# Patient Record
Sex: Female | Born: 1955 | Race: White | Hispanic: No | Smoking: Never smoker
Health system: Southern US, Community
[De-identification: ages and names within clinical notes are randomized; demographics above are authoritative.]

## PROBLEM LIST (undated history)

## (undated) DIAGNOSIS — R011 Cardiac murmur, unspecified: Secondary | ICD-10-CM

## (undated) DIAGNOSIS — I499 Cardiac arrhythmia, unspecified: Secondary | ICD-10-CM

## (undated) DIAGNOSIS — E669 Obesity, unspecified: Secondary | ICD-10-CM

## (undated) DIAGNOSIS — IMO0001 Reserved for inherently not codable concepts without codable children: Secondary | ICD-10-CM

## (undated) DIAGNOSIS — I350 Nonrheumatic aortic (valve) stenosis: Secondary | ICD-10-CM

## (undated) DIAGNOSIS — I10 Essential (primary) hypertension: Secondary | ICD-10-CM

## (undated) DIAGNOSIS — E66811 Obesity, class 1: Secondary | ICD-10-CM

## (undated) DIAGNOSIS — I509 Heart failure, unspecified: Secondary | ICD-10-CM

## (undated) HISTORY — PX: TUBAL LIGATION: SHX77

## (undated) HISTORY — DX: Cardiac arrhythmia, unspecified: I49.9

## (undated) HISTORY — DX: Heart failure, unspecified: I50.9

## (undated) HISTORY — PX: HERNIA REPAIR: SHX51

## (undated) HISTORY — DX: Cardiac murmur, unspecified: R01.1

## (undated) HISTORY — DX: Essential (primary) hypertension: I10

## (undated) HISTORY — PX: CHOLECYSTECTOMY: SHX55

## (undated) HISTORY — DX: Nonrheumatic aortic (valve) stenosis: I35.0

---

## 2001-02-16 ENCOUNTER — Encounter: Payer: Self-pay | Admitting: Emergency Medicine

## 2001-02-16 ENCOUNTER — Inpatient Hospital Stay (HOSPITAL_COMMUNITY): Admission: EM | Admit: 2001-02-16 | Discharge: 2001-02-20 | Payer: Self-pay | Admitting: Emergency Medicine

## 2001-02-17 ENCOUNTER — Encounter: Payer: Self-pay | Admitting: General Surgery

## 2001-02-19 ENCOUNTER — Encounter: Payer: Self-pay | Admitting: General Surgery

## 2001-11-02 ENCOUNTER — Emergency Department (HOSPITAL_COMMUNITY): Admission: EM | Admit: 2001-11-02 | Discharge: 2001-11-02 | Payer: Self-pay | Admitting: Emergency Medicine

## 2001-11-02 ENCOUNTER — Encounter: Payer: Self-pay | Admitting: Emergency Medicine

## 2005-09-19 ENCOUNTER — Emergency Department (HOSPITAL_COMMUNITY): Admission: EM | Admit: 2005-09-19 | Discharge: 2005-09-19 | Payer: Self-pay | Admitting: Emergency Medicine

## 2012-03-20 DIAGNOSIS — IMO0001 Reserved for inherently not codable concepts without codable children: Secondary | ICD-10-CM

## 2012-03-20 HISTORY — DX: Reserved for inherently not codable concepts without codable children: IMO0001

## 2012-03-24 ENCOUNTER — Encounter (HOSPITAL_COMMUNITY): Payer: Self-pay | Admitting: Emergency Medicine

## 2012-03-24 ENCOUNTER — Inpatient Hospital Stay (HOSPITAL_COMMUNITY)
Admission: EM | Admit: 2012-03-24 | Discharge: 2012-04-14 | DRG: 870 | Payer: MEDICAID | Attending: Pulmonary Disease | Admitting: Pulmonary Disease

## 2012-03-24 ENCOUNTER — Emergency Department (HOSPITAL_COMMUNITY): Payer: Self-pay

## 2012-03-24 ENCOUNTER — Emergency Department (INDEPENDENT_AMBULATORY_CARE_PROVIDER_SITE_OTHER)
Admission: EM | Admit: 2012-03-24 | Discharge: 2012-03-24 | Disposition: A | Discharge status: Nursing Home (Includes ICF) | Payer: Self-pay | Source: Home / Self Care | Attending: Emergency Medicine | Admitting: Emergency Medicine

## 2012-03-24 ENCOUNTER — Emergency Department (INDEPENDENT_AMBULATORY_CARE_PROVIDER_SITE_OTHER): Payer: Self-pay

## 2012-03-24 DIAGNOSIS — D649 Anemia, unspecified: Secondary | ICD-10-CM | POA: Diagnosis present

## 2012-03-24 DIAGNOSIS — R0902 Hypoxemia: Secondary | ICD-10-CM

## 2012-03-24 DIAGNOSIS — R5383 Other fatigue: Secondary | ICD-10-CM

## 2012-03-24 DIAGNOSIS — R197 Diarrhea, unspecified: Secondary | ICD-10-CM | POA: Diagnosis present

## 2012-03-24 DIAGNOSIS — I10 Essential (primary) hypertension: Secondary | ICD-10-CM | POA: Diagnosis present

## 2012-03-24 DIAGNOSIS — E669 Obesity, unspecified: Secondary | ICD-10-CM | POA: Diagnosis present

## 2012-03-24 DIAGNOSIS — R5381 Other malaise: Secondary | ICD-10-CM | POA: Diagnosis not present

## 2012-03-24 DIAGNOSIS — E139 Other specified diabetes mellitus without complications: Secondary | ICD-10-CM | POA: Diagnosis present

## 2012-03-24 DIAGNOSIS — J101 Influenza due to other identified influenza virus with other respiratory manifestations: Secondary | ICD-10-CM | POA: Diagnosis present

## 2012-03-24 DIAGNOSIS — E8779 Other fluid overload: Secondary | ICD-10-CM | POA: Diagnosis not present

## 2012-03-24 DIAGNOSIS — D72819 Decreased white blood cell count, unspecified: Secondary | ICD-10-CM | POA: Insufficient documentation

## 2012-03-24 DIAGNOSIS — J9600 Acute respiratory failure, unspecified whether with hypoxia or hypercapnia: Secondary | ICD-10-CM | POA: Diagnosis not present

## 2012-03-24 DIAGNOSIS — N179 Acute kidney failure, unspecified: Secondary | ICD-10-CM | POA: Diagnosis not present

## 2012-03-24 DIAGNOSIS — R1310 Dysphagia, unspecified: Secondary | ICD-10-CM | POA: Diagnosis not present

## 2012-03-24 DIAGNOSIS — J09X1 Influenza due to identified novel influenza A virus with pneumonia: Secondary | ICD-10-CM | POA: Diagnosis present

## 2012-03-24 DIAGNOSIS — F411 Generalized anxiety disorder: Secondary | ICD-10-CM | POA: Diagnosis present

## 2012-03-24 DIAGNOSIS — J80 Acute respiratory distress syndrome: Secondary | ICD-10-CM | POA: Diagnosis present

## 2012-03-24 DIAGNOSIS — R05 Cough: Secondary | ICD-10-CM

## 2012-03-24 DIAGNOSIS — E870 Hyperosmolality and hypernatremia: Secondary | ICD-10-CM | POA: Diagnosis present

## 2012-03-24 DIAGNOSIS — D509 Iron deficiency anemia, unspecified: Secondary | ICD-10-CM | POA: Diagnosis present

## 2012-03-24 DIAGNOSIS — R6520 Severe sepsis without septic shock: Secondary | ICD-10-CM | POA: Diagnosis present

## 2012-03-24 DIAGNOSIS — N289 Disorder of kidney and ureter, unspecified: Secondary | ICD-10-CM | POA: Diagnosis not present

## 2012-03-24 DIAGNOSIS — R112 Nausea with vomiting, unspecified: Secondary | ICD-10-CM

## 2012-03-24 DIAGNOSIS — J189 Pneumonia, unspecified organism: Secondary | ICD-10-CM | POA: Insufficient documentation

## 2012-03-24 DIAGNOSIS — R531 Weakness: Secondary | ICD-10-CM

## 2012-03-24 DIAGNOSIS — Z6841 Body Mass Index (BMI) 40.0 and over, adult: Secondary | ICD-10-CM

## 2012-03-24 DIAGNOSIS — E876 Hypokalemia: Secondary | ICD-10-CM | POA: Diagnosis not present

## 2012-03-24 DIAGNOSIS — IMO0002 Reserved for concepts with insufficient information to code with codable children: Secondary | ICD-10-CM | POA: Diagnosis not present

## 2012-03-24 DIAGNOSIS — R6521 Severe sepsis with septic shock: Secondary | ICD-10-CM | POA: Diagnosis present

## 2012-03-24 DIAGNOSIS — R739 Hyperglycemia, unspecified: Secondary | ICD-10-CM | POA: Diagnosis not present

## 2012-03-24 DIAGNOSIS — J9601 Acute respiratory failure with hypoxia: Secondary | ICD-10-CM

## 2012-03-24 DIAGNOSIS — G9349 Other encephalopathy: Secondary | ICD-10-CM | POA: Diagnosis not present

## 2012-03-24 DIAGNOSIS — J069 Acute upper respiratory infection, unspecified: Secondary | ICD-10-CM

## 2012-03-24 DIAGNOSIS — R061 Stridor: Secondary | ICD-10-CM | POA: Diagnosis not present

## 2012-03-24 DIAGNOSIS — A419 Sepsis, unspecified organism: Principal | ICD-10-CM | POA: Diagnosis present

## 2012-03-24 DIAGNOSIS — T380X5A Adverse effect of glucocorticoids and synthetic analogues, initial encounter: Secondary | ICD-10-CM | POA: Diagnosis present

## 2012-03-24 HISTORY — DX: Reserved for inherently not codable concepts without codable children: IMO0001

## 2012-03-24 HISTORY — DX: Obesity, unspecified: E66.9

## 2012-03-24 HISTORY — DX: Obesity, class 1: E66.811

## 2012-03-24 HISTORY — DX: Anemia, unspecified: D64.9

## 2012-03-24 LAB — CBC WITH DIFFERENTIAL/PLATELET
Basophils Absolute: 0 10*3/uL (ref 0.0–0.1)
Lymphs Abs: 0.5 10*3/uL — ABNORMAL LOW (ref 0.7–4.0)
MCV: 62.6 fL — ABNORMAL LOW (ref 78.0–100.0)
Monocytes Absolute: 0.2 10*3/uL (ref 0.1–1.0)
Monocytes Relative: 7 % (ref 3–12)
Neutrophils Relative %: 75 % (ref 43–77)
Platelets: 220 10*3/uL (ref 150–400)
RDW: 20.1 % — ABNORMAL HIGH (ref 11.5–15.5)
WBC: 2.8 10*3/uL — ABNORMAL LOW (ref 4.0–10.5)

## 2012-03-24 LAB — URINALYSIS, ROUTINE W REFLEX MICROSCOPIC
Hgb urine dipstick: NEGATIVE
Nitrite: NEGATIVE
Protein, ur: 100 mg/dL — AB
Urobilinogen, UA: 2 mg/dL — ABNORMAL HIGH (ref 0.0–1.0)

## 2012-03-24 LAB — BASIC METABOLIC PANEL
Calcium: 8.3 mg/dL — ABNORMAL LOW (ref 8.4–10.5)
Chloride: 101 mEq/L (ref 96–112)
Creatinine, Ser: 1.06 mg/dL (ref 0.50–1.10)
GFR calc Af Amer: 67 mL/min — ABNORMAL LOW (ref >90)

## 2012-03-24 LAB — OCCULT BLOOD, POC DEVICE: Fecal Occult Bld: NEGATIVE

## 2012-03-24 LAB — LIPASE, BLOOD: Lipase: 48 U/L (ref 11–59)

## 2012-03-24 LAB — TROPONIN I: Troponin I: <0.3 ng/mL (ref <0.30)

## 2012-03-24 MED ORDER — ACETAMINOPHEN 325 MG PO TABS
ORAL_TABLET | ORAL | Status: AC
  Filled 2012-03-24: qty 1

## 2012-03-24 MED ORDER — POTASSIUM CHLORIDE CRYS ER 20 MEQ PO TBCR
40.0000 meq | EXTENDED_RELEASE_TABLET | Freq: Once | ORAL | Status: AC
  Administered 2012-03-24: 40 meq via ORAL
  Filled 2012-03-24: qty 2

## 2012-03-24 MED ORDER — ALBUTEROL SULFATE (5 MG/ML) 0.5% IN NEBU
2.5000 mg | INHALATION_SOLUTION | Freq: Once | RESPIRATORY_TRACT | Status: AC
  Administered 2012-03-24: 2.5 mg via RESPIRATORY_TRACT

## 2012-03-24 MED ORDER — ACETAMINOPHEN 325 MG PO TABS
650.0000 mg | ORAL_TABLET | Freq: Once | ORAL | Status: AC
  Administered 2012-03-24: 650 mg via ORAL

## 2012-03-24 MED ORDER — NON FORMULARY: 400.0000 mg | Freq: Every day | Status: DC

## 2012-03-24 MED ORDER — LEVOFLOXACIN IN D5W 750 MG/150ML IV SOLN
750.0000 mg | Freq: Once | INTRAVENOUS | Status: AC
  Administered 2012-03-24: 750 mg via INTRAVENOUS
  Filled 2012-03-24: qty 150

## 2012-03-24 MED ORDER — IPRATROPIUM BROMIDE 0.02 % IN SOLN
0.5000 mg | Freq: Once | RESPIRATORY_TRACT | Status: AC
  Administered 2012-03-24: 0.5 mg via RESPIRATORY_TRACT

## 2012-03-24 MED ORDER — ALBUTEROL SULFATE (5 MG/ML) 0.5% IN NEBU
INHALATION_SOLUTION | RESPIRATORY_TRACT | Status: AC
  Filled 2012-03-24: qty 0.5

## 2012-03-24 MED ORDER — SODIUM CHLORIDE 0.9 % IV BOLUS (SEPSIS)
1000.0000 mL | Freq: Once | INTRAVENOUS | Status: AC
  Administered 2012-03-24: 1000 mL via INTRAVENOUS

## 2012-03-24 NOTE — ED Notes (Signed)
WENT TO GET PATIENT FOR CXR, PATIENT JUST STARTED BREATHING TREATMENT

## 2012-03-24 NOTE — ED Notes (Signed)
O2 4 L /Williams, monitor shows sinus tachycardia.

## 2012-03-24 NOTE — ED Notes (Signed)
carelink not available. [facility] notified. Mw,cma

## 2012-03-24 NOTE — ED Provider Notes (Signed)
History     CSN: [id number]  Arrival date & time 03/24/12  1552   None     Chief Complaint  Patient presents with  . URI    dry non productive cough, body aches, n/v/d    (Consider location/radiation/quality/duration/timing/severity/associated sxs/prior treatment) Patient is a 56 y.o. female presenting with cough. The history is provided by the patient.  Cough This is a new problem. The current episode started more than 2 days ago (4 days ago). The problem occurs hourly. The problem has not changed since onset.The cough is non-productive. The maximum temperature recorded prior to her arrival was 103 to 104 F. Associated symptoms include chest pain, chills, sweats, headaches, myalgias, shortness of breath and wheezing. She has tried decongestants and cough syrup (otc robitussin, alka seltzer plus) for the symptoms. The treatment provided mild relief. Risk factors: no recent travel, dogs at home, no chemical exposure. She is not a smoker. Her past medical history does not include bronchitis, pneumonia, bronchiectasis, COPD, emphysema or asthma.  Pt reports she has been weak and resting in bed since Saturday, otc medication do not improve symptoms.   History reviewed. No pertinent past medical history.  Past Surgical History  Procedure Date  . Cholecystectomy   . Hernia repair     History reviewed. No pertinent family history.  History  Substance Use Topics  . Smoking status: Never Smoker   . Smokeless tobacco: Not on file  . Alcohol Use: Yes     Comment: occasional    OB History    Grav Para Term Preterm Abortions TAB SAB Ect Mult Living                  Review of Systems  Constitutional: Positive for chills.  Respiratory: Positive for cough, shortness of breath and wheezing.   Cardiovascular: Positive for chest pain.  Gastrointestinal: Positive for nausea.  Musculoskeletal: Positive for myalgias.  Neurological: Positive for headaches.  All other systems reviewed and  are negative.    Allergies  Review of patient's allergies indicates no known allergies.  Home Medications   Current Outpatient Rx  Name  Route  Sig  Dispense  Refill  . PRILOSEC PO   Oral   Take by mouth.           BP 135/79  Pulse 117  Temp 101.4 F (38.6 C) (Oral)  Resp 30  SpO2 95%  Physical Exam  Nursing note and vitals reviewed. Constitutional: She appears well-developed and well-nourished. No distress.  HENT:  Head: Normocephalic.  Right Ear: External ear normal.  Left Ear: External ear normal.  Nose: Nose normal.  Mouth/Throat: Oropharynx is clear and moist. No oropharyngeal exudate.  Eyes: Conjunctivae normal are normal. Pupils are equal, round, and reactive to light.  Neck: Normal range of motion. Neck supple.  Cardiovascular: Normal heart sounds, intact distal pulses and normal pulses.  Tachycardia present.   Pulmonary/Chest: No accessory muscle usage. Tachypnea noted. No respiratory distress. She has decreased breath sounds.  Abdominal: Soft. Bowel sounds are normal. There is no tenderness.  Lymphadenopathy:       Head (right side): No submental, no submandibular, no tonsillar, no preauricular, no posterior auricular and no occipital adenopathy present.       Head (left side): No submental, no submandibular, no tonsillar, no preauricular, no posterior auricular and no occipital adenopathy present.    She has no cervical adenopathy.  Neurological: She is alert. She has normal strength. No cranial nerve deficit or sensory  deficit. Coordination and gait normal. GCS eye subscore is 4. GCS verbal subscore is 5. GCS motor subscore is 6.  Skin: She is not diaphoretic.    ED Course  Procedures (including critical care time)  Labs Reviewed - No data to display Dg Chest 1 View  03/24/2012  *RADIOLOGY REPORT*  Clinical Data: Shortness of breath.  Dizziness.  CHEST - 1 VIEW  Comparison: None.  Findings: Bilateral heterogeneous air space disease is seen in the mid  and lower lung zones, left side greater than right.  The differential diagnosis includes infection, edema, and hemorrhage.  No evidence of pleural effusion.  Heart size is normal.  Low lung volumes are noted.  IMPRESSION: Bilateral heterogeneous air space disease, left side greater than right.  Differential diagnosis includes pneumonia, edema, and hemorrhage.   Original Report Authenticated By: Myles Rosenthal, M.D.     EKG-tachycardia, ST and T wave abnormality  1. Hypoxia   2. Cough   3. Weakness       MDM  Tylenol and Albuterol/atrovent neb treatment administered in office.  1838 re-eval, rhonchi in right lower field, 02 sats 93% on room air while sitting. 1846 attempt to obtain xray by xray tech, pt unable to tolerate related to near syncope episode x 2, one view obtained only. Will transfer via carelink to [facility] for further evaluation and treatment.         Johnsie Kindred, NP 03/24/12 1925

## 2012-03-24 NOTE — H&P (Signed)
PCP:   none   Chief Complaint:  cough  HPI: This is a 56 year old female who states she's been feeling ill since Saturday. She states she's had a nonproductive cough. No hemoptysis. Her cough has been severe enough there is no causing her chest and back to her hurt.she denies any shortness of breath or wheezing. She states she's had a temperature of 102 Max, she's had mild chills but no nausea or vomiting. She denies feeling weak, she denies any myalgia. She denies any blood in stool or urine. She denies any lower extremity edema or any weight gain or loss. She denies any chest pain. She states she does have history of anemia but that was on during pregnancy, she's never had a blood transfusion. Patient is postmenopausal for 6-7 years, she's never had a colonoscopy. No history of blood in her stool, she states her stool is brown. Her over-the-counter medications are Robitussin, Alka-Seltzer and ibuprofen. She does have a son with Ewing sarcoma who was treated at [facility]. She's been stressed going back and forth with him. There is no history of any weight loss. History provided by the patient is alert and oriented.patient initially to urgent care Center, however she had 2 presyncopal episodes while thereas he took her to get a chest x-ray done. She was sent to [facility]. History obtained from patient.  Review of Systems:  The patient denies anorexia, weight loss,, vision loss, decreased hearing, hoarseness, chest pain, syncope, dyspnea on exertion, peripheral edema, balance deficits, hemoptysis, abdominal pain, melena, hematochezia, severe indigestion/heartburn, hematuria, incontinence, genital sores, muscle weakness, suspicious skin lesions, transient blindness, difficulty walking, depression, unusual weight change, abnormal bleeding, enlarged lymph nodes, angioedema, and breast masses.  Past Medical History: Past Medical History  Diagnosis Date  . Obesity (BMI 30.0-34.9)    Past Surgical History   Procedure Date  . Cholecystectomy   . Hernia repair     Medications: Prior to Admission medications   Medication Sig Start Date End Date Taking? Authorizing Provider  Omeprazole (PRILOSEC PO) Take 1 tablet by mouth daily.    Yes Historical Provider, MD    Allergies:   Allergies  Allergen Reactions  . Penicillins Swelling  . Codeine Nausea And Vomiting    Social History:  reports that she has never smoked. She does not have any smokeless tobacco history on file. She reports that she drinks alcohol. She reports that she does not use illicit drugs.  Family History: Family History  Problem Relation Age of Onset  . Hypertension      Physical Exam: Filed Vitals:   03/24/12 1943 03/24/12 2256  BP:  126/76  Pulse: 97 91  Resp: 32 30  SpO2: 95% 93%    General:  Alert and oriented times three, morbidly obese no acute distress Eyes: PERRLA, pale conjunctiva, no scleral icterus ENT: Moist oral mucosa, neck supple, no thyromegaly Lungs: clear to ascultation, no wheeze, no crackles, no use of accessory muscles Cardiovascular: regular rate and rhythm, no regurgitation, no gallops, no murmurs. No carotid bruits, no JVD Abdomen: soft, positive BS, non-tender, non-distended, no organomegaly, not an acute abdomen GU: not examined Neuro: CN II - XII grossly intact, sensation intact Musculoskeletal: strength 5/5 all extremities, no clubbing, cyanosis or edema Skin: no rash, no subcutaneous crepitation, no decubitus Psych: appropriate patient   Labs on Admission:   [facility] [place] 03/24/12 2021  NA 139  K 3.1*  CL 101  CO2 27  GLUCOSE 115*  BUN 14  CREATININE 1.06  CALCIUM  8.3*  MG --  PHOS --   No results found for this basename: AST:2,ALT:2,ALKPHOS:2,BILITOT:2,PROT:2,ALBUMIN:2 in the last 72 hours  Basename 03/24/12 2021  LIPASE 48  AMYLASE --    Basename 03/24/12 2021  WBC 2.8*  NEUTROABS 2.1  HGB 7.7*  HCT 25.3*  MCV 62.6*  PLT 220    Basename 03/24/12 2021   CKTOTAL --  CKMB --  CKMBINDEX --  TROPONINI <0.30     Basename 03/24/12 2323  VITAMINB12 --  FOLATE --  FERRITIN --  TIBC --  IRON --  RETICCTPCT 0.3*    Micro Results: No results found for this or any previous visit (from the past 240 hour(s)).   Radiological Exams on Admission: Dg Chest 1 View  03/24/2012  *RADIOLOGY REPORT*  Clinical Data: Chest pain and shortness of breath.  CHEST - 1 VIEW  Comparison: Chest radiograph performed earlier today at 06:41 p.m.  Findings: Basilar opacity and additional midlung opacity is seen on lateral chest radiograph, corresponding to the bilateral airspace opacities noted on the PA radiograph.  Findings are more suspicious for pneumonia, though edema could conceivably have a similar appearance.  Hemorrhage is considered less likely.  The cardiomediastinal silhouette is borderline normal in size.  No acute osseous abnormalities are seen.  IMPRESSION: Bilateral airspace opacities, suspicious for pneumonia.  Edema could conceivably have a similar appearance.   Original Report Authenticated By: Tonia Ghent, M.D.    Dg Chest 1 View  03/24/2012  *RADIOLOGY REPORT*  Clinical Data: Shortness of breath.  Dizziness.  CHEST - 1 VIEW  Comparison: None.  Findings: Bilateral heterogeneous air space disease is seen in the mid and lower lung zones, left side greater than right.  The differential diagnosis includes infection, edema, and hemorrhage.  No evidence of pleural effusion.  Heart size is normal.  Low lung volumes are noted.  IMPRESSION: Bilateral heterogeneous air space disease, left side greater than right.  Differential diagnosis includes pneumonia, edema, and hemorrhage.   Original Report Authenticated By: Myles Rosenthal, M.D.    EKG: Normal sinus rhythm  Assessment/Plan Present on Admission:  . Community acquired pneumonia/leukocytosis Admit to telemetry Will start patient on antibiotics Levaquin. Tessalon Perles, Nebulizers as needed and oxygen as  needed No evidence of hemoptysis. Will obtain a CT chest with contrast  Blood cultures and sputum cultures ordered Anemia Unclear etiology. Patient is postmenopausal, gives no history of blood in the stool, not coughing up blood no blood in the urine. Anemia panel ordered and will guaiac stool. Will order CEA, patient encouraged to get a colonoscopy done Will transfuse for symptomatic anemia as patient is presyncopal. Obesity  Full code SCDs for DVT prophylaxis  Maniyah Moller 03/24/2012, 11:47 PM

## 2012-03-24 NOTE — ED Provider Notes (Signed)
History     CSN: [id number]  Arrival date & time 03/24/12  8295   First MD Initiated Contact with Patient 03/24/12 2018      Chief Complaint  Patient presents with  . Near Syncope    (Consider location/radiation/quality/duration/timing/severity/associated sxs/prior treatment) The history is provided by the patient, the spouse and medical records.   Megan Espinoza is a 55 y.o. female  with no past medical history presents to the Emergency Department from the urgent care complaining of sudden onset, resolved nursing about this and onset 30 minutes ago.  Patient states she presented to the urgent care because she thought she might have pneumonia as she's had upper respiratory symptoms for almost one week.  Associated symptoms include fever, chills, fatigue, appetite loss, headache, chest tightness, cough, congestion, nausea, vomiting, diarrhea.  At the urgent care patient stood up her chest x-ray taken and became sweaty, pale and very lightheaded.  Patient states she thought she might pass out at that time.  Nothing makes it better and nothing makes it worse.  Pt denies neck pain, abdominal pain, dysuria, hematuria, urgency, frequency, bloody stools, hemoptysis.     Past Medical History  Diagnosis Date  . Obesity (BMI 30.0-34.9)     Past Surgical History  Procedure Date  . Cholecystectomy   . Hernia repair     Family History  Problem Relation Age of Onset  . Hypertension      History  Substance Use Topics  . Smoking status: Never Smoker   . Smokeless tobacco: Not on file  . Alcohol Use: Yes     Comment: occasional    OB History    Grav Para Term Preterm Abortions TAB SAB Ect Mult Living                  Review of Systems  Constitutional: Positive for fever, chills, appetite change and fatigue. Negative for diaphoresis and unexpected weight change.  HENT: Negative for mouth sores, neck pain and neck stiffness.   Eyes: Negative for visual disturbance.    Respiratory: Positive for cough, chest tightness and shortness of breath. Negative for wheezing.   Cardiovascular: Negative for chest pain.  Gastrointestinal: Positive for nausea, vomiting and diarrhea. Negative for abdominal pain and constipation.  Genitourinary: Negative for dysuria, urgency, frequency and hematuria.  Musculoskeletal: Positive for back pain.  Skin: Positive for pallor. Negative for rash and wound.  Neurological: Negative for syncope, light-headedness and headaches.  Hematological: Does not bruise/bleed easily.  Psychiatric/Behavioral: Negative for sleep disturbance. The patient is not nervous/anxious.     Allergies  Penicillins and Codeine  Home Medications   Current Outpatient Rx  Name  Route  Sig  Dispense  Refill  . PRILOSEC PO   Oral   Take 1 tablet by mouth daily.            BP 119/64  Pulse 92  Temp 98.9 F (37.2 C) (Oral)  Resp 18  Ht 5\' 5"  (1.651 m)  Wt 270 lb (122.471 kg)  BMI 44.93 kg/m2  SpO2 96%  Physical Exam  Nursing note and vitals reviewed. Constitutional: She is oriented to person, place, and time. She appears well-developed and well-nourished. No distress.  HENT:  Head: Normocephalic and atraumatic.  Right Ear: Tympanic membrane, external ear and ear canal normal.  Left Ear: Tympanic membrane, external ear and ear canal normal.  Nose: Nose normal.  Mouth/Throat: Uvula is midline and oropharynx is clear and moist. Mucous membranes are pale. No oropharyngeal  exudate.  Eyes: Conjunctivae normal and EOM are normal. Pupils are equal, round, and reactive to light. No scleral icterus.  Neck: Normal range of motion. Neck supple.  Cardiovascular: Normal rate, regular rhythm, normal heart sounds and intact distal pulses.  Exam reveals no gallop and no friction rub.   No murmur heard. Pulmonary/Chest: Accessory muscle usage present. Tachypnea noted. No respiratory distress. She has decreased breath sounds (throughout). She has wheezes  (scattered throughout).  Abdominal: Soft. Bowel sounds are normal. She exhibits no distension and no mass. There is no tenderness. There is no rebound and no guarding.       Obese  Genitourinary: Rectal exam shows no external hemorrhoid, no internal hemorrhoid, no fissure, no mass, no tenderness and anal tone normal. Guaiac negative stool.     Musculoskeletal: Normal range of motion. She exhibits no edema and no tenderness.  Lymphadenopathy:    She has no cervical adenopathy.  Neurological: She is alert and oriented to person, place, and time. She exhibits normal muscle tone. Coordination normal.       Speech is clear and goal oriented, follows commands Major Cranial nerves without deficit, no facial droop Normal strength in upper and lower extremities bilaterally including dorsiflexion and plantar flexion, strong and equal grip strength Sensation normal to light and sharp touch Moves extremities without ataxia, coordination intact Normal finger to nose and rapid alternating movements Neg modified romberg, no pronator drift   Skin: Skin is warm and dry. No rash noted. She is not diaphoretic. No erythema. There is pallor.  Psychiatric: She has a normal mood and affect.    ED Course  Procedures (including critical care time)  Labs Reviewed  URINALYSIS, ROUTINE W REFLEX MICROSCOPIC - Abnormal; Notable for the following:    Color, Urine AMBER (*)  BIOCHEMICALS MAY BE AFFECTED BY COLOR   APPearance CLOUDY (*)     Specific Gravity, Urine 1.034 (*)     Bilirubin Urine SMALL (*)     Ketones, ur 15 (*)     Protein, ur 100 (*)     Urobilinogen, UA 2.0 (*)     Leukocytes, UA SMALL (*)     All other components within normal limits  CBC WITH DIFFERENTIAL - Abnormal; Notable for the following:    WBC 2.8 (*)     Hemoglobin 7.7 (*)     HCT 25.3 (*)     MCV 62.6 (*)     MCH 19.1 (*)     RDW 20.1 (*)     Lymphs Abs 0.5 (*)     All other components within normal limits  BASIC METABOLIC  PANEL - Abnormal; Notable for the following:    Potassium 3.1 (*)     Glucose, Bld 115 (*)     Calcium 8.3 (*)     GFR calc non Af Amer 58 (*)     GFR calc Af Amer 67 (*)     All other components within normal limits  URINE MICROSCOPIC-ADD ON - Abnormal; Notable for the following:    Squamous Epithelial / LPF FEW (*)     Bacteria, UA FEW (*)     Casts HYALINE CASTS (*)     All other components within normal limits  PRO B NATRIURETIC PEPTIDE - Abnormal; Notable for the following:    Pro B Natriuretic peptide (BNP) 155.5 (*)     All other components within normal limits  RETICULOCYTES - Abnormal; Notable for the following:    Retic Ct Pct  0.3 (*)     RBC. 3.85 (*)     Retic Count, Manual 11.6 (*)     All other components within normal limits  TROPONIN I  LIPASE, BLOOD  TYPE AND SCREEN  OCCULT BLOOD, POC DEVICE  URINE CULTURE  CULTURE, BLOOD (ROUTINE X 2)  CULTURE, BLOOD (ROUTINE X 2)  OCCULT BLOOD X 1 CARD TO LAB, STOOL  VITAMIN B12  FOLATE  IRON AND TIBC  FERRITIN  ABO/RH   Dg Chest 1 View  03/24/2012  *RADIOLOGY REPORT*  Clinical Data: Chest pain and shortness of breath.  CHEST - 1 VIEW  Comparison: Chest radiograph performed earlier today at 06:41 p.m.  Findings: Basilar opacity and additional midlung opacity is seen on lateral chest radiograph, corresponding to the bilateral airspace opacities noted on the PA radiograph.  Findings are more suspicious for pneumonia, though edema could conceivably have a similar appearance.  Hemorrhage is considered less likely.  The cardiomediastinal silhouette is borderline normal in size.  No acute osseous abnormalities are seen.  IMPRESSION: Bilateral airspace opacities, suspicious for pneumonia.  Edema could conceivably have a similar appearance.   Original Report Authenticated By: Tonia Ghent, M.D.    Dg Chest 1 View  03/24/2012  *RADIOLOGY REPORT*  Clinical Data: Shortness of breath.  Dizziness.  CHEST - 1 VIEW  Comparison: None.   Findings: Bilateral heterogeneous air space disease is seen in the mid and lower lung zones, left side greater than right.  The differential diagnosis includes infection, edema, and hemorrhage.  No evidence of pleural effusion.  Heart size is normal.  Low lung volumes are noted.  IMPRESSION: Bilateral heterogeneous air space disease, left side greater than right.  Differential diagnosis includes pneumonia, edema, and hemorrhage.   Original Report Authenticated By: Myles Rosenthal, M.D.    Results for orders placed during the hospital encounter of 03/24/12  TROPONIN I      Component Value Range   Troponin I <0.30  <0.30 ng/mL  URINALYSIS, ROUTINE W REFLEX MICROSCOPIC      Component Value Range   Color, Urine AMBER (*) YELLOW   APPearance CLOUDY (*) CLEAR   Specific Gravity, Urine 1.034 (*) 1.005 - 1.030   pH 5.5  5.0 - 8.0   Glucose, UA NEGATIVE  NEGATIVE mg/dL   Hgb urine dipstick NEGATIVE  NEGATIVE   Bilirubin Urine SMALL (*) NEGATIVE   Ketones, ur 15 (*) NEGATIVE mg/dL   Protein, ur 045 (*) NEGATIVE mg/dL   Urobilinogen, UA 2.0 (*) 0.0 - 1.0 mg/dL   Nitrite NEGATIVE  NEGATIVE   Leukocytes, UA SMALL (*) NEGATIVE  CBC WITH DIFFERENTIAL      Component Value Range   WBC 2.8 (*) 4.0 - 10.5 K/uL   RBC 4.04  3.87 - 5.11 MIL/uL   Hemoglobin 7.7 (*) 12.0 - 15.0 g/dL   HCT 40.9 (*) 81.1 - 91.4 %   MCV 62.6 (*) 78.0 - 100.0 fL   MCH 19.1 (*) 26.0 - 34.0 pg   MCHC 30.4  30.0 - 36.0 g/dL   RDW 78.2 (*) 95.6 - 21.3 %   Platelets 220  150 - 400 K/uL   Neutrophils Relative 75  43 - 77 %   Lymphocytes Relative 18  12 - 46 %   Monocytes Relative 7  3 - 12 %   Eosinophils Relative 0  0 - 5 %   Basophils Relative 0  0 - 1 %   Neutro Abs 2.1  1.7 - 7.7 K/uL  Lymphs Abs 0.5 (*) 0.7 - 4.0 K/uL   Monocytes Absolute 0.2  0.1 - 1.0 K/uL   Eosinophils Absolute 0.0  0.0 - 0.7 K/uL   Basophils Absolute 0.0  0.0 - 0.1 K/uL   RBC Morphology ELLIPTOCYTES    BASIC METABOLIC PANEL      Component Value Range     Sodium 139  135 - 145 mEq/L   Potassium 3.1 (*) 3.5 - 5.1 mEq/L   Chloride 101  96 - 112 mEq/L   CO2 27  19 - 32 mEq/L   Glucose, Bld 115 (*) 70 - 99 mg/dL   BUN 14  6 - 23 mg/dL   Creatinine, Ser 1.61  0.50 - 1.10 mg/dL   Calcium 8.3 (*) 8.4 - 10.5 mg/dL   GFR calc non Af Amer 58 (*) >90 mL/min   GFR calc Af Amer 67 (*) >90 mL/min  LIPASE, BLOOD      Component Value Range   Lipase 48  11 - 59 U/L  URINE MICROSCOPIC-ADD ON      Component Value Range   Squamous Epithelial / LPF FEW (*) RARE   WBC, UA 7-10  <3 WBC/hpf   RBC / HPF 3-6  <3 RBC/hpf   Bacteria, UA FEW (*) RARE   Casts HYALINE CASTS (*) NEGATIVE   Urine-Other MUCOUS PRESENT    PRO B NATRIURETIC PEPTIDE      Component Value Range   Pro B Natriuretic peptide (BNP) 155.5 (*) 0 - 125 pg/mL  TYPE AND SCREEN      Component Value Range   ABO/RH(D) O POS     Antibody Screen NEG     Sample Expiration 03/27/2012    RETICULOCYTES      Component Value Range   Retic Ct Pct 0.3 (*) 0.4 - 3.1 %   RBC. 3.85 (*) 3.87 - 5.11 MIL/uL   Retic Count, Manual 11.6 (*) 19.0 - 186.0 K/uL  OCCULT BLOOD, POC DEVICE      Component Value Range   Fecal Occult Bld NEGATIVE     Dg Chest 1 View  03/24/2012  *RADIOLOGY REPORT*  Clinical Data: Chest pain and shortness of breath.  CHEST - 1 VIEW  Comparison: Chest radiograph performed earlier today at 06:41 p.m.  Findings: Basilar opacity and additional midlung opacity is seen on lateral chest radiograph, corresponding to the bilateral airspace opacities noted on the PA radiograph.  Findings are more suspicious for pneumonia, though edema could conceivably have a similar appearance.  Hemorrhage is considered less likely.  The cardiomediastinal silhouette is borderline normal in size.  No acute osseous abnormalities are seen.  IMPRESSION: Bilateral airspace opacities, suspicious for pneumonia.  Edema could conceivably have a similar appearance.   Original Report Authenticated By: Tonia Ghent, M.D.     Dg Chest 1 View  03/24/2012  *RADIOLOGY REPORT*  Clinical Data: Shortness of breath.  Dizziness.  CHEST - 1 VIEW  Comparison: None.  Findings: Bilateral heterogeneous air space disease is seen in the mid and lower lung zones, left side greater than right.  The differential diagnosis includes infection, edema, and hemorrhage.  No evidence of pleural effusion.  Heart size is normal.  Low lung volumes are noted.  IMPRESSION: Bilateral heterogeneous air space disease, left side greater than right.  Differential diagnosis includes pneumonia, edema, and hemorrhage.   Original Report Authenticated By: Myles Rosenthal, M.D.     ECG:  Date: 03/24/2012  Rate: 92  Rhythm: normal sinus rhythm and premature atrial  contractions (PAC)  QRS Axis: normal  Intervals: normal  ST/T Wave abnormalities: nonspecific ST/T changes  Conduction Disutrbances:none  Narrative Interpretation: borderline ST depression in V5-V6  Old EKG Reviewed: none available   CRITICAL CARE Performed by: Dierdre Forth   Total critical care time:  Critical care time was exclusive of separately billable procedures and treating other patients.  Critical care was necessary to treat or prevent imminent or life-threatening deterioration.  Critical care was time spent personally by me on the following activities: development of treatment plan with patient and/or surrogate as well as nursing, discussions with consultants, evaluation of patient's response to treatment, examination of patient, obtaining history from patient or surrogate, ordering and performing treatments and interventions, ordering and review of laboratory studies, ordering and review of radiographic studies, pulse oximetry and re-evaluation of patient's condition.    1. Anemia   2. Hypokalemia   3. URI (upper respiratory infection)   4. Nausea vomiting and diarrhea       MDM  Megan Espinoza presents from urgent care after near syncopal. On  evaluation patient tachypneic, requiring oxygen to maintain oxygen saturation greater than 94%, vital signs stable, she is hypokalemic, anemic, with leukopenia. Chest x-ray concerning for pneumonia versus edema versus blood.  Troponin negative, ECG borderline ST depression in V5 and V6 without old to compare. Based on patient's status I believe she warrants inpatient admission. I have called the hospitalist and they will consult for admission.  BNP 155, Fecal Occult hemoccult negative.     Dr. Bethann Berkshire was consulted and agrees with the plan.         Dahlia Client Datha Kissinger, PA-C 03/25/12 [phone]

## 2012-03-24 NOTE — ED Notes (Signed)
Pt c/o non productive dry cough, body aches, n/v/d since 11/30. Pt denies fever. Pt has tried robitussin and alka seltzer plus and ibuprofen with no relief of symptoms.

## 2012-03-24 NOTE — ED Provider Notes (Signed)
Medical screening examination/treatment/procedure(s) were performed by non-physician practitioner and as supervising physician I was immediately available for consultation/collaboration.  Rhen Kawecki, M.D.   Nadiyah Zeis C Valentina Alcoser, MD 03/24/12 2233 

## 2012-03-24 NOTE — ED Notes (Addendum)
Pt came to ER from Urgent care b/c staff thought she may have pneumonia and may need antibiotics, staff also felt she had near syncopal events x2 while in their care with sx of diaphoresis, pale, sob, pt denies near LOC. Pt's respirations shallow and fast 28-32bpm. A&Ox4, ambulatory, nad. Pt c/o pain to chest and back d/t coughing since Saturday. Pt states N/V/D with little to eat since Saturday.

## 2012-03-24 NOTE — ED Notes (Signed)
CALLED NO ANSWER

## 2012-03-24 NOTE — ED Notes (Signed)
Per EMS: Pt was in urgent care, staff felt she was near syncope x2 during urgent care visit, sent to ER, pt denies almost LOC. EKG from urgent care NSR. CBG: 111, BP 116/54, O2 dropped to 91, placed on 4L increased to 95%, RR 28-30 shallow, pt denies sob. 20G Right A/C. Pt A&Ox4, ambulatory, nad.

## 2012-03-25 ENCOUNTER — Encounter (HOSPITAL_COMMUNITY): Payer: Self-pay

## 2012-03-25 ENCOUNTER — Inpatient Hospital Stay (HOSPITAL_COMMUNITY): Payer: Self-pay

## 2012-03-25 DIAGNOSIS — D72819 Decreased white blood cell count, unspecified: Secondary | ICD-10-CM

## 2012-03-25 DIAGNOSIS — D509 Iron deficiency anemia, unspecified: Secondary | ICD-10-CM

## 2012-03-25 DIAGNOSIS — J189 Pneumonia, unspecified organism: Secondary | ICD-10-CM

## 2012-03-25 LAB — COMPREHENSIVE METABOLIC PANEL
Albumin: 2.6 g/dL — ABNORMAL LOW (ref 3.5–5.2)
BUN: 15 mg/dL (ref 6–23)
Calcium: 8.4 mg/dL (ref 8.4–10.5)
Creatinine, Ser: 0.76 mg/dL (ref 0.50–1.10)
Total Protein: 6.9 g/dL (ref 6.0–8.3)

## 2012-03-25 LAB — PRO B NATRIURETIC PEPTIDE: Pro B Natriuretic peptide (BNP): 155.5 pg/mL — ABNORMAL HIGH (ref 0–125)

## 2012-03-25 LAB — IRON AND TIBC: Iron: <10 ug/dL — ABNORMAL LOW (ref 42–135)

## 2012-03-25 LAB — CBC
HCT: 27.6 % — ABNORMAL LOW (ref 36.0–46.0)
MCV: 64 fL — ABNORMAL LOW (ref 78.0–100.0)
Platelets: 216 10*3/uL (ref 150–400)
RBC: 4.31 MIL/uL (ref 3.87–5.11)
WBC: 2.7 10*3/uL — ABNORMAL LOW (ref 4.0–10.5)

## 2012-03-25 LAB — FOLATE: Folate: 13.4 ng/mL

## 2012-03-25 LAB — FERRITIN: Ferritin: 94 ng/mL (ref 10–291)

## 2012-03-25 MED ORDER — ONDANSETRON HCL 4 MG PO TABS
4.0000 mg | ORAL_TABLET | Freq: Four times a day (QID) | ORAL | Status: DC | PRN
  Administered 2012-03-25: 4 mg via ORAL
  Filled 2012-03-25: qty 1

## 2012-03-25 MED ORDER — HYDROCODONE-ACETAMINOPHEN 5-325 MG PO TABS
1.0000 | ORAL_TABLET | ORAL | Status: DC | PRN
  Administered 2012-03-25 – 2012-03-27 (×7): 2 via ORAL
  Filled 2012-03-25: qty 1
  Filled 2012-03-25 (×3): qty 2
  Filled 2012-03-25: qty 1
  Filled 2012-03-25 (×3): qty 2

## 2012-03-25 MED ORDER — BENZONATATE 100 MG PO CAPS
100.0000 mg | ORAL_CAPSULE | Freq: Three times a day (TID) | ORAL | Status: DC | PRN
  Administered 2012-03-25 – 2012-03-26 (×4): 100 mg via ORAL
  Filled 2012-03-25 (×6): qty 1

## 2012-03-25 MED ORDER — IOHEXOL 300 MG/ML  SOLN
80.0000 mL | Freq: Once | INTRAMUSCULAR | Status: AC | PRN
  Administered 2012-03-25: 80 mL via INTRAVENOUS

## 2012-03-25 MED ORDER — SODIUM CHLORIDE 0.9 % IJ SOLN: 3.0000 mL | INTRAMUSCULAR | Status: DC | PRN

## 2012-03-25 MED ORDER — BENZONATATE 100 MG PO CAPS: 100.0000 mg | ORAL_CAPSULE | Freq: Three times a day (TID) | ORAL | Status: DC | PRN

## 2012-03-25 MED ORDER — INFLUENZA VIRUS VACC SPLIT PF IM SUSP
0.5000 mL | INTRAMUSCULAR | Status: AC
  Filled 2012-03-25: qty 0.5

## 2012-03-25 MED ORDER — LEVOFLOXACIN 750 MG PO TABS: 750.0000 mg | ORAL_TABLET | Freq: Every day | ORAL | Status: DC

## 2012-03-25 MED ORDER — ACETAMINOPHEN 325 MG PO TABS: 650.0000 mg | ORAL_TABLET | Freq: Once | ORAL | Status: DC

## 2012-03-25 MED ORDER — IPRATROPIUM BROMIDE 0.02 % IN SOLN
0.5000 mg | RESPIRATORY_TRACT | Status: DC | PRN
  Administered 2012-03-26 – 2012-03-27 (×3): 0.5 mg via RESPIRATORY_TRACT
  Filled 2012-03-25 (×4): qty 2.5

## 2012-03-25 MED ORDER — PANTOPRAZOLE SODIUM 40 MG PO TBEC
40.0000 mg | DELAYED_RELEASE_TABLET | Freq: Every day | ORAL | Status: DC
  Administered 2012-03-26 – 2012-03-27 (×2): 40 mg via ORAL
  Filled 2012-03-25 (×2): qty 1

## 2012-03-25 MED ORDER — ACETAMINOPHEN 650 MG RE SUPP: 650.0000 mg | Freq: Four times a day (QID) | RECTAL | Status: DC | PRN

## 2012-03-25 MED ORDER — FERROUS SULFATE 325 (65 FE) MG PO TABS: 325.0000 mg | ORAL_TABLET | Freq: Two times a day (BID) | ORAL | Status: DC

## 2012-03-25 MED ORDER — ACETAMINOPHEN 325 MG PO TABS
650.0000 mg | ORAL_TABLET | Freq: Four times a day (QID) | ORAL | Status: DC | PRN
  Administered 2012-03-25 – 2012-03-27 (×5): 650 mg via ORAL
  Filled 2012-03-25 (×5): qty 2

## 2012-03-25 MED ORDER — ALBUTEROL SULFATE (5 MG/ML) 0.5% IN NEBU
2.5000 mg | INHALATION_SOLUTION | RESPIRATORY_TRACT | Status: DC | PRN
  Administered 2012-03-26 – 2012-03-27 (×3): 2.5 mg via RESPIRATORY_TRACT
  Filled 2012-03-25 (×4): qty 0.5

## 2012-03-25 MED ORDER — ONDANSETRON HCL 4 MG/2ML IJ SOLN: 4.0000 mg | Freq: Four times a day (QID) | INTRAMUSCULAR | Status: DC | PRN

## 2012-03-25 MED ORDER — SENNOSIDES-DOCUSATE SODIUM 8.6-50 MG PO TABS: 1.0000 | ORAL_TABLET | Freq: Every evening | ORAL | Status: DC | PRN

## 2012-03-25 MED ORDER — ALUM & MAG HYDROXIDE-SIMETH 200-200-20 MG/5ML PO SUSP
30.0000 mL | Freq: Four times a day (QID) | ORAL | Status: DC | PRN
  Administered 2012-03-25: 30 mL via ORAL
  Filled 2012-03-25: qty 30

## 2012-03-25 MED ORDER — LEVOFLOXACIN IN D5W 750 MG/150ML IV SOLN
750.0000 mg | INTRAVENOUS | Status: DC
  Administered 2012-03-25 – 2012-03-28 (×4): 750 mg via INTRAVENOUS
  Filled 2012-03-25 (×7): qty 150

## 2012-03-25 MED ORDER — SODIUM CHLORIDE 0.9 % IJ SOLN
3.0000 mL | Freq: Two times a day (BID) | INTRAMUSCULAR | Status: DC
  Administered 2012-03-25 – 2012-03-31 (×12): 3 mL via INTRAVENOUS
  Administered 2012-04-01: 30 mL via INTRAVENOUS
  Administered 2012-04-02 – 2012-04-03 (×2): 3 mL via INTRAVENOUS

## 2012-03-25 NOTE — Progress Notes (Signed)
Patient has agreed to stay tonight to continue inpatient treatment for her PNA. DC order has been cancelled.  Peggye Pitt, MD [facility] Pager: [phone]

## 2012-03-25 NOTE — Discharge Summary (Signed)
Physician Discharge Summary  Patient ID: Megan Espinoza MRN: [id number] DOB/AGE: [birth year] 56 y.o.  Admit date: 03/24/2012 Discharge date: 03/25/2012  Primary Care Physician:  [name], MD   Discharge Diagnoses:    Active Problems:  Community acquired pneumonia  Microcytic anemia  Obesity (BMI 30.0-34.9)  Leukopenia      Medication List     As of 03/25/2012  9:16 AM    TAKE these medications         benzonatate 100 MG capsule   Commonly known as: TESSALON   Take 1 capsule (100 mg total) by mouth 3 (three) times daily as needed for cough.      ferrous sulfate 325 (65 FE) MG tablet   Take 1 tablet (325 mg total) by mouth 2 (two) times daily.      levofloxacin 750 MG tablet   Commonly known as: LEVAQUIN   Take 1 tablet (750 mg total) by mouth daily.      PRILOSEC PO   Take 1 tablet by mouth daily.           Disposition and Follow-up:  Patient is adamant about leaving today or she will sign out AMA. Will DC her on levaquin for 10 days. Will ask CM to see if she can be set up with an OP provider.  Consults:  None.   Significant Diagnostic Studies:  Dg Chest 1 View  03/24/2012  *RADIOLOGY REPORT*  Clinical Data: Chest pain and shortness of breath.  CHEST - 1 VIEW  Comparison: Chest radiograph performed earlier today at 06:41 p.m.  Findings: Basilar opacity and additional midlung opacity is seen on lateral chest radiograph, corresponding to the bilateral airspace opacities noted on the PA radiograph.  Findings are more suspicious for pneumonia, though edema could conceivably have a similar appearance.  Hemorrhage is considered less likely.  The cardiomediastinal silhouette is borderline normal in size.  No acute osseous abnormalities are seen.  IMPRESSION: Bilateral airspace opacities, suspicious for pneumonia.  Edema could conceivably have a similar appearance.   Original Report Authenticated By: Tonia Ghent, M.D.    Dg Chest 1 View  03/24/2012   *RADIOLOGY REPORT*  Clinical Data: Shortness of breath.  Dizziness.  CHEST - 1 VIEW  Comparison: None.  Findings: Bilateral heterogeneous air space disease is seen in the mid and lower lung zones, left side greater than right.  The differential diagnosis includes infection, edema, and hemorrhage.  No evidence of pleural effusion.  Heart size is normal.  Low lung volumes are noted.  IMPRESSION: Bilateral heterogeneous air space disease, left side greater than right.  Differential diagnosis includes pneumonia, edema, and hemorrhage.   Original Report Authenticated By: Myles Rosenthal, M.D.    Ct Chest W Contrast  03/25/2012  *RADIOLOGY REPORT*  Clinical Data: Cough.  Bilateral airspace disease.  CT CHEST WITH CONTRAST  Technique:  Multidetector CT imaging of the chest was performed following the standard protocol during bolus administration of intravenous contrast.  Contrast: 80mL OMNIPAQUE IOHEXOL 300 MG/ML  SOLN  Comparison: None.  Findings: Bilateral patchy airspace disease with air bronchograms is seen involving all lobes of both lungs.  This may be due to infectious or inflammatory process, hemorrhage, or less likely pulmonary edema.  No evidence of pleural or pericardial effusion.  A small to moderate hiatal hernia is noted.  No evidence of central endobronchial obstruction.  Mild mediastinal and bilateral hilar lymphadenopathy is seen.  No evidence of axillary lymphadenopathy or chest wall mass.  IMPRESSION:  1.  Patchy diffuse bilateral pulmonary air space disease with air bronchograms.  This is suspicious for an infectious or inflammatory process, hemorrhage, or less likely edema. Consider bronchoscopy for further evaluation. 2.  Mild mediastinal and bilateral hilar lymphadenopathy. 3.  No evidence of pleural effusion. 4.  Small to moderate hiatal hernia.   Original Report Authenticated By: Myles Rosenthal, M.D.     Brief H and P: For complete details please refer to admission H and P, but in brief patient is a  56 year old female who states she's been feeling ill since Saturday. She states she's had a nonproductive cough. No hemoptysis. Her cough has been severe enough there is no causing her chest and back to her hurt.she denies any shortness of breath or wheezing. She states she's had a temperature of 102 Max, she's had mild chills but no nausea or vomiting. She denies feeling weak, she denies any myalgia. She denies any blood in stool or urine. She denies any lower extremity edema or any weight gain or loss. She denies any chest pain. She states she does have history of anemia but that was on during pregnancy, she's never had a blood transfusion. Patient is postmenopausal for 6-7 years, she's never had a colonoscopy. No history of blood in her stool, she states her stool is brown. We were asked to admit her for further evaluation and management.     Hospital Course:  Active Problems:  Community acquired pneumonia  Microcytic anemia  Obesity (BMI 30.0-34.9)  Leukopenia   CAP -She has been afebrile since admission. -Significant bilateral airspace disease on both CXR and CT. -She is ADAMANT that we discharge her or she will leave AMA. -Given her significant disease I have elected to DC her so at least she can receive an antibiotic prescription. -Will check her oxygen requirements on RA as she is only 92% on 2 L. She is agreeable to short-term home oxygen if we think it is required. -Will ask CM to set her up, if possible, with an OP provider that she should see in about 2 weeks.  Microcytic Anemia -I suspect she has chronic Fe deficiency anemia. -Do not know if she has chronic blood loss. -Will be discharged on iron supplementation. -Have advised her that she should have a colonoscopy in the near future.  Leukopenia -Etiology unclear. -Could be related to her acute infectious process (poor prognostic factor).  I have a suspicion that she will do poorly at home and will likely require re  hospitalization for her PNA.  Time spent on Discharge: Greater than 30 minutes.  SignedChaya Jan [facility] Pager: [phone] 03/25/2012, 9:16 AM

## 2012-03-25 NOTE — Progress Notes (Signed)
Recheck of resting room air SPO2 ranged 86-91%.  Will inform MD and CCM.

## 2012-03-25 NOTE — Progress Notes (Signed)
Pt with temp of 100.9.  MD permission given to admin 650mg  of tylenol at this time although only 3 hours since last dose.  Discussed return of fever and continued need for oxygen supplementation and Pt agrees to stay another night.  MD informed of decision and DC home order discontinued.  Will con't plan of care.

## 2012-03-25 NOTE — Progress Notes (Signed)
RA sat 87% initially but came up to 94% after some coaching to breath deeper.  WIll con't plan of care

## 2012-03-25 NOTE — Progress Notes (Signed)
Pt temperature 100.3 prior to blood transfusion, MD on call notified, gave order to give tylenol 650 mg and to monitor temp, and to go ahead with blood transfusion. Will continue to monitor.

## 2012-03-26 LAB — BLOOD GAS, ARTERIAL
Bicarbonate: 28 mEq/L — ABNORMAL HIGH (ref 20.0–24.0)
O2 Saturation: 94.3 %
pO2, Arterial: 67.5 mmHg — ABNORMAL LOW (ref 80.0–100.0)

## 2012-03-26 LAB — TYPE AND SCREEN
ABO/RH(D): O POS
Antibody Screen: NEGATIVE

## 2012-03-26 LAB — URINE CULTURE: Colony Count: >=100000

## 2012-03-26 MED ORDER — DIPHENHYDRAMINE HCL 25 MG PO CAPS
25.0000 mg | ORAL_CAPSULE | Freq: Three times a day (TID) | ORAL | Status: DC | PRN
  Administered 2012-03-26 – 2012-03-27 (×3): 25 mg via ORAL
  Filled 2012-03-26 (×4): qty 1

## 2012-03-26 MED ORDER — FERROUS SULFATE 325 (65 FE) MG PO TABS
325.0000 mg | ORAL_TABLET | Freq: Two times a day (BID) | ORAL | Status: DC
  Administered 2012-03-26 – 2012-03-28 (×3): 325 mg via ORAL
  Filled 2012-03-26 (×6): qty 1

## 2012-03-26 NOTE — Progress Notes (Signed)
Nurse reassess patient's temp after tylenol administration and observed a temp of 99.1. Patient articulated that she could tell a difference. Harmon Pier

## 2012-03-26 NOTE — Progress Notes (Signed)
Nurse upon checking patient's vitals @ 0400 observed a temp of 102.3 and O2 sats of 77% on 4L. Nurse gave 2 tylenol to control temp. Patient also asked for pain meds. Will give 2 vicodin in 30 - 40 minutes. Harmon Pier

## 2012-03-26 NOTE — Progress Notes (Signed)
[facility]             Progress Note   Subjective: Febrile. Non-productive cough. Decided to stay in the hospital yesterday.  Objective: Vital signs in last 24 hours: Temp:  [99.1 F (37.3 C)-102.3 F (39.1 C)] 99.1 F (37.3 C) (12/07 0545) Pulse Rate:  [106-111] 111  (12/07 0356) Resp:  [20] 20  (12/07 0356) BP: (133-149)/(63-73) 149/73 mmHg (12/07 0356) SpO2:  [88 %-94 %] 91 % (12/07 0502) Weight change:  Last BM Date: 03/25/12  Intake/Output from previous day: 12/06 0701 - 12/07 0700 In: 783 [P.O.:780; I.V.:3] Out: 254 [Urine:254]     Physical Exam: General: Alert, awake, oriented x3. HEENT: No bruits, no goiter. Heart: Regular rate and rhythm, without murmurs, rubs, gallops. Lungs: Bilateral ronchi. Abdomen: Soft, nontender, nondistended, positive bowel sounds. Extremities: No clubbing cyanosis or edema with positive pedal pulses. Neuro: Grossly intact, nonfocal.    Lab Results: Basic Metabolic Panel:  Basename 03/25/12 0615 03/24/12 2021  NA 142 139  K 3.6 3.1*  CL 104 101  CO2 26 27  GLUCOSE 109* 115*  BUN 15 14  CREATININE 0.76 1.06  CALCIUM 8.4 8.3*  MG -- --  PHOS -- --   Liver Function Tests:  [facility] 03/25/12 0615  AST 63*  ALT 25  ALKPHOS 103  BILITOT 0.3  PROT 6.9  ALBUMIN 2.6*    Basename 03/24/12 2021  LIPASE 48  AMYLASE --   CBC:  Basename 03/25/12 0615 03/24/12 2021  WBC 2.7* 2.8*  NEUTROABS -- 2.1  HGB 8.2* 7.7*  HCT 27.6* 25.3*  MCV 64.0* 62.6*  PLT 216 220   Cardiac Enzymes:  Basename 03/24/12 2021  CKTOTAL --  CKMB --  CKMBINDEX --  TROPONINI <0.30   BNP:  Basename 03/24/12 2322  PROBNP 155.5*   Anemia Panel:  Basename 03/24/12 2323  VITAMINB12 1880*  FOLATE 13.4  FERRITIN 94  TIBC Not calculated due to Iron <10.  IRON <10*  RETICCTPCT 0.3*    Basename 03/24/12 2049  COLORURINE AMBER*  LABSPEC 1.034*  PHURINE 5.5  GLUCOSEU NEGATIVE  HGBUR NEGATIVE  BILIRUBINUR SMALL*   KETONESUR 15*  PROTEINUR 100*  UROBILINOGEN 2.0*  NITRITE NEGATIVE  LEUKOCYTESUR SMALL*    Recent Results (from the past 240 hour(s))  URINE CULTURE     Status: Normal (Preliminary result)   Collection Time   03/24/12  8:49 PM      Component Value Range Status Comment   Specimen Description URINE, CLEAN CATCH   Final    Special Requests NONE CX ADDED AT 2124 ON 161096   Final    Culture  Setup Time 03/24/2012 22:54   Final    Colony Count PENDING   Incomplete    Culture Culture reincubated for better growth   Final    Report Status PENDING   Incomplete     Studies/Results: Dg Chest 1 View  03/24/2012  *RADIOLOGY REPORT*  Clinical Data: Chest pain and shortness of breath.  CHEST - 1 VIEW  Comparison: Chest radiograph performed earlier today at 06:41 p.m.  Findings: Basilar opacity and additional midlung opacity is seen on lateral chest radiograph, corresponding to the bilateral airspace opacities noted on the PA radiograph.  Findings are more suspicious for pneumonia, though edema could conceivably have a similar appearance.  Hemorrhage is considered less likely.  The cardiomediastinal silhouette is borderline normal in size.  No acute osseous abnormalities are seen.  IMPRESSION: Bilateral airspace opacities, suspicious for pneumonia.  Edema  could conceivably have a similar appearance.   Original Report Authenticated By: Tonia Ghent, M.D.    Dg Chest 1 View  03/24/2012  *RADIOLOGY REPORT*  Clinical Data: Shortness of breath.  Dizziness.  CHEST - 1 VIEW  Comparison: None.  Findings: Bilateral heterogeneous air space disease is seen in the mid and lower lung zones, left side greater than right.  The differential diagnosis includes infection, edema, and hemorrhage.  No evidence of pleural effusion.  Heart size is normal.  Low lung volumes are noted.  IMPRESSION: Bilateral heterogeneous air space disease, left side greater than right.  Differential diagnosis includes pneumonia, edema, and  hemorrhage.   Original Report Authenticated By: Myles Rosenthal, M.D.    Ct Chest W Contrast  03/25/2012  *RADIOLOGY REPORT*  Clinical Data: Cough.  Bilateral airspace disease.  CT CHEST WITH CONTRAST  Technique:  Multidetector CT imaging of the chest was performed following the standard protocol during bolus administration of intravenous contrast.  Contrast: 80mL OMNIPAQUE IOHEXOL 300 MG/ML  SOLN  Comparison: None.  Findings: Bilateral patchy airspace disease with air bronchograms is seen involving all lobes of both lungs.  This may be due to infectious or inflammatory process, hemorrhage, or less likely pulmonary edema.  No evidence of pleural or pericardial effusion.  A small to moderate hiatal hernia is noted.  No evidence of central endobronchial obstruction.  Mild mediastinal and bilateral hilar lymphadenopathy is seen.  No evidence of axillary lymphadenopathy or chest wall mass.  IMPRESSION:  1.  Patchy diffuse bilateral pulmonary air space disease with air bronchograms.  This is suspicious for an infectious or inflammatory process, hemorrhage, or less likely edema. Consider bronchoscopy for further evaluation. 2.  Mild mediastinal and bilateral hilar lymphadenopathy. 3.  No evidence of pleural effusion. 4.  Small to moderate hiatal hernia.   Original Report Authenticated By: Myles Rosenthal, M.D.     Medications: Scheduled Meds:   . acetaminophen  650 mg Oral Once  . influenza  inactive virus vaccine  0.5 mL Intramuscular Tomorrow-1000  . levofloxacin (LEVAQUIN) IV  750 mg Intravenous Q24H  . pantoprazole  40 mg Oral Daily  . sodium chloride  3 mL Intravenous Q12H   Continuous Infusions:  PRN Meds:.acetaminophen, acetaminophen, albuterol, alum & mag hydroxide-simeth, benzonatate, HYDROcodone-acetaminophen, ipratropium, ondansetron (ZOFRAN) IV, ondansetron, senna-docusate, sodium chloride  Assessment/Plan:  Active Problems:  Community acquired pneumonia  Microcytic anemia  Obesity (BMI  30.0-34.9)  Leukopenia   CAP -Continue Levaquin. -She is still febrile with a T max of 102.3 overnight. -Still has significant oxygen requirements. -Titrate down oxygen as tolerated.  Microcytic Anemia -Start ferrous sulfate. -Will need OP colonoscopy at some point in the near future.  Leukopenia -Probably related to her PNA and is a poor prognostic factor. -Will continue to monitor.   Time spent coordinating care: 35 minutes   LOS: 2 days   [facility] Pager: [phone] 03/26/2012, 11:14 AM

## 2012-03-26 NOTE — Progress Notes (Signed)
Pt sats decreased in the 60's, increased o2 to 5lnc, sats increased to 78%. Pt denies sob and pain. RT to give pt breathing treatment. Rapid Response called, pt placed on 50% nonrebreather mask. o2 sats increase to 92% after breathing treatment and nonrebreather applied. Pt to be transferred to step down unit.

## 2012-03-26 NOTE — Progress Notes (Signed)
Subjective: Interval History: oxygen saturation in 60's, oxygen increased to [facility] with improvement to 78%, placed on NRB with gradual improvement 92-96%. Patient denies distress, pain, dyspnea.  Objective: Vital signs in last 24 hours: Temp:  [97.9 F (36.6 C)-102.3 F (39.1 C)] 98.6 F (37 C) (12/07 2013) Pulse Rate:  [80-111] 93  (12/07 2013) Resp:  [16-20] 18  (12/07 2013) BP: (140-150)/(73-81) 140/80 mmHg (12/07 2013) SpO2:  [63 %-98 %] 95 % (12/07 2101) FiO2 (%):  [50 %-100 %] 100 % (12/07 2101)  Intake/Output from previous day: 12/06 0701 - 12/07 0700 In: 1023 [P.O.:1020; I.V.:3] Out: 254 [Urine:254] Intake/Output this shift:    BP 140/80  Pulse 93  Temp 98.6 F (37 C) (Oral)  Resp 18  Ht 5\' 5"  (1.651 m)  Wt 125.5 kg (276 lb 10.8 oz)  BMI 46.04 kg/m2  SpO2 95% General appearance: alert, cooperative and no distress Lungs: scattered rales bilaterally Heart: regular rate and rhythm  Results for orders placed during the hospital encounter of 03/24/12 (from the past 24 hour(s))  BLOOD GAS, ARTERIAL     Status: Abnormal   Collection Time   03/26/12  8:46 PM      Component Value Range   FIO2 100.00     Delivery systems NON-REBREATHER OXYGEN MASK     pH, Arterial 7.380  7.350 - 7.450   pCO2 arterial 48.5 (*) 35.0 - 45.0 mmHg   pO2, Arterial 67.5 (*) 80.0 - 100.0 mmHg   Bicarbonate 28.0 (*) 20.0 - 24.0 mEq/L   TCO2 29.5  0 - 100 mmol/L   Acid-Base Excess 3.3 (*) 0.0 - 2.0 mmol/L   O2 Saturation 94.3     Patient temperature 98.6     Collection site LEFT RADIAL     Drawn by [id number]     Sample type ARTERIAL DRAW     Allens test (pass/fail) PASS  PASS    Studies/Results: Dg Chest 1 View  03/24/2012  *RADIOLOGY REPORT*  Clinical Data: Chest pain and shortness of breath.  CHEST - 1 VIEW  Comparison: Chest radiograph performed earlier today at 06:41 p.m.  Findings: Basilar opacity and additional midlung opacity is seen on lateral chest radiograph, corresponding to the  bilateral airspace opacities noted on the PA radiograph.  Findings are more suspicious for pneumonia, though edema could conceivably have a similar appearance.  Hemorrhage is considered less likely.  The cardiomediastinal silhouette is borderline normal in size.  No acute osseous abnormalities are seen.  IMPRESSION: Bilateral airspace opacities, suspicious for pneumonia.  Edema could conceivably have a similar appearance.   Original Report Authenticated By: Tonia Ghent, M.D.    Dg Chest 1 View  03/24/2012  *RADIOLOGY REPORT*  Clinical Data: Shortness of breath.  Dizziness.  CHEST - 1 VIEW  Comparison: None.  Findings: Bilateral heterogeneous air space disease is seen in the mid and lower lung zones, left side greater than right.  The differential diagnosis includes infection, edema, and hemorrhage.  No evidence of pleural effusion.  Heart size is normal.  Low lung volumes are noted.  IMPRESSION: Bilateral heterogeneous air space disease, left side greater than right.  Differential diagnosis includes pneumonia, edema, and hemorrhage.   Original Report Authenticated By: Myles Rosenthal, M.D.    Ct Chest W Contrast  03/25/2012  *RADIOLOGY REPORT*  Clinical Data: Cough.  Bilateral airspace disease.  CT CHEST WITH CONTRAST  Technique:  Multidetector CT imaging of the chest was performed following the standard protocol during bolus administration of intravenous  contrast.  Contrast: 80mL OMNIPAQUE IOHEXOL 300 MG/ML  SOLN  Comparison: None.  Findings: Bilateral patchy airspace disease with air bronchograms is seen involving all lobes of both lungs.  This may be due to infectious or inflammatory process, hemorrhage, or less likely pulmonary edema.  No evidence of pleural or pericardial effusion.  A small to moderate hiatal hernia is noted.  No evidence of central endobronchial obstruction.  Mild mediastinal and bilateral hilar lymphadenopathy is seen.  No evidence of axillary lymphadenopathy or chest wall mass.   IMPRESSION:  1.  Patchy diffuse bilateral pulmonary air space disease with air bronchograms.  This is suspicious for an infectious or inflammatory process, hemorrhage, or less likely edema. Consider bronchoscopy for further evaluation. 2.  Mild mediastinal and bilateral hilar lymphadenopathy. 3.  No evidence of pleural effusion. 4.  Small to moderate hiatal hernia.   Original Report Authenticated By: Myles Rosenthal, M.D.     Scheduled Meds:   . acetaminophen  650 mg Oral Once  . ferrous sulfate  325 mg Oral BID WC  . influenza  inactive virus vaccine  0.5 mL Intramuscular Tomorrow-1000  . levofloxacin (LEVAQUIN) IV  750 mg Intravenous Q24H  . pantoprazole  40 mg Oral Daily  . sodium chloride  3 mL Intravenous Q12H   Continuous Infusions:  PRN Meds:acetaminophen, acetaminophen, albuterol, alum & mag hydroxide-simeth, benzonatate, diphenhydrAMINE, HYDROcodone-acetaminophen, ipratropium, ondansetron (ZOFRAN) IV, ondansetron, senna-docusate, sodium chloride  Assessment/Plan: Hypoxia: ABG with P02 67.5, will transfer to stepdown, consider Bipap. Continue current antibiotic for pneumonia.   Psychosocial: patient and husband anxious about lack of improvement. Tried to re-assure that it can take time for pneumonia to improve once treatment is started.    LOS: 2 days   Philippe Gang A.

## 2012-03-26 NOTE — Progress Notes (Signed)
Nurse reassess patient. Patient was sitting on the side of the bed when nurse entered room. Patient's O2 Sats got to 90%. Megan Espinoza

## 2012-03-27 ENCOUNTER — Inpatient Hospital Stay (HOSPITAL_COMMUNITY): Payer: MEDICAID

## 2012-03-27 DIAGNOSIS — J9601 Acute respiratory failure with hypoxia: Secondary | ICD-10-CM | POA: Diagnosis present

## 2012-03-27 DIAGNOSIS — J9600 Acute respiratory failure, unspecified whether with hypoxia or hypercapnia: Secondary | ICD-10-CM

## 2012-03-27 LAB — BASIC METABOLIC PANEL
BUN: 15 mg/dL (ref 6–23)
Chloride: 104 mEq/L (ref 96–112)
GFR calc Af Amer: >90 mL/min (ref >90)
Glucose, Bld: 88 mg/dL (ref 70–99)
Potassium: 3.9 mEq/L (ref 3.5–5.1)

## 2012-03-27 LAB — CBC
HCT: 27.1 % — ABNORMAL LOW (ref 36.0–46.0)
Hemoglobin: 8.1 g/dL — ABNORMAL LOW (ref 12.0–15.0)
MCHC: 29.9 g/dL — ABNORMAL LOW (ref 30.0–36.0)

## 2012-03-27 LAB — MRSA PCR SCREENING: MRSA by PCR: NEGATIVE

## 2012-03-27 MED ORDER — VANCOMYCIN HCL 10 G IV SOLR
1250.0000 mg | Freq: Two times a day (BID) | INTRAVENOUS | Status: DC
  Administered 2012-03-27 – 2012-03-29 (×4): 1250 mg via INTRAVENOUS
  Filled 2012-03-27 (×5): qty 1250

## 2012-03-27 MED ORDER — IPRATROPIUM BROMIDE 0.02 % IN SOLN
0.5000 mg | RESPIRATORY_TRACT | Status: DC
  Administered 2012-03-28 (×3): 0.5 mg via RESPIRATORY_TRACT
  Filled 2012-03-27 (×3): qty 2.5

## 2012-03-27 MED ORDER — VANCOMYCIN HCL 10 G IV SOLR
2000.0000 mg | Freq: Once | INTRAVENOUS | Status: AC
  Administered 2012-03-27: 2000 mg via INTRAVENOUS
  Filled 2012-03-27: qty 2000

## 2012-03-27 MED ORDER — OSELTAMIVIR PHOSPHATE 75 MG PO CAPS
75.0000 mg | ORAL_CAPSULE | Freq: Two times a day (BID) | ORAL | Status: DC
  Administered 2012-03-27 – 2012-03-28 (×3): 75 mg via ORAL
  Filled 2012-03-27 (×6): qty 1

## 2012-03-27 MED ORDER — FUROSEMIDE 10 MG/ML IJ SOLN
80.0000 mg | Freq: Once | INTRAMUSCULAR | Status: AC
  Administered 2012-03-27: 80 mg via INTRAVENOUS
  Filled 2012-03-27: qty 8

## 2012-03-27 MED ORDER — DIPHENHYDRAMINE HCL 50 MG/ML IJ SOLN
INTRAMUSCULAR | Status: AC
  Administered 2012-03-27: 25 mg via INTRAVENOUS
  Filled 2012-03-27: qty 1

## 2012-03-27 MED ORDER — ALBUTEROL SULFATE (5 MG/ML) 0.5% IN NEBU
2.5000 mg | INHALATION_SOLUTION | RESPIRATORY_TRACT | Status: DC
  Administered 2012-03-28 (×3): 2.5 mg via RESPIRATORY_TRACT
  Filled 2012-03-27 (×3): qty 0.5

## 2012-03-27 MED ORDER — WHITE PETROLATUM GEL
Status: AC
  Administered 2012-03-27: 0.2
  Filled 2012-03-27: qty 5

## 2012-03-27 MED ORDER — BIOTENE DRY MOUTH MT LIQD
15.0000 mL | Freq: Two times a day (BID) | OROMUCOSAL | Status: DC
  Administered 2012-03-27: 15 mL via OROMUCOSAL

## 2012-03-27 MED ORDER — DEXTROSE 5 % IV SOLN
1.0000 g | Freq: Three times a day (TID) | INTRAVENOUS | Status: DC
  Administered 2012-03-27 – 2012-03-30 (×10): 1 g via INTRAVENOUS
  Filled 2012-03-27 (×12): qty 1

## 2012-03-27 MED ORDER — DIPHENHYDRAMINE HCL 50 MG/ML IJ SOLN
25.0000 mg | Freq: Once | INTRAMUSCULAR | Status: AC
  Administered 2012-03-27: 25 mg via INTRAVENOUS

## 2012-03-27 MED ORDER — ALPRAZOLAM 0.5 MG PO TABS
0.5000 mg | ORAL_TABLET | Freq: Once | ORAL | Status: AC
  Administered 2012-03-27: 0.5 mg via ORAL
  Filled 2012-03-27: qty 1

## 2012-03-27 NOTE — Progress Notes (Signed)
ANTIBIOTIC CONSULT NOTE - INITIAL  Pharmacy Consult:  Vancomycin / Azactam Indication:  PNA  Allergies  Allergen Reactions  . Penicillins Swelling  . Codeine Nausea And Vomiting    Patient Measurements: Height: 5\' 5"  (165.1 cm) Weight: 276 lb 10.8 oz (125.5 kg) IBW/kg (Calculated) : 57   Vital Signs: Temp: 98.9 F (37.2 C) (12/08 0400) Temp src: Oral (12/08 0400) BP: 136/55 mmHg (12/08 0600) Pulse Rate: 88  (12/08 0600) Intake/Output from previous day: 12/07 0701 - 12/08 0700 In: 780 [P.O.:480; IV Piggyback:300] Out: 203 [Urine:203]  Labs:  [facility] 03/27/12 0505 03/25/12 0615 03/24/12 2021  WBC 2.9* 2.7* 2.8*  HGB 8.1* 8.2* 7.7*  PLT 204 216 220  LABCREA -- -- --  CREATININE 0.74 0.76 1.06   Estimated Creatinine Clearance: 104.6 ml/min (by C-G formula based on Cr of 0.74). No results found for this basename: VANCOTROUGH:2,VANCOPEAK:2,VANCORANDOM:2,GENTTROUGH:2,GENTPEAK:2,GENTRANDOM:2,TOBRATROUGH:2,TOBRAPEAK:2,TOBRARND:2,AMIKACINPEAK:2,AMIKACINTROU:2,AMIKACIN:2, in the last 72 hours   Microbiology: Recent Results (from the past 720 hour(s))  URINE CULTURE     Status: Normal   Collection Time   03/24/12  8:49 PM      Component Value Range Status Comment   Specimen Description URINE, CLEAN CATCH   Final    Special Requests NONE CX ADDED AT 2124 ON 161096   Final    Culture  Setup Time 03/24/2012 22:54   Final    Colony Count >=100,000 COLONIES/ML   Final    Culture     Final    Value: DIPHTHEROIDS(CORYNEBACTERIUM SPECIES)     Note: Standardized susceptibility testing for this organism is not available.   Report Status 03/26/2012 FINAL   Final   CULTURE, BLOOD (ROUTINE X 2)     Status: Normal (Preliminary result)   Collection Time   03/24/12 10:45 PM      Component Value Range Status Comment   Specimen Description BLOOD RIGHT HAND   Final    Special Requests BOTTLES DRAWN AEROBIC AND ANAEROBIC 10CC   Final    Culture  Setup Time 03/25/2012 03:42   Final    Culture     Final    Value:        BLOOD CULTURE RECEIVED NO GROWTH TO DATE CULTURE WILL BE HELD FOR 5 DAYS BEFORE ISSUING A FINAL NEGATIVE REPORT   Report Status PENDING   Incomplete   CULTURE, BLOOD (ROUTINE X 2)     Status: Normal (Preliminary result)   Collection Time   03/24/12 10:50 PM      Component Value Range Status Comment   Specimen Description BLOOD LEFT HAND   Final    Special Requests BOTTLES DRAWN AEROBIC AND ANAEROBIC   Final    Culture  Setup Time 03/25/2012 03:42   Final    Culture     Final    Value:        BLOOD CULTURE RECEIVED NO GROWTH TO DATE CULTURE WILL BE HELD FOR 5 DAYS BEFORE ISSUING A FINAL NEGATIVE REPORT   Report Status PENDING   Incomplete   MRSA PCR SCREENING     Status: Normal   Collection Time   03/26/12 10:00 PM      Component Value Range Status Comment   MRSA by PCR NEGATIVE  NEGATIVE Final     Medical History: Past Medical History  Diagnosis Date  . Obesity (BMI 30.0-34.9)       Assessment: 29 YOF admitted with chief complain of non-productive cough and was treated with Levaquin for CAP.  Discharge canceled yesterday  because she developed a fever and was hypoxic.  Pharmacy consulted to broaden pneumonia coverage to vancomycin and aztreonam (PCN allergy).  Patient's renal function has been stable.  LVQ 12/6 >> Vanc 12/8 >> Azactam 12/8 >>   Goal of Therapy:  Vancomycin trough level 15-20 mcg/ml   Plan:  - Vanc 2000gm IV x 1, then 1250mg  IV Q12H - Azactam 1gm IV Q8H - Continue Levaquin IV Q24H - Monitor renal fxn, clinical course, vanc trough if indicated     Batsheva Stevick D. Laney Potash, PharmD, BCPS Pager:  [phone] 03/27/2012, 7:58 AM

## 2012-03-27 NOTE — Progress Notes (Signed)
Physician at patient's bedside and aware of oxygen saturation dropping into the seventies.  No new orders at this time.  Will continue to monitor and update as needed.  Val Eagle [facility] 03/27/2012

## 2012-03-27 NOTE — Progress Notes (Signed)
[facility]             Progress Note   Subjective: Had to be transferred to the unit overnight because of hypoxia. Tearful. She wants to go home to be with her son.  Objective: Vital signs in last 24 hours: Temp:  [97.9 F (36.6 C)-99 F (37.2 C)] 99 F (37.2 C) (12/08 0800) Pulse Rate:  [80-93] 87  (12/08 0900) Resp:  [15-27] 27  (12/08 0900) BP: (114-150)/(50-81) 131/73 mmHg (12/08 0800) SpO2:  [63 %-100 %] 91 % (12/08 0900) FiO2 (%):  [50 %-100 %] 100 % (12/07 2101) Weight change:  Last BM Date: 03/25/12  Intake/Output from previous day: 12/07 0701 - 12/08 0700 In: 780 [P.O.:480; IV Piggyback:300] Out: 203 [Urine:203] Total I/O In: 3 [I.V.:3] Out: -    Physical Exam: General: Alert, awake, oriented x3. HEENT: No bruits, no goiter. Heart: Regular rate and rhythm, without murmurs, rubs, gallops. Lungs: Bilateral ronchi. Abdomen: Soft, nontender, nondistended, positive bowel sounds. Extremities: No clubbing cyanosis or edema with positive pedal pulses. Neuro: Grossly intact, nonfocal.    Lab Results: Basic Metabolic Panel:  Basename 03/27/12 0505 03/25/12 0615  NA 140 142  K 3.9 3.6  CL 104 104  CO2 30 26  GLUCOSE 88 109*  BUN 15 15  CREATININE 0.74 0.76  CALCIUM 8.7 8.4  MG -- --  PHOS -- --   Liver Function Tests:  [facility] 03/25/12 0615  AST 63*  ALT 25  ALKPHOS 103  BILITOT 0.3  PROT 6.9  ALBUMIN 2.6*    Basename 03/24/12 2021  LIPASE 48  AMYLASE --   CBC:  Basename 03/27/12 0505 03/25/12 0615 03/24/12 2021  WBC 2.9* 2.7* --  NEUTROABS -- -- 2.1  HGB 8.1* 8.2* --  HCT 27.1* 27.6* --  MCV 65.5* 64.0* --  PLT 204 216 --   Cardiac Enzymes:  Basename 03/24/12 2021  CKTOTAL --  CKMB --  CKMBINDEX --  TROPONINI <0.30   BNP:  Basename 03/24/12 2322  PROBNP 155.5*   Anemia Panel:  Basename 03/24/12 2323  VITAMINB12 1880*  FOLATE 13.4  FERRITIN 94  TIBC Not calculated due to Iron <10.  IRON <10*   RETICCTPCT 0.3*    Basename 03/24/12 2049  COLORURINE AMBER*  LABSPEC 1.034*  PHURINE 5.5  GLUCOSEU NEGATIVE  HGBUR NEGATIVE  BILIRUBINUR SMALL*  KETONESUR 15*  PROTEINUR 100*  UROBILINOGEN 2.0*  NITRITE NEGATIVE  LEUKOCYTESUR SMALL*    Recent Results (from the past 240 hour(s))  URINE CULTURE     Status: Normal   Collection Time   03/24/12  8:49 PM      Component Value Range Status Comment   Specimen Description URINE, CLEAN CATCH   Final    Special Requests NONE CX ADDED AT 2124 ON [id number]   Final    Culture  Setup Time 03/24/2012 22:54   Final    Colony Count >=100,000 COLONIES/ML   Final    Culture     Final    Value: DIPHTHEROIDS(CORYNEBACTERIUM SPECIES)     Note: Standardized susceptibility testing for this organism is not available.   Report Status 03/26/2012 FINAL   Final   CULTURE, BLOOD (ROUTINE X 2)     Status: Normal (Preliminary result)   Collection Time   03/24/12 10:45 PM      Component Value Range Status Comment   Specimen Description BLOOD RIGHT HAND   Final    Special Requests BOTTLES DRAWN AEROBIC AND ANAEROBIC 10CC  Final    Culture  Setup Time 03/25/2012 03:42   Final    Culture     Final    Value:        BLOOD CULTURE RECEIVED NO GROWTH TO DATE CULTURE WILL BE HELD FOR 5 DAYS BEFORE ISSUING A FINAL NEGATIVE REPORT   Report Status PENDING   Incomplete   CULTURE, BLOOD (ROUTINE X 2)     Status: Normal (Preliminary result)   Collection Time   03/24/12 10:50 PM      Component Value Range Status Comment   Specimen Description BLOOD LEFT HAND   Final    Special Requests BOTTLES DRAWN AEROBIC AND ANAEROBIC   Final    Culture  Setup Time 03/25/2012 03:42   Final    Culture     Final    Value:        BLOOD CULTURE RECEIVED NO GROWTH TO DATE CULTURE WILL BE HELD FOR 5 DAYS BEFORE ISSUING A FINAL NEGATIVE REPORT   Report Status PENDING   Incomplete   MRSA PCR SCREENING     Status: Normal   Collection Time   03/26/12 10:00 PM      Component Value  Range Status Comment   MRSA by PCR NEGATIVE  NEGATIVE Final     Studies/Results: No results found.  Medications: Scheduled Meds:    . acetaminophen  650 mg Oral Once  . aztreonam  1 g Intravenous Q8H  . [COMPLETED] diphenhydrAMINE  25 mg Intravenous Once  . ferrous sulfate  325 mg Oral BID WC  . influenza  inactive virus vaccine  0.5 mL Intramuscular Tomorrow-1000  . levofloxacin (LEVAQUIN) IV  750 mg Intravenous Q24H  . pantoprazole  40 mg Oral Daily  . sodium chloride  3 mL Intravenous Q12H  . vancomycin  1,250 mg Intravenous Q12H  . vancomycin  2,000 mg Intravenous Once  . [COMPLETED] white petrolatum       Continuous Infusions:  PRN Meds:.acetaminophen, acetaminophen, albuterol, alum & mag hydroxide-simeth, benzonatate, diphenhydrAMINE, HYDROcodone-acetaminophen, ipratropium, ondansetron (ZOFRAN) IV, ondansetron, senna-docusate, sodium chloride  Assessment/Plan:  Principal Problem:  *Acute respiratory failure with hypoxia Active Problems:  Community acquired pneumonia  Microcytic anemia  Obesity (BMI 30.0-34.9)  Leukopenia   Acute Hypoxemic Respiratory Failure -2/2 CAP. -Continue Levaquin for atypical coverage. -Have added vanc/aztreonam. -Will recheck CXR. -Make sure sputum cx done. -She seems to be defervesing. -Still has significant oxygen requirements. -Titrate down oxygen as tolerated. -Have asked CCM to see.  Microcytic Anemia -Start ferrous sulfate. -Will need OP colonoscopy at some point in the near future.  Leukopenia -Probably related to her PNA and is a poor prognostic factor. -Will continue to monitor.   Time spent coordinating care: 35 minutes   LOS: 3 days   HERNANDEZ ACOSTA,ESTELA [facility] Pager: [phone] 03/27/2012, 9:18 AM

## 2012-03-27 NOTE — Progress Notes (Signed)
Patient ID: Megan Espinoza, female   DOB: 1955-06-11, 56 y.o.   MRN: [id number]  Scheduled nebs ordered.

## 2012-03-27 NOTE — ED Provider Notes (Signed)
Medical screening examination/treatment/procedure(s) were performed by non-physician practitioner and as supervising physician I was immediately available for consultation/collaboration.   Benny Lennert, MD 03/27/12 [phone]

## 2012-03-27 NOTE — Consult Note (Signed)
PULMONARY  / CRITICAL CARE MEDICINE  Name: Megan Espinoza MRN: [id number] DOB: 11/23/1955    LOS: 3  REFERRING PROVIDER:  Dr Ardyth Harps  CHIEF COMPLAINT:  Dyspnea, cough  BRIEF PATIENT DESCRIPTION:   LINES / TUBES:   CULTURES: Blood 12/5 >> Urine 12/5 >> diptheroids MRSA screen 12/5 >> negative  ANTIBIOTICS: Levaquin 12/5 >>  vanco 12/5 >> Aztreonam 12/5 >>   SIGNIFICANT EVENTS:  Moved to SDU/ICU for hypoxemia  LEVEL OF CARE:  Stepdown PRIMARY SERVICE:  [facility] CONSULTANTS:  PCCM CODE STATUS:  Full DIET:  Regular DVT Px:   GI Px:  Po diet  HISTORY OF PRESENT ILLNESS:  56 yo woman, never smoker, hx of obesity. Was well until 11/30 when she began to have dry cough, fever and chills. Developed some weakness. No myalgias, GI sx. No CP or hemoptysis. She was evaluated 12/5, labs revealed an anemia and leukopenia (normal plt). CXR showed bilateral scattered alveolar infiltrates. CT chest on 12/6 confirmed patchy infiltrates with associated air bronchograms. Started on levaquin 12/5. Anemia panel consistent w Fe deficiency, low retics. Sputum cx has not been collected (hasn't produced), Flu A&B were not checked. Remains febrile 12/7. Overnight was moved to ICU status due to progressive hypoxemia. Currently on 1.00 mask with desat with speaking, any activity. She is 2.7 L positive since 12/5. No hx auto-immune dz.   PAST MEDICAL HISTORY :  Past Medical History  Diagnosis Date  . Obesity (BMI 30.0-34.9)    Past Surgical History  Procedure Date  . Cholecystectomy   . Hernia repair    Prior to Admission medications   Medication Sig Start Date End Date Taking? Authorizing Provider  Omeprazole (PRILOSEC PO) Take 1 tablet by mouth daily.    Yes Historical Provider, MD  benzonatate (TESSALON) 100 MG capsule Take 1 capsule (100 mg total) by mouth 3 (three) times daily as needed for cough. 03/25/12   Henderson Cloud, MD  ferrous sulfate 325 (65 FE) MG tablet Take 1  tablet (325 mg total) by mouth 2 (two) times daily. 03/25/12   Henderson Cloud, MD  levofloxacin (LEVAQUIN) 750 MG tablet Take 1 tablet (750 mg total) by mouth daily. 03/25/12   Henderson Cloud, MD   Allergies  Allergen Reactions  . Penicillins Swelling  . Codeine Nausea And Vomiting    FAMILY HISTORY:  Family History  Problem Relation Age of Onset  . Hypertension     SOCIAL HISTORY:  reports that she has never smoked. She does not have any smokeless tobacco history on file. She reports that she drinks alcohol. She reports that she does not use illicit drugs. No exposure to mold, birds  REVIEW OF SYSTEMS:  As per HPI  INTERVAL HISTORY:   VITAL SIGNS: Temp:  [97.9 F (36.6 C)-99 F (37.2 C)] 99 F (37.2 C) (12/08 0800) Pulse Rate:  [80-93] 84  (12/08 1000) Resp:  [15-27] 22  (12/08 1000) BP: (111-150)/(50-81) 111/78 mmHg (12/08 1000) SpO2:  [63 %-100 %] 97 % (12/08 1000) FiO2 (%):  [50 %-100 %] 100 % (12/07 2101)  Intake/Output Summary (Last 24 hours) at 03/27/12 1139 Last data filed at 03/27/12 6578  Gross per 24 hour  Intake   1403 ml  Output    203 ml  Net   1200 ml    PHYSICAL EXAMINATION: General:  Obese woman, awake, slightly anxious Neuro:  Awake, alert, oriented, non-focal HEENT:  OP clear,  Neck:  No LAD, no stridor  Cardiovascular:  Regular, very distant,  Lungs:  Bilateral basilar insp crackles, no wheezes Abdomen:  Obese, non-tender Musculoskeletal:  No significant edema Skin:  No rashes   Lab 03/27/12 0505 03/25/12 0615 03/24/12 2021  NA 140 142 139  K 3.9 3.6 3.1*  CL 104 104 101  CO2 30 26 27   BUN 15 15 14   CREATININE 0.74 0.76 1.06  GLUCOSE 88 109* 115*    Lab 03/27/12 0505 03/25/12 0615 03/24/12 2021  HGB 8.1* 8.2* 7.7*  HCT 27.1* 27.6* 25.3*  WBC 2.9* 2.7* 2.8*  PLT 204 216 220   No results found.  ASSESSMENT / PLAN:  Hypoxemia respiratory failure due to acute febrile illness with scattered bilateral  infiltrates. Appearance on chest CT most consistent with a pneumonia or pneumonitis. Non-productive cough and leukopenia suggest a viral process possibly influenza (with continued fever). She has failed to respond to levofloxacin. Progressive hypoxemia may relate to positive fluid balance of 2.7 L since admission, obesity with hypoventilation due to bedrest, or evolution of infiltrates. Consider an acute pulmonary inflammatory process such as COP, hypersensitivity pneumonitis, although appearance on CT scan is atypical. Less likely but possible explanations include cardiogenic pulmonary edema or an evolving acute lung injury.   Recommendations:  - Agree with step down status. She may require noninvasive positive pressure ventilation depending on her degree of hypoxemia and respiratory fatigue - agree with expansion of antibiotics, although suspect that this is a viral process - probably outside the window for utility of empiric Tamiflu - Repeat chest x-ray now and consider repeat CT scan to assess change in her pulmonary infiltrates - Would favor empiric diuresis - BiPAP when necessary for increased work of breathing or hypoxemia - depending on her clinical course will consider bronchoscopy for cultures and possible transbronchial biopsy - will check auto-immune panel >> ANA, RF, a-scl70, a-DNA Ab - almost certainly will need polysomnogram as an outpatient  Levy Pupa, MD, PhD 03/27/2012, 12:41 PM [facility] [phone] or if no answer [phone]

## 2012-03-28 ENCOUNTER — Inpatient Hospital Stay (HOSPITAL_COMMUNITY): Payer: MEDICAID

## 2012-03-28 DIAGNOSIS — E669 Obesity, unspecified: Secondary | ICD-10-CM

## 2012-03-28 DIAGNOSIS — J80 Acute respiratory distress syndrome: Secondary | ICD-10-CM | POA: Diagnosis present

## 2012-03-28 DIAGNOSIS — J9589 Other postprocedural complications and disorders of respiratory system, not elsewhere classified: Secondary | ICD-10-CM

## 2012-03-28 DIAGNOSIS — T380X5A Adverse effect of glucocorticoids and synthetic analogues, initial encounter: Secondary | ICD-10-CM | POA: Diagnosis not present

## 2012-03-28 DIAGNOSIS — R7309 Other abnormal glucose: Secondary | ICD-10-CM

## 2012-03-28 DIAGNOSIS — R739 Hyperglycemia, unspecified: Secondary | ICD-10-CM | POA: Diagnosis not present

## 2012-03-28 HISTORY — DX: Acute respiratory distress syndrome: J80

## 2012-03-28 LAB — LEGIONELLA ANTIGEN, URINE: Legionella Antigen, Urine: NEGATIVE

## 2012-03-28 LAB — PROCALCITONIN: Procalcitonin: 0.17 ng/mL

## 2012-03-28 LAB — ANTI-SCLERODERMA ANTIBODY: Scleroderma (Scl-70) (ENA) Antibody, IgG: <1 AU/mL (ref <30)

## 2012-03-28 LAB — PNEUMOCYSTIS JIROVECI SMEAR BY DFA: Pneumocystis jiroveci Ag: NEGATIVE

## 2012-03-28 LAB — BLOOD GAS, ARTERIAL
Acid-Base Excess: 4.5 mmol/L — ABNORMAL HIGH (ref 0.0–2.0)
Acid-Base Excess: 5.3 mmol/L — ABNORMAL HIGH (ref 0.0–2.0)
Bicarbonate: 30.9 mEq/L — ABNORMAL HIGH (ref 20.0–24.0)
Drawn by: 31297
Drawn by: 31101
FIO2: 1 %
FIO2: 0.7 %
MECHVT: 340 mL
O2 Saturation: 97.5 %
PEEP: 15 cmH2O
RATE: 33 resp/min
RATE: 35 resp/min
pCO2 arterial: 78.6 mmHg (ref 35.0–45.0)
pCO2 arterial: 60.5 mmHg (ref 35.0–45.0)
pO2, Arterial: 126 mmHg — ABNORMAL HIGH (ref 80.0–100.0)
pO2, Arterial: 86 mmHg (ref 80.0–100.0)

## 2012-03-28 LAB — GLUCOSE, CAPILLARY
Glucose-Capillary: 162 mg/dL — ABNORMAL HIGH (ref 70–99)
Glucose-Capillary: 222 mg/dL — ABNORMAL HIGH (ref 70–99)

## 2012-03-28 LAB — CBC
MCV: 65 fL — ABNORMAL LOW (ref 78.0–100.0)
Platelets: 206 10*3/uL (ref 150–400)
RDW: 21.9 % — ABNORMAL HIGH (ref 11.5–15.5)
WBC: 4.7 10*3/uL (ref 4.0–10.5)

## 2012-03-28 LAB — STREP PNEUMONIAE URINARY ANTIGEN: Strep Pneumo Urinary Antigen: NEGATIVE

## 2012-03-28 LAB — INFLUENZA PANEL BY PCR (TYPE A & B): Influenza B By PCR: NEGATIVE

## 2012-03-28 LAB — RHEUMATOID FACTOR: Rhuematoid fact SerPl-aCnc: <10 IU/mL (ref <=14)

## 2012-03-28 LAB — ANTI-DNA ANTIBODY, DOUBLE-STRANDED: ds DNA Ab: 5 IU/mL (ref <30)

## 2012-03-28 MED ORDER — HYDRALAZINE HCL 20 MG/ML IJ SOLN
INTRAMUSCULAR | Status: AC
  Filled 2012-03-28: qty 1

## 2012-03-28 MED ORDER — HYDRALAZINE HCL 20 MG/ML IJ SOLN
10.0000 mg | INTRAMUSCULAR | Status: DC | PRN
  Administered 2012-03-28: 20 mg via INTRAVENOUS

## 2012-03-28 MED ORDER — CHLORHEXIDINE GLUCONATE 0.12 % MT SOLN: 15.0000 mL | Freq: Two times a day (BID) | OROMUCOSAL | Status: DC

## 2012-03-28 MED ORDER — METOPROLOL TARTRATE 1 MG/ML IV SOLN
INTRAVENOUS | Status: AC
  Administered 2012-03-28: 5 mg
  Filled 2012-03-28: qty 5

## 2012-03-28 MED ORDER — CISATRACURIUM BOLUS VIA INFUSION
5.0000 mg | Freq: Once | INTRAVENOUS | Status: AC
  Administered 2012-03-28: 5 mg via INTRAVENOUS
  Filled 2012-03-28: qty 5

## 2012-03-28 MED ORDER — ALPRAZOLAM 0.5 MG PO TABS
0.5000 mg | ORAL_TABLET | Freq: Once | ORAL | Status: DC
Start: 2012-03-28 — End: 2012-03-28

## 2012-03-28 MED ORDER — KCL IN DEXTROSE-NACL 20-5-0.45 MEQ/L-%-% IV SOLN
INTRAVENOUS | Status: DC
  Administered 2012-03-28: 50 mL/h via INTRAVENOUS
  Filled 2012-03-28: qty 1000

## 2012-03-28 MED ORDER — ETOMIDATE 2 MG/ML IV SOLN: 30.0000 mg | Freq: Once | INTRAVENOUS | Status: DC

## 2012-03-28 MED ORDER — SODIUM CHLORIDE 0.9 % IV BOLUS (SEPSIS)
1000.0000 mL | Freq: Once | INTRAVENOUS | Status: AC
  Administered 2012-03-29: 1000 mL via INTRAVENOUS

## 2012-03-28 MED ORDER — MIDAZOLAM BOLUS VIA INFUSION
1.0000 mg | INTRAVENOUS | Status: DC | PRN
  Administered 2012-04-01: 1 mg via INTRAVENOUS
  Administered 2012-04-01: 2 mg via INTRAVENOUS
  Administered 2012-04-01: 1 mg via INTRAVENOUS
  Filled 2012-03-28: qty 2

## 2012-03-28 MED ORDER — IPRATROPIUM BROMIDE 0.02 % IN SOLN
0.5000 mg | Freq: Four times a day (QID) | RESPIRATORY_TRACT | Status: DC
  Administered 2012-03-28 – 2012-04-02 (×20): 0.5 mg via RESPIRATORY_TRACT
  Filled 2012-03-28 (×20): qty 2.5

## 2012-03-28 MED ORDER — ACETAMINOPHEN 325 MG PO TABS
650.0000 mg | ORAL_TABLET | Freq: Four times a day (QID) | ORAL | Status: DC | PRN
  Administered 2012-04-07: 650 mg
  Filled 2012-03-28: qty 2

## 2012-03-28 MED ORDER — MIDAZOLAM HCL 10 MG/2ML IJ SOLN: 4.0000 mg | Freq: Once | INTRAMUSCULAR | Status: DC

## 2012-03-28 MED ORDER — SULFAMETHOXAZOLE-TRIMETHOPRIM 400-80 MG/5ML IV SOLN
470.0000 mg | Freq: Four times a day (QID) | INTRAVENOUS | Status: DC
  Administered 2012-03-28 – 2012-03-29 (×4): 470 mg via INTRAVENOUS
  Filled 2012-03-28 (×9): qty 29.4

## 2012-03-28 MED ORDER — BIOTENE DRY MOUTH MT LIQD: 15.0000 mL | Freq: Two times a day (BID) | OROMUCOSAL | Status: DC

## 2012-03-28 MED ORDER — SODIUM CHLORIDE 0.9 % IV SOLN
3.0000 ug/kg/min | INTRAVENOUS | Status: DC
  Administered 2012-03-28: 3 ug/kg/min via INTRAVENOUS
  Administered 2012-03-29: 0.5 ug/kg/min via INTRAVENOUS
  Administered 2012-03-29: 1 ug/kg/min via INTRAVENOUS
  Filled 2012-03-28 (×2): qty 20

## 2012-03-28 MED ORDER — BIOTENE DRY MOUTH MT LIQD
15.0000 mL | Freq: Four times a day (QID) | OROMUCOSAL | Status: DC
  Administered 2012-03-28 – 2012-03-30 (×9): 15 mL via OROMUCOSAL

## 2012-03-28 MED ORDER — SODIUM CHLORIDE 0.9 % IV SOLN
1.0000 mg/h | INTRAVENOUS | Status: DC
  Administered 2012-03-28: 4 mg/h via INTRAVENOUS
  Administered 2012-03-28: 3 mg/h via INTRAVENOUS
  Administered 2012-03-29 – 2012-03-30 (×4): 5 mg/h via INTRAVENOUS
  Administered 2012-03-31 – 2012-04-01 (×2): 2 mg/h via INTRAVENOUS
  Administered 2012-04-01: 4 mg/h via INTRAVENOUS
  Administered 2012-04-02: 2 mg/h via INTRAVENOUS
  Administered 2012-04-03: 4 mg/h via INTRAVENOUS
  Administered 2012-04-03: 3 mg/h via INTRAVENOUS
  Administered 2012-04-04: 2 mg/h via INTRAVENOUS
  Filled 2012-03-28 (×13): qty 10

## 2012-03-28 MED ORDER — CHLORHEXIDINE GLUCONATE 0.12 % MT SOLN
15.0000 mL | Freq: Two times a day (BID) | OROMUCOSAL | Status: DC
  Administered 2012-03-28 – 2012-04-02 (×10): 15 mL via OROMUCOSAL
  Filled 2012-03-28 (×10): qty 15

## 2012-03-28 MED ORDER — METOPROLOL TARTRATE 1 MG/ML IV SOLN
2.5000 mg | INTRAVENOUS | Status: DC | PRN
  Administered 2012-04-02 – 2012-04-04 (×2): 5 mg via INTRAVENOUS
  Administered 2012-04-09 – 2012-04-11 (×3): 2.5 mg via INTRAVENOUS
  Filled 2012-03-28 (×4): qty 5

## 2012-03-28 MED ORDER — ARTIFICIAL TEARS OP OINT
1.0000 "application " | TOPICAL_OINTMENT | Freq: Three times a day (TID) | OPHTHALMIC | Status: DC
  Administered 2012-03-28 – 2012-03-30 (×6): 1 via OPHTHALMIC
  Filled 2012-03-28: qty 3.5

## 2012-03-28 MED ORDER — FENTANYL CITRATE 0.05 MG/ML IJ SOLN
INTRAMUSCULAR | Status: AC
  Administered 2012-03-28: 100 ug
  Filled 2012-03-28: qty 2

## 2012-03-28 MED ORDER — FENTANYL CITRATE 0.05 MG/ML IJ SOLN
INTRAMUSCULAR | Status: AC
  Filled 2012-03-28: qty 4

## 2012-03-28 MED ORDER — ALBUTEROL SULFATE (5 MG/ML) 0.5% IN NEBU
2.5000 mg | INHALATION_SOLUTION | Freq: Four times a day (QID) | RESPIRATORY_TRACT | Status: DC
  Administered 2012-03-28 – 2012-04-02 (×20): 2.5 mg via RESPIRATORY_TRACT
  Filled 2012-03-28 (×20): qty 0.5

## 2012-03-28 MED ORDER — ROCURONIUM BROMIDE 50 MG/5ML IV SOLN: 60.0000 mg | Freq: Once | INTRAVENOUS | Status: DC

## 2012-03-28 MED ORDER — SODIUM CHLORIDE 0.9 % IV SOLN
25.0000 ug/h | INTRAVENOUS | Status: DC
  Administered 2012-03-28: 100 ug/h via INTRAVENOUS
  Administered 2012-03-29: 175 ug/h via INTRAVENOUS
  Administered 2012-03-30 (×2): 200 ug/h via INTRAVENOUS
  Administered 2012-03-31: 100 ug/h via INTRAVENOUS
  Administered 2012-04-01: 150 ug/h via INTRAVENOUS
  Administered 2012-04-02 (×2): 180 ug/h via INTRAVENOUS
  Administered 2012-04-03: 220 ug/h via INTRAVENOUS
  Administered 2012-04-03: 180 ug/h via INTRAVENOUS
  Administered 2012-04-04: 130 ug/h via INTRAVENOUS
  Administered 2012-04-05: 100 ug/h via INTRAVENOUS
  Filled 2012-03-28 (×15): qty 50

## 2012-03-28 MED ORDER — ALPRAZOLAM 0.5 MG PO TABS
0.5000 mg | ORAL_TABLET | Freq: Once | ORAL | Status: AC
  Administered 2012-03-28: 0.5 mg via ORAL
  Filled 2012-03-28: qty 1

## 2012-03-28 MED ORDER — PANTOPRAZOLE SODIUM 40 MG IV SOLR
40.0000 mg | Freq: Every day | INTRAVENOUS | Status: DC
  Administered 2012-03-28 – 2012-03-31 (×4): 40 mg via INTRAVENOUS
  Filled 2012-03-28 (×5): qty 40

## 2012-03-28 MED ORDER — METHYLPREDNISOLONE SODIUM SUCC 125 MG IJ SOLR
80.0000 mg | Freq: Three times a day (TID) | INTRAMUSCULAR | Status: DC
  Administered 2012-03-28 (×2): 80 mg via INTRAVENOUS
  Filled 2012-03-28 (×3): qty 1.28

## 2012-03-28 MED ORDER — FENTANYL BOLUS VIA INFUSION
25.0000 ug | Freq: Four times a day (QID) | INTRAVENOUS | Status: DC | PRN
  Administered 2012-04-01 (×2): 50 ug via INTRAVENOUS
  Filled 2012-03-28: qty 100

## 2012-03-28 MED ORDER — INSULIN ASPART 100 UNIT/ML ~~LOC~~ SOLN
0.0000 [IU] | SUBCUTANEOUS | Status: DC
  Administered 2012-03-28 (×2): 4 [IU] via SUBCUTANEOUS
  Administered 2012-03-28: 7 [IU] via SUBCUTANEOUS
  Administered 2012-03-29 (×2): 4 [IU] via SUBCUTANEOUS
  Administered 2012-03-29: 3 [IU] via SUBCUTANEOUS
  Administered 2012-03-29 (×2): 4 [IU] via SUBCUTANEOUS
  Administered 2012-03-30: 3 [IU] via SUBCUTANEOUS
  Administered 2012-03-30 (×2): 4 [IU] via SUBCUTANEOUS
  Administered 2012-03-30 (×2): 3 [IU] via SUBCUTANEOUS
  Administered 2012-03-30: 4 [IU] via SUBCUTANEOUS
  Administered 2012-03-30 – 2012-03-31 (×2): 3 [IU] via SUBCUTANEOUS
  Administered 2012-03-31 (×4): 4 [IU] via SUBCUTANEOUS
  Administered 2012-03-31 – 2012-04-01 (×4): 3 [IU] via SUBCUTANEOUS
  Administered 2012-04-01: 4 [IU] via SUBCUTANEOUS
  Administered 2012-04-01: 3 [IU] via SUBCUTANEOUS

## 2012-03-28 MED ORDER — SODIUM BICARBONATE 8.4 % IV SOLN
INTRAVENOUS | Status: DC
  Administered 2012-03-28 – 2012-03-29 (×2): via INTRAVENOUS
  Filled 2012-03-28 (×5): qty 150

## 2012-03-28 MED ORDER — MIDAZOLAM HCL 2 MG/2ML IJ SOLN
INTRAMUSCULAR | Status: AC
  Administered 2012-03-28: 4 mg
  Filled 2012-03-28: qty 4

## 2012-03-28 MED ORDER — FENTANYL CITRATE 0.05 MG/ML IJ SOLN
100.0000 ug | Freq: Once | INTRAMUSCULAR | Status: AC
  Administered 2012-03-28: 100 ug via INTRAVENOUS

## 2012-03-28 NOTE — Progress Notes (Signed)
Pt started on Fentanyl and versed gtts at 12:21. When pt adequately sedated (subjectively and on BIS), Nimbex added at 12:27 per order to achieve 2/4 twitches and goal 40-60 BIS.

## 2012-03-28 NOTE — Progress Notes (Signed)
eLink Physician-Brief Progress Note Patient Name: Megan Espinoza DOB: 1955-11-23 MRN: [id number]  Date of Service  03/28/2012   HPI/Events of Note  Patient did not receive the xanax earlier in the night shift.  Now anxious and requesting something for anxiety.   eICU Interventions  One time dose of xanax 0.5 mg po   Intervention Category Minor Interventions: Agitation / anxiety - evaluation and management  Chaka Jefferys 03/28/2012, 6:20 AM

## 2012-03-28 NOTE — Procedures (Signed)
PROCEDURE NOTE: [place] CVL PLACEMENT  INDICATION:    Monitoring of central venous pressures and/or administration of medications optimally administered in central vein  CONSENT:   Risks of procedure as well as the alternatives were explained to the patient. Consent for procedure obtained. A time out was performed to review patient identification, procedure to be performed, correct patient position, medications/allergies/relevent history, required imaging and test results.  PROCEDURE  Maximum sterile technique was used including antiseptics, cap, gloves, gown, hand hygiene, mask and sheet.  Skin prep: Chlorhexidine; local anesthetic administered  A antimicrobial bonded/coated triple lumen catheter was placed in the [place] vein using the Seldinger technique.    EVALUATION:  Blood flow good  Complications: No apparent complications  Patient tolerated the procedure well.  Chest X-ray ordered to verify placement. Tip in RA. Pulled back 4 cm. No ptx   [name], MD PCCM service Mobile [phone]

## 2012-03-28 NOTE — Progress Notes (Signed)
ETT pulled back 2 cm per Dr. Sung Amabile post CXR. ETT at 22cm at the lip now.

## 2012-03-28 NOTE — Progress Notes (Signed)
PULMONARY  / CRITICAL CARE MEDICINE  Name: Megan Espinoza MRN: [id number] DOB: May 18, 1955    LOS: 4  REFERRING PROVIDER:  Dr Ardyth Harps  CHIEF COMPLAINT:  Dyspnea, cough  BRIEF PATIENT DESCRIPTION:  Admitted 12/05 by [facility] with acute febrile illness with dyspnea, NP cough. Transferred to ICU 12/08 for worsening hypoxemia. Supported with BiPAP but became unable to tolerate. Intubated 12/09 for rapid desaturation on NRB mask  EVENTS/STUDIES: 12/06 CT chest: Patchy diffuse bilateral pulmonary air space disease with air bronchograms. This is suspicious for an infectious or inflammatory process 12/09:  Intubated, ARDS protocol, NMB protocol, steroids initiated 12/09 FOB: minimal secretions, normal airways, specimens sent  LINES / TUBES: ETT 12/09 >>  [place] CVL 12/09 >>   CULTURES: MRSA screen 12/5 >> NEG Urine (clean catch) 12/5 >> diphtheroids (deemed contaminant) Legionella Ag 12/09 >>  Strep Ag 12/09 >>  Resp 12/09 >>  AFB smear 12/09 >>  Pneumocystis DFA 12/09 >>  Legionella 12/09 >>  resp virus panel 12/09 >>  Blood 12/5 >>    ANTIBIOTICS: Levaquin 12/5 >>  vanco 12/5 >> Aztreonam 12/5 >>  oseltamivir 12/08 >>  TMP/SMX 12/09 >>     LEVEL OF CARE:  ICU PRIMARY SERVICE:  PCCM CONSULTANTS: none CODE STATUS:  Full DIET:  none DVT Px:  SCDs GI Px:  PPI   INTERVAL HISTORY:  Increased distress. Pleading to be intubated as she cannot tolerate BiPAP any longer. Desaturates immediately on NRB mask   VITAL SIGNS: Temp:  [99 F (37.2 C)-102.1 F (38.9 C)] 99.3 F (37.4 C) (12/09 0809) Pulse Rate:  [87-117] 109  (12/09 1415) Resp:  [0-35] 12  (12/09 1415) BP: (96-153)/(41-94) 98/44 mmHg (12/09 1415) SpO2:  [80 %-100 %] 95 % (12/09 1415) FiO2 (%):  [0.9 %-100 %] 80 % (12/09 1410)  Intake/Output Summary (Last 24 hours) at 03/28/12 1506 Last data filed at 03/28/12 1400  Gross per 24 hour  Intake    770 ml  Output    635 ml  Net    135 ml    PHYSICAL  EXAMINATION: General:  resp distress Neuro: no focal deficits HEENT:  WNL Neck:  No JVD Cardiovascular:  RRR s M Lungs:  B rales, no wheeze Abdomen:  Obese, non-tender, + BS Musculoskeletal:  No significant edema Skin:  No rashes   Lab 03/27/12 0505 03/25/12 0615 03/24/12 2021  NA 140 142 139  K 3.9 3.6 3.1*  CL 104 104 101  CO2 30 26 27   BUN 15 15 14   CREATININE 0.74 0.76 1.06  GLUCOSE 88 109* 115*    Lab 03/28/12 0450 03/27/12 0505 03/25/12 0615  HGB 8.0* 8.1* 8.2*  HCT 26.6* 27.1* 27.6*  WBC 4.7 2.9* 2.7*  PLT 206 204 216   CXR: increasing bilateral AS dz   Principal Problem:  *Acute respiratory failure with hypoxia Active Problems:  presumed acute resp infection, etiology unknown  ARDS (adult respiratory distress syndrome)  Microcytic anemia  Steroid-induced hyperglycemia  Obesity (BMI 30.0-34.9)   DISC: This is presumably an acute infectious process but not classic for CAP. Possibility of an immunocompromised state wit opportunistic infection should be entertained. She has failed NPPV and required intubation. Severe ARDS physiology  Plan: Intubate (done) ARDS protocol NMB protocol X 48 hrs Sedation protocol FOB for BAL to r/o PCP, resp viruses, other opportunistic Empiric steroids started 12/09 SSI while on steroids Add TMP/SMX empirically Cont broad spectrum abx pending cx results Monitor CBC, chemistries  Follow CXR  Daughter updated in detail @ bedside  45 mins CCM time   [name], MD ; [facility] [phone].  After 5:30 PM or weekends, call [phone]

## 2012-03-28 NOTE — Procedures (Signed)
INTUBATION  Indication: Acute respiratory failure  Premeds: fentanyl, versed  RSI: Etomidate, rocuronium  Procedure: #3 MAC blade 8.0 ETT Cords easily visualized Passed on first attempt Taped @ 23 cm Confirmed with auscultation and EZ CAP F/U CXR revealed ETT tip @ carina - retracted 2 cm   [name], MD ; [facility] service Mobile [phone].  After 5:30 PM or weekends, call [phone]

## 2012-03-28 NOTE — Progress Notes (Addendum)
ANTIBIOTIC CONSULT NOTE - INITIAL  Pharmacy Consult:  Septra Indication:  PNA  Allergies  Allergen Reactions  . Penicillins Swelling  . Codeine Nausea And Vomiting    Patient Measurements: Height: 5\' 5"  (165.1 cm) Weight: 276 lb 10.8 oz (125.5 kg) IBW/kg (Calculated) : 57   Vital Signs: Temp: 99.3 F (37.4 C) (12/09 0809) Temp src: Axillary (12/09 0809) BP: 129/63 mmHg (12/09 1157) Pulse Rate: 104  (12/09 1157) Intake/Output from previous day: 12/08 0701 - 12/09 0700 In: 1063 [P.O.:360; I.V.:3; IV Piggyback:700] Out: 850 [Urine:850]  Labs:  [facility] 03/28/12 0450 03/27/12 0505  WBC 4.7 2.9*  HGB 8.0* 8.1*  PLT 206 204  LABCREA -- --  CREATININE -- 0.74   Estimated Creatinine Clearance: 104.6 ml/min (by C-G formula based on Cr of 0.74). No results found for this basename: VANCOTROUGH:2,VANCOPEAK:2,VANCORANDOM:2,GENTTROUGH:2,GENTPEAK:2,GENTRANDOM:2,TOBRATROUGH:2,TOBRAPEAK:2,TOBRARND:2,AMIKACINPEAK:2,AMIKACINTROU:2,AMIKACIN:2, in the last 72 hours   Microbiology: Recent Results (from the past 720 hour(s))  URINE CULTURE     Status: Normal   Collection Time   03/24/12  8:49 PM      Component Value Range Status Comment   Specimen Description URINE, CLEAN CATCH   Final    Special Requests NONE CX ADDED AT 2124 ON [id number]   Final    Culture  Setup Time 03/24/2012 22:54   Final    Colony Count >=100,000 COLONIES/ML   Final    Culture     Final    Value: DIPHTHEROIDS(CORYNEBACTERIUM SPECIES)     Note: Standardized susceptibility testing for this organism is not available.   Report Status 03/26/2012 FINAL   Final   CULTURE, BLOOD (ROUTINE X 2)     Status: Normal (Preliminary result)   Collection Time   03/24/12 10:45 PM      Component Value Range Status Comment   Specimen Description BLOOD RIGHT HAND   Final    Special Requests BOTTLES DRAWN AEROBIC AND ANAEROBIC 10CC   Final    Culture  Setup Time 03/25/2012 03:42   Final    Culture     Final    Value:        BLOOD  CULTURE RECEIVED NO GROWTH TO DATE CULTURE WILL BE HELD FOR 5 DAYS BEFORE ISSUING A FINAL NEGATIVE REPORT   Report Status PENDING   Incomplete   CULTURE, BLOOD (ROUTINE X 2)     Status: Normal (Preliminary result)   Collection Time   03/24/12 10:50 PM      Component Value Range Status Comment   Specimen Description BLOOD LEFT HAND   Final    Special Requests BOTTLES DRAWN AEROBIC AND ANAEROBIC   Final    Culture  Setup Time 03/25/2012 03:42   Final    Culture     Final    Value:        BLOOD CULTURE RECEIVED NO GROWTH TO DATE CULTURE WILL BE HELD FOR 5 DAYS BEFORE ISSUING A FINAL NEGATIVE REPORT   Report Status PENDING   Incomplete   MRSA PCR SCREENING     Status: Normal   Collection Time   03/26/12 10:00 PM      Component Value Range Status Comment   MRSA by PCR NEGATIVE  NEGATIVE Final     Medical History: Past Medical History  Diagnosis Date  . Obesity (BMI 30.0-34.9)       Assessment: 86 YOF admitted with chief complain of non-productive cough and was treated with Levaquin for CAP. Then developed a fever and was hypoxic.  Pharmacy consulted to  broaden pneumonia coverage for PCP with trimethoprim/sulfamethoxazole.  Will use actual body weight for dosing (125.5 kg); PCP dose of 15 mg/kg/day.  LVQ 12/6 >> Vanc 12/8 >> Azactam 12/8 >> Septra 12/9 >>  Goal of Therapy:  Resolution of infection  Plan:  - Septra 470 mg IV q 6 hrs.   - F/U cultures/plans.  Reece Leader, Pharm D 03/28/2012 1:00 PM

## 2012-03-28 NOTE — Progress Notes (Signed)
CRITICAL VALUE ALERT  Critical value received:  ABG: pH 7.32, CO2 60.5, pO2 86, Bicarb 30.9  Date of notification:  03/28/12  Time of notification:  2017  Critical value read back:yes  Nurse who received alert:  M. Renae Gloss, RN  MD notified (1st page):  Vassie Loll  Time of first page:  2018  MD notified (2nd page):  Time of second page:  Responding MD:  Vassie Loll  Time MD responded:  2018

## 2012-03-28 NOTE — Progress Notes (Addendum)
eLink Physician-Brief Progress Note Patient Name: CICLEY GANESH DOB: Aug 31, 1955 MRN: [id number]  Date of Service  03/28/2012   HPI/Events of Note   Oliguria being reported by RN  eICU Interventions  1L fluid bolus Check bmet stat at 23:57  Addendum at 03/29/2012 , 1:17 AM  Lab 03/29/12 0030 03/27/12 0505 03/25/12 0615 03/24/12 2021  NA 135 140 142 139  K 3.3* 3.9 -- --  CL 96 104 104 101  CO2 31 30 26 27   GLUCOSE 171* 88 109* 115*  BUN 20 15 15 14   CREATININE 1.56* 0.74 0.76 1.06  CALCIUM 8.1* 8.7 8.4 8.3*  MG -- -- -- --  PHOS -- -- -- --   D/w RN - She is developing renal failure  MAP 58, sbp 106 (she says patient bp went up with fluid bolus) CVP - not known scvo2 - not known  PLAN  - goal MAP > 65 - check cVP q4h - goal > 12 - check scvo2 - goal > 70% - start EGDT essentially       Intervention Category Major Interventions: Other:  Luca Dyar 03/28/2012, 11:57 PM

## 2012-03-28 NOTE — Progress Notes (Signed)
eLink Physician-Brief Progress Note Patient Name: Megan Espinoza DOB: 06-Apr-1956 MRN: [id number]  Date of Service  03/28/2012   HPI/Events of Note   resp acidosis ARDS protocol, on nimbex, RR at 35, on 6 cc/kg  eICU Interventions  Increase Tv to 7cc /kg= 400 cc --> peak pr 32 Add bicarb drip Rpt ABg in 3h Once pH 7.25 range, drop Tv again to 6c/kg    Intervention Category Major Interventions: Acid-Base disturbance - evaluation and management  ALVA,RAKESH V. 03/28/2012, 6:13 PM

## 2012-03-28 NOTE — Progress Notes (Signed)
eLink Physician-Brief Progress Note Patient Name: Megan Espinoza DOB: 1956-02-06 MRN: [id number]  Date of Service  03/28/2012   HPI/Events of Note  Nurse called with patient c/o of anxiety/anxiousness stating "she cannot take this anymore".  Nurse requesting sometime to assist in calming the patient.  Patient has tenuous resp status with drop in sats to the 70s easily.  Had received 0.5 mg xanax earlier today with no issues and with some response in calming the patient.   eICU Interventions  Plan: One time dose of xanax 0.5 mg   Intervention Category Minor Interventions: Agitation / anxiety - evaluation and management  Sheriece Jefcoat 03/28/2012, 12:26 AM

## 2012-03-28 NOTE — Procedures (Signed)
Bronchoscopy Procedure Note Megan Espinoza [id number] February 10, 1956  Procedure: Bronchoscopy Indications: Obtain specimens for culture and/or other diagnostic studies  Procedure Details Consent: Risks of procedure as well as the alternatives and risks of each were explained to the (patient/caregiver).  Consent for procedure obtained. Time Out: Verified patient identification, verified procedure, site/side was marked, verified correct patient position, special equipment/implants available, medications/allergies/relevent history reviewed, required imaging and test results available.  Performed  In preparation for procedure, bronchoscope lubricated. Sedation: Benzodiazepines, Muscle relaxants and Etomidate  Airway entered and the following bronchi were examined: RUL, RML, RLL, LUL, LLL and Bronchi.   Procedures performed: Brushings performed Bronchoscope removed.    Evaluation Hemodynamic Status: BP stable throughout; O2 sats: stable throughout Patient's Current Condition: stable Specimens: BAL from RML Complications: No apparent complications Patient did tolerate procedure well.  Findings: Normal airway anatomy Minimal secretions Blood tinged effluent  [name] 03/28/2012

## 2012-03-28 NOTE — Progress Notes (Signed)
Radial Aline attempted by 2 RTs. Left radial Aline placed and Aline clotted immediately, pulled out with large clot. RN notified will notify MD.

## 2012-03-28 NOTE — Progress Notes (Signed)
UR completed 

## 2012-03-28 NOTE — Progress Notes (Signed)
Pt choose to be intubated, stating "I can't take this (Bipap) anymore". At 10:30 Dr Sung Amabile to bedside.  Pt was intubated.  Meds  Ordered verbally by Dr Sung Amabile were 4 mg Versed, 100mg  Fentanly, 30 mg Etomidate and 60 mg rocuronium.  All meds witnessed by Guinevere Scarlet RN.  Another 100mg  fentanly push given while awaiting fentanyl gtt from pharmacy.

## 2012-03-29 ENCOUNTER — Inpatient Hospital Stay (HOSPITAL_COMMUNITY): Payer: MEDICAID

## 2012-03-29 DIAGNOSIS — E876 Hypokalemia: Secondary | ICD-10-CM

## 2012-03-29 LAB — CBC
Hemoglobin: 6.6 g/dL — CL (ref 12.0–15.0)
Hemoglobin: 8.1 g/dL — ABNORMAL LOW (ref 12.0–15.0)
MCH: 19.5 pg — ABNORMAL LOW (ref 26.0–34.0)
MCH: 20.2 pg — ABNORMAL LOW (ref 26.0–34.0)
MCH: 20.4 pg — ABNORMAL LOW (ref 26.0–34.0)
MCV: 65.4 fL — ABNORMAL LOW (ref 78.0–100.0)
MCV: 65.2 fL — ABNORMAL LOW (ref 78.0–100.0)
Platelets: 145 10*3/uL — ABNORMAL LOW (ref 150–400)
Platelets: 140 10*3/uL — ABNORMAL LOW (ref 150–400)
Platelets: 174 10*3/uL (ref 150–400)
RBC: 3.39 MIL/uL — ABNORMAL LOW (ref 3.87–5.11)
RBC: 3.97 MIL/uL (ref 3.87–5.11)
RDW: 23.1 % — ABNORMAL HIGH (ref 11.5–15.5)
WBC: 5.4 10*3/uL (ref 4.0–10.5)

## 2012-03-29 LAB — COMPREHENSIVE METABOLIC PANEL
AST: 42 U/L — ABNORMAL HIGH (ref 0–37)
BUN: 24 mg/dL — ABNORMAL HIGH (ref 6–23)
CO2: 29 mEq/L (ref 19–32)
Calcium: 7.8 mg/dL — ABNORMAL LOW (ref 8.4–10.5)
Chloride: 93 mEq/L — ABNORMAL LOW (ref 96–112)
Creatinine, Ser: 1.74 mg/dL — ABNORMAL HIGH (ref 0.50–1.10)
GFR calc Af Amer: 37 mL/min — ABNORMAL LOW (ref >90)
GFR calc non Af Amer: 32 mL/min — ABNORMAL LOW (ref >90)
Glucose, Bld: 190 mg/dL — ABNORMAL HIGH (ref 70–99)
Total Bilirubin: 0.2 mg/dL — ABNORMAL LOW (ref 0.3–1.2)

## 2012-03-29 LAB — BASIC METABOLIC PANEL
BUN: 20 mg/dL (ref 6–23)
CO2: 28 mEq/L (ref 19–32)
Calcium: 8 mg/dL — ABNORMAL LOW (ref 8.4–10.5)
Chloride: 96 mEq/L (ref 96–112)
Creatinine, Ser: 1.56 mg/dL — ABNORMAL HIGH (ref 0.50–1.10)
Creatinine, Ser: 1.84 mg/dL — ABNORMAL HIGH (ref 0.50–1.10)
GFR calc Af Amer: 42 mL/min — ABNORMAL LOW (ref >90)
Glucose, Bld: 169 mg/dL — ABNORMAL HIGH (ref 70–99)
Glucose, Bld: 171 mg/dL — ABNORMAL HIGH (ref 70–99)
Potassium: 3.3 mEq/L — ABNORMAL LOW (ref 3.5–5.1)

## 2012-03-29 LAB — PROCALCITONIN: Procalcitonin: 0.21 ng/mL

## 2012-03-29 LAB — BLOOD GAS, ARTERIAL
Acid-Base Excess: 4.2 mmol/L — ABNORMAL HIGH (ref 0.0–2.0)
Bicarbonate: 27.5 mEq/L — ABNORMAL HIGH (ref 20.0–24.0)
Drawn by: 31297
Drawn by: 31297
FIO2: 0.8 %
MECHVT: 340 mL
PEEP: 10 cmH2O
Patient temperature: 98.6
Patient temperature: 97.3
RATE: 35 resp/min
RATE: 35 resp/min
TCO2: 34.4 mmol/L (ref 0–100)
pH, Arterial: 7.371 (ref 7.350–7.450)

## 2012-03-29 LAB — CARBOXYHEMOGLOBIN
Carboxyhemoglobin: 1.2 % (ref 0.5–1.5)
Carboxyhemoglobin: 1.2 % (ref 0.5–1.5)
Methemoglobin: 1.2 % (ref 0.0–1.5)
O2 Saturation: 76.8 %
Total hemoglobin: 7.7 g/dL — ABNORMAL LOW (ref 12.0–16.0)

## 2012-03-29 LAB — HIV ANTIBODY (ROUTINE TESTING W REFLEX): HIV: NONREACTIVE

## 2012-03-29 LAB — LEGIONELLA ANTIGEN, URINE

## 2012-03-29 LAB — ANA: Anti Nuclear Antibody(ANA): POSITIVE — AB

## 2012-03-29 LAB — GLUCOSE, CAPILLARY: Glucose-Capillary: 193 mg/dL — ABNORMAL HIGH (ref 70–99)

## 2012-03-29 MED ORDER — SODIUM CHLORIDE 0.9 % IV BOLUS (SEPSIS)
500.0000 mL | Freq: Once | INTRAVENOUS | Status: AC
  Administered 2012-03-29: 500 mL via INTRAVENOUS

## 2012-03-29 MED ORDER — METHYLPREDNISOLONE SODIUM SUCC 125 MG IJ SOLR
80.0000 mg | Freq: Three times a day (TID) | INTRAMUSCULAR | Status: DC
  Administered 2012-03-29 – 2012-03-31 (×6): 80 mg via INTRAVENOUS
  Filled 2012-03-29 (×9): qty 1.28

## 2012-03-29 MED ORDER — VASOPRESSIN 20 UNIT/ML IJ SOLN: 0.0300 [IU]/min | INTRAVENOUS | Status: DC | PRN

## 2012-03-29 MED ORDER — LEVOFLOXACIN IN D5W 750 MG/150ML IV SOLN
750.0000 mg | INTRAVENOUS | Status: DC
  Administered 2012-03-30: 750 mg via INTRAVENOUS
  Filled 2012-03-29: qty 150

## 2012-03-29 MED ORDER — POTASSIUM CHLORIDE 10 MEQ/50ML IV SOLN
10.0000 meq | INTRAVENOUS | Status: AC
  Administered 2012-03-29 (×4): 10 meq via INTRAVENOUS
  Filled 2012-03-29 (×4): qty 50

## 2012-03-29 MED ORDER — NOREPINEPHRINE BITARTRATE 1 MG/ML IJ SOLN
2.0000 ug/min | INTRAVENOUS | Status: DC
  Administered 2012-03-29: 2 ug/min via INTRAVENOUS
  Administered 2012-03-30: 4 ug/min via INTRAVENOUS
  Administered 2012-03-31: 3 ug/min via INTRAVENOUS
  Administered 2012-04-01: 1 ug/min via INTRAVENOUS
  Filled 2012-03-29 (×4): qty 4

## 2012-03-29 MED ORDER — POTASSIUM CHLORIDE 10 MEQ/50ML IV SOLN
10.0000 meq | INTRAVENOUS | Status: AC
  Administered 2012-03-29 (×4): 10 meq via INTRAVENOUS
  Filled 2012-03-29: qty 50
  Filled 2012-03-29: qty 100
  Filled 2012-03-29: qty 50

## 2012-03-29 MED ORDER — NOREPINEPHRINE BITARTRATE 1 MG/ML IJ SOLN
2.0000 ug/min | INTRAMUSCULAR | Status: DC | PRN
  Filled 2012-03-29: qty 4

## 2012-03-29 MED ORDER — HYDROCORTISONE SOD SUCCINATE 100 MG IJ SOLR
50.0000 mg | Freq: Four times a day (QID) | INTRAMUSCULAR | Status: DC
  Administered 2012-03-29: 50 mg via INTRAVENOUS
  Filled 2012-03-29 (×6): qty 1

## 2012-03-29 MED ORDER — DOBUTAMINE IN D5W 4-5 MG/ML-% IV SOLN: 2.5000 ug/kg/min | INTRAVENOUS | Status: DC | PRN

## 2012-03-29 MED ORDER — VANCOMYCIN HCL 10 G IV SOLR
1750.0000 mg | INTRAVENOUS | Status: DC
  Administered 2012-03-30: 1750 mg via INTRAVENOUS
  Filled 2012-03-29 (×2): qty 1750

## 2012-03-29 MED ORDER — SODIUM CHLORIDE 0.9 % IV BOLUS (SEPSIS): 500.0000 mL | INTRAVENOUS | Status: DC | PRN

## 2012-03-29 MED ORDER — DIPHENHYDRAMINE HCL 50 MG/ML IJ SOLN: 25.0000 mg | Freq: Four times a day (QID) | INTRAMUSCULAR | Status: DC | PRN

## 2012-03-29 NOTE — Progress Notes (Signed)
0200 dose of Solu-cortef given; MD aware. Hold further scheduled doses at this time.  Cyndie Chime Joslin

## 2012-03-29 NOTE — Progress Notes (Signed)
eLink Physician-Brief Progress Note Patient Name: Megan Espinoza DOB: 12-30-1955 MRN: [id number]  Date of Service  03/29/2012   HPI/Events of Note    Lab 03/29/12 0225 03/28/12 0450 03/27/12 0505 03/25/12 0615 03/24/12 2021  HGB 6.6* 8.0* 8.1* 8.2* 7.7*     eICU Interventions  1.4 gm drop in hgb. Per RN no obvious bleed. SCvo2 > 70%.  PLAN  - 1 unit PRBC and reassess.  - cancel 2nd unit prbc ordered earlier   - decide on 2nd unit prbc based on respinse to first unit   Intervention Category Intermediate Interventions: Diagnostic test evaluation  Cohan Stipes 03/29/2012, 3:05 AM

## 2012-03-29 NOTE — Progress Notes (Signed)
Transfusion 2 units PRBC ordered for Hb 6.6 (last 8.0) in setting of sepsis / hypovolemia.

## 2012-03-29 NOTE — Progress Notes (Signed)
Nursing 1020 Dr. Sung Amabile made aware that lab results have posted and that the patient's K is 2.5.  Order for 4 runs of KCl IV.

## 2012-03-29 NOTE — Progress Notes (Signed)
CRITICAL VALUE ALERT  Critical value received:  Hgb 6.6  Date of notification:  03/29/12  Time of notification:  0255  Critical value read back:yes  Nurse who received alert:  M. Renae Gloss, RN  MD notified (1st page):  Dr Herma Carson  Time of first page:  0256  MD notified (2nd page):  Time of second page:  Responding MD:  Dr Herma Carson  Time MD responded:  [phone]

## 2012-03-29 NOTE — Progress Notes (Signed)
Nursing 1630 Dr. Sung Amabile made aware of lab results and UOP.  Order for additional 4 runs of KCl IVPB.  Will continue to assess UOP.

## 2012-03-29 NOTE — Progress Notes (Signed)
MD notified of pts total hemoglobin 6.5 and Coox of 76.8. CBC drawn. Will continue to monitor pt.   Cyndie Chime Niles

## 2012-03-29 NOTE — Progress Notes (Signed)
ANTIBIOTIC CONSULT NOTE - INITIAL  Pharmacy Consult:  Vancomycin / Azactam Indication:  PNA  Allergies  Allergen Reactions  . Penicillins Swelling  . Codeine Nausea And Vomiting    Patient Measurements: Height: 5\' 5"  (165.1 cm) Weight: 291 lb 7.2 oz (132.2 kg) IBW/kg (Calculated) : 57   Vital Signs: Temp: 97.4 F (36.3 C) (12/10 1137) Temp src: Axillary (12/10 1137) BP: 123/46 mmHg (12/10 1500) Pulse Rate: 90  (12/10 1500) Intake/Output from previous day: 12/09 0701 - 12/10 0700 In: 7181.5 [I.V.:1514; Blood:350; IV Piggyback:4317.5] Out: 744 [Urine:744]  Labs:  [facility] 03/29/12 1517 03/29/12 0930 03/29/12 0225 03/29/12 0030 03/27/12 0505  WBC 9.2 6.3 5.4 -- --  HGB 8.1* 7.4* 6.6* -- --  PLT 174 140* 145* -- --  LABCREA -- -- -- -- --  CREATININE -- 1.74* -- 1.56* 0.74   Estimated Creatinine Clearance: 49.6 ml/min (by C-G formula based on Cr of 1.74). No results found for this basename: VANCOTROUGH:2,VANCOPEAK:2,VANCORANDOM:2,GENTTROUGH:2,GENTPEAK:2,GENTRANDOM:2,TOBRATROUGH:2,TOBRAPEAK:2,TOBRARND:2,AMIKACINPEAK:2,AMIKACINTROU:2,AMIKACIN:2, in the last 72 hours   Microbiology: Recent Results (from the past 720 hour(s))  URINE CULTURE     Status: Normal   Collection Time   03/24/12  8:49 PM      Component Value Range Status Comment   Specimen Description URINE, CLEAN CATCH   Final    Special Requests NONE CX ADDED AT 2124 ON 119147   Final    Culture  Setup Time 03/24/2012 22:54   Final    Colony Count >=100,000 COLONIES/ML   Final    Culture     Final    Value: DIPHTHEROIDS(CORYNEBACTERIUM SPECIES)     Note: Standardized susceptibility testing for this organism is not available.   Report Status 03/26/2012 FINAL   Final   CULTURE, BLOOD (ROUTINE X 2)     Status: Normal (Preliminary result)   Collection Time   03/24/12 10:45 PM      Component Value Range Status Comment   Specimen Description BLOOD RIGHT HAND   Final    Special Requests BOTTLES DRAWN AEROBIC AND  ANAEROBIC 10CC   Final    Culture  Setup Time 03/25/2012 03:42   Final    Culture     Final    Value:        BLOOD CULTURE RECEIVED NO GROWTH TO DATE CULTURE WILL BE HELD FOR 5 DAYS BEFORE ISSUING A FINAL NEGATIVE REPORT   Report Status PENDING   Incomplete   CULTURE, BLOOD (ROUTINE X 2)     Status: Normal (Preliminary result)   Collection Time   03/24/12 10:50 PM      Component Value Range Status Comment   Specimen Description BLOOD LEFT HAND   Final    Special Requests BOTTLES DRAWN AEROBIC AND ANAEROBIC   Final    Culture  Setup Time 03/25/2012 03:42   Final    Culture     Final    Value:        BLOOD CULTURE RECEIVED NO GROWTH TO DATE CULTURE WILL BE HELD FOR 5 DAYS BEFORE ISSUING A FINAL NEGATIVE REPORT   Report Status PENDING   Incomplete   MRSA PCR SCREENING     Status: Normal   Collection Time   03/26/12 10:00 PM      Component Value Range Status Comment   MRSA by PCR NEGATIVE  NEGATIVE Final   PNEUMOCYSTIS JIROVECI SMEAR BY DFA     Status: Normal   Collection Time   03/28/12 11:50 AM      Component  Value Range Status Comment   Specimen Source-PJSRC BRONCHIAL WASHINGS   Final    Pneumocystis jiroveci Ag NEGATIVE   Final Performed at [facility] [place]    Medical History: Past Medical History  Diagnosis Date  . Obesity (BMI 30.0-34.9)       Assessment: 56 YOF on levaquin, vancomycin and azactam for PNA.  Patient now with acute renal insuff, Scr up to 1.74 from 0.74 on 12/7.  Will adjust antibiotic doses.      Goal of Therapy:  Vancomycin trough level 15-20 mcg/ml   Plan:  Change Vancomycin to 1750mg  IV q24hrs  Change Levaquin to 750mg  IV q48hrs.  Monitor renal fxn, clinical course, vanc trough if indicated   Wendie Simmer, PharmD, BCPS Clinical Pharmacist  Pager: [phone]

## 2012-03-29 NOTE — Progress Notes (Signed)
Nursing 0900 RN stopped Nimbex gtt this AM due to 0/4 twitches in two sites per order.  Once patient resumed twitching, Nimbex gtt resumed at 50% of the previous rate per order.  Dr. Sung Amabile made aware of this.  Dr. Sung Amabile also made aware that RN needing to increase analgesia/sedation to keep BIS<60.  RN having to adjust levels due to decreases in BP with increases in the drips.  Dr. Sung Amabile to order Levophed drip to keep MAP>60 in order to adequately sedate patient. Will continue to assess vital signs and BIS and will start vasopressors if needed.  MD also made aware of patient's low UOP, no new orders.  MD aware of CVP, fluid boluses discontinued by MD.

## 2012-03-29 NOTE — Progress Notes (Signed)
CRITICAL VALUE ALERT  Critical value received:  K 2.5  Date of notification:  03/29/2012   Time of notification:  1019  Critical value read back:yes  Nurse who received alert:  Quintavius Niebuhr SMALL   MD notified (1st page):  Dr. Sung Amabile  Time of first page:  1020  MD notified (2nd page):  Time of second page:  Responding MD:  Dr. Sung Amabile  Time MD responded:  1020

## 2012-03-29 NOTE — Progress Notes (Signed)
Nursing 1215 Hgb 7.4 and Hct 24, Dr. Sung Amabile made aware, no further orders.  Dr. Sung Amabile made aware that RN was notified by lab that the respiratory specimen sent to outside lab on 12/9 will have the report reposted today.  Dr. Sung Amabile made aware that RT was unable to place arterial line, no further orders at this time.  OG is to low wall intermittent suction, will not start TF today per MD.

## 2012-03-29 NOTE — Progress Notes (Signed)
PULMONARY  / CRITICAL CARE MEDICINE  Name: Megan Espinoza MRN: [id number] DOB: December 23, 1955    LOS: 5  REFERRING PROVIDER:  Dr Ardyth Harps  CHIEF COMPLAINT:  Dyspnea, cough  BRIEF PATIENT DESCRIPTION:  Admitted 12/05 by [facility] with acute febrile illness with dyspnea, NP cough. Transferred to ICU 12/08 for worsening hypoxemia. Supported with BiPAP but became unable to tolerate. Intubated 12/09 for rapid desaturation on NRB mask  EVENTS/STUDIES: 12/06 CT chest: Patchy diffuse bilateral pulmonary air space disease with air bronchograms. This is suspicious for an infectious or inflammatory process 12/09:  Intubated, ARDS protocol, NMB protocol, steroids initiated 12/09 FOB: minimal secretions, normal airways, specimens sent 12/10: 1 unit PRBCs for Hgb 6.6.  Worsening renal function. Vasopressors initiated for MAP < 60 mmHg  LINES / TUBES: ETT 12/09 >>  R [place] CVL 12/09 >>   CULTURES: MRSA screen 12/5 >> NEG Urine (clean catch) 12/5 >> diphtheroids (deemed contaminant) Legionella Ag 12/09 >> NEG Strep Ag 12/09 >> NEG Pneumocystis DFA 12/09 >> NEG Resp 12/09 >>  AFB smear 12/09 >>  Fungal smear/cx 12/09 >>  Legionella cx 12/09 >>  resp virus panel 12/09 >>  Blood 12/5 >>    ANTIBIOTICS: TMP/SMX 12/09 >> 12/10 oseltamivir 12/08 >> 12/10 Levaquin 12/5 >>  vanco 12/5 >> Aztreonam 12/5 >>     LEVEL OF CARE:  ICU PRIMARY SERVICE:  PCCM CONSULTANTS: none CODE STATUS:  Full DIET:  none DVT Px:  SCDs GI Px:  PPI   INTERVAL HISTORY:  Sedated, paralyzed, intubated   VITAL SIGNS: Temp:  [97.3 F (36.3 C)-98.4 F (36.9 C)] 97.3 F (36.3 C) (12/10 0715) Pulse Rate:  [82-117] 88  (12/10 1000) Resp:  [0-35] 35  (12/10 0930) BP: (85-129)/(30-68) 108/50 mmHg (12/10 1000) SpO2:  [90 %-98 %] 97 % (12/10 1000) FiO2 (%):  [70 %-100 %] 70 % (12/10 1000) Weight:  [132.2 kg (291 lb 7.2 oz)] 132.2 kg (291 lb 7.2 oz) (12/10 0615)  Intake/Output Summary (Last 24 hours) at 03/29/12  1013 Last data filed at 03/29/12 1000  Gross per 24 hour  Intake 7235.4 ml  Output    772 ml  Net 6463.4 ml    PHYSICAL EXAMINATION: General:  Sedated, paralyzed, intubated Neuro: BIS 60 HEENT:  WNL Neck:  No JVD Cardiovascular:  RRR s M Lungs:  B rales, no wheeze Abdomen:  Obese, non-tender, + BS Musculoskeletal:  No significant edema, warm, 2+ DP pulses Skin:  No rashes   Lab 03/29/12 0030 03/27/12 0505 03/25/12 0615  NA 135 140 142  K 3.3* 3.9 3.6  CL 96 104 104  CO2 31 30 26   BUN 20 15 15   CREATININE 1.56* 0.74 0.76  GLUCOSE 171* 88 109*    Lab 03/29/12 0225 03/28/12 0450 03/27/12 0505  HGB 6.6* 8.0* 8.1*  HCT 22.1* 26.6* 27.1*  WBC 5.4 4.7 2.9*  PLT 145* 206 204   CXR: NSC ARDS pattern   Principal Problem:  *Acute respiratory failure with hypoxia Active Problems:  presumed acute resp infection, etiology unknown  ARDS (adult respiratory distress syndrome)  Microcytic anemia  1 u PRBCs 12/10  Steroid-induced hyperglycemia  Controlled on SSI  Obesity (BMI 30.0-34.9)  Hypotension/shock  Suspect more attributable to + pressure ventilation/high PEEP and heavy sedation than septic shock  Acute renal insuff due to hypotension/hypoperfusion  Nonoliguric now,  No indication for RRT   DISC: Remains critically ill. Etiology of ARDS remains unclear  Plan: Cont ARDS protocol Cont NMB protocol X  48 hrs Cont sedation protocol - BIS goal 40-60 F/U outstanding micro Abx as per dashboard above Cont empiric steroids started 12/09 Cont SSI while on steroids Monitor CBC, chemistries as indicated NE to maintain MAP > 65 mmHg Follow CXR  Daughter updated in detail @ bedside  45 mins CCM time   [name], MD ; [facility] service Mobile [phone].  After 5:30 PM or weekends, call [phone]

## 2012-03-29 NOTE — Progress Notes (Signed)
INITIAL NUTRITION ASSESSMENT  DOCUMENTATION CODES Per approved criteria  -Morbid Obesity    INTERVENTION: Recommend Initiate Oxepa @ 20 ml/hr via OGT and provide 60 ml Prostat QID.  At goal rate, tube feeding regimen will provide 1520 kcal (70% of kcal needs/26 kcal/kg of IBW), 150 grams of protein (> 100% of needs), and 565 ml of H2O.    NUTRITION DIAGNOSIS: Inadequate oral intake related to inability to eat as evidenced by NPO status.  Goal: Enteral nutrition to provide 60-70% of estimated calorie needs (22-25 kcals/kg ideal body weight) and >/= 90% of estimated protein needs, based on ASPEN guidelines for permissive underfeeding in critically ill obese individuals.  Monitor:  Vent status, weight trend  Reason for Assessment: Ventilator  56 y.o. female  Admitting Dx: Acute respiratory failure with hypoxia  ASSESSMENT: Patient is currently intubated on ventilator support due to presumed acute respiratory infection, etiology unknown. Pt is on ARDS protocol and Nimbex. Pt starting pressors today per RN to help with BP control. Pt discussed during ICU rounds and with RN.  Asked MD about starting TF, per MD he will look into it.  MV: 14.1 Temp:Temp (24hrs), Avg:97.8 F (36.6 C), Min:97.3 F (36.3 C), Max:98.4 F (36.9 C)   Height: Ht Readings from Last 1 Encounters:  03/25/12 5\' 5"  (1.651 m)   Weight: Wt Readings from Last 1 Encounters:  03/29/12 291 lb 7.2 oz (132.2 kg)   Ideal Body Weight: 56.8 kg  % Ideal Body Weight: 233%  Wt Readings from Last 10 Encounters:  03/29/12 291 lb 7.2 oz (132.2 kg)   Usual Body Weight: unknown  % Usual Body Weight: -  BMI:  Body mass index is 48.50 kg/(m^2). Extreme Obesity Class III  Estimated Nutritional Needs: Kcal: 2157 Protein: >/= 142 grams Fluid: > 2 L/day  Skin: no issues noted  Diet Order:   NPO  EDUCATION NEEDS: -No education needs identified at this time   Intake/Output Summary (Last 24 hours) at 03/29/12  0927 Last data filed at 03/29/12 0715  Gross per 24 hour  Intake 6781.5 ml  Output    684 ml  Net 6097.5 ml   Last BM: 12/7   Labs:   Lab 03/29/12 0030 03/27/12 0505 03/25/12 0615  NA 135 140 142  K 3.3* 3.9 3.6  CL 96 104 104  CO2 31 30 26   BUN 20 15 15   CREATININE 1.56* 0.74 0.76  CALCIUM 8.1* 8.7 8.4  MG -- -- --  PHOS -- -- --  GLUCOSE 171* 88 109*    CBG (last 3)   Basename 03/29/12 0820 03/29/12 0334 03/28/12 2337  GLUCAP 193* 172* 162*    Scheduled Meds:   . ipratropium  0.5 mg Nebulization Q6H   And  . albuterol  2.5 mg Nebulization Q6H  . antiseptic oral rinse  15 mL Mouth Rinse QID  . artificial tears  1 application Both Eyes Q8H  . aztreonam  1 g Intravenous Q8H  . chlorhexidine  15 mL Mouth Rinse BID  . [COMPLETED] cisatracurium  5 mg Intravenous Once  . etomidate  30 mg Intravenous Once  . [EXPIRED] fentaNYL      . [COMPLETED] fentaNYL      . [COMPLETED] fentaNYL  100 mcg Intravenous Once  . [EXPIRED] hydrALAZINE      . hydrocortisone sodium succinate  50 mg Intravenous Q6H  . insulin aspart  0-20 Units Subcutaneous Q4H  . levofloxacin (LEVAQUIN) IV  750 mg Intravenous Q24H  . [COMPLETED]  metoprolol      . [COMPLETED] midazolam      . oseltamivir  75 mg Oral BID  . pantoprazole (PROTONIX) IV  40 mg Intravenous QHS  . rocuronium  60 mg Intravenous Once  . [COMPLETED] sodium chloride  1,000 mL Intravenous Once  . [COMPLETED] sodium chloride  500 mL Intravenous Once  . sodium chloride  3 mL Intravenous Q12H  . sulfamethoxazole-trimethoprim  470 mg Intravenous Q6H  . vancomycin  1,250 mg Intravenous Q12H  . [DISCONTINUED] acetaminophen  650 mg Oral Once  . [DISCONTINUED] albuterol  2.5 mg Nebulization Q4H  . [DISCONTINUED] antiseptic oral rinse  15 mL Mouth Rinse q12n4p  . [DISCONTINUED] chlorhexidine  15 mL Mouth Rinse BID  . [DISCONTINUED] ferrous sulfate  325 mg Oral BID WC  . [DISCONTINUED] ipratropium  0.5 mg Nebulization Q4H  .  [DISCONTINUED] methylPREDNISolone (SOLU-MEDROL) injection  80 mg Intravenous Q8H  . [DISCONTINUED] midazolam  4 mg Intravenous Once  . [DISCONTINUED] pantoprazole  40 mg Oral Daily   Continuous Infusions:   . cisatracurium (NIMBEX) infusion 1.5 mcg/kg/min (03/29/12 0700)  . fentaNYL infusion INTRAVENOUS 75 mcg/hr (03/29/12 0700)  . midazolam (VERSED) infusion 3 mg/hr (03/29/12 0700)  .  sodium bicarbonate infusion 1000 mL 75 mL/hr at 03/29/12 0556  . vasopressin (PITRESSIN) infusion - *FOR SHOCK*    . [DISCONTINUED] dextrose 5 % and 0.45 % NaCl with KCl 20 mEq/L 50 mL/hr at 03/28/12 1700    Past Medical History  Diagnosis Date  . Obesity (BMI 30.0-34.9)     Past Surgical History  Procedure Date  . Cholecystectomy   . Hernia repair       Kendell Bane RD, LDN, CNSC [phone] Pager [phone] After Hours Pager

## 2012-03-30 ENCOUNTER — Inpatient Hospital Stay (HOSPITAL_COMMUNITY): Payer: MEDICAID

## 2012-03-30 DIAGNOSIS — N289 Disorder of kidney and ureter, unspecified: Secondary | ICD-10-CM | POA: Diagnosis not present

## 2012-03-30 LAB — GLUCOSE, CAPILLARY
Glucose-Capillary: 158 mg/dL — ABNORMAL HIGH (ref 70–99)
Glucose-Capillary: 135 mg/dL — ABNORMAL HIGH (ref 70–99)
Glucose-Capillary: 134 mg/dL — ABNORMAL HIGH (ref 70–99)

## 2012-03-30 LAB — BASIC METABOLIC PANEL
BUN: 32 mg/dL — ABNORMAL HIGH (ref 6–23)
CO2: 27 mEq/L (ref 19–32)
Chloride: 100 mEq/L (ref 96–112)
Creatinine, Ser: 1.87 mg/dL — ABNORMAL HIGH (ref 0.50–1.10)

## 2012-03-30 LAB — CBC
HCT: 25.6 % — ABNORMAL LOW (ref 36.0–46.0)
MCHC: 31.3 g/dL (ref 30.0–36.0)
MCV: 64.3 fL — ABNORMAL LOW (ref 78.0–100.0)
RDW: 23.2 % — ABNORMAL HIGH (ref 11.5–15.5)
WBC: 14.1 10*3/uL — ABNORMAL HIGH (ref 4.0–10.5)

## 2012-03-30 LAB — RESPIRATORY VIRUS PANEL
Adenovirus: NOT DETECTED
Influenza A H1: NOT DETECTED
Influenza A H3: NOT DETECTED
Influenza B: NOT DETECTED
Metapneumovirus: NOT DETECTED
Parainfluenza 3: NOT DETECTED
Respiratory Syncytial Virus A: NOT DETECTED
Rhinovirus: NOT DETECTED

## 2012-03-30 LAB — LEGIONELLA ANTIGEN, URINE: Legionella Antigen, Urine: NEGATIVE

## 2012-03-30 MED ORDER — POTASSIUM CHLORIDE 20 MEQ/15ML (10%) PO LIQD
ORAL | Status: AC
  Filled 2012-03-30: qty 15

## 2012-03-30 MED ORDER — VITAL AF 1.2 CAL PO LIQD
1000.0000 mL | ORAL | Status: DC
  Administered 2012-03-30: 1000 mL
  Filled 2012-03-30 (×3): qty 1000

## 2012-03-30 MED ORDER — OSELTAMIVIR PHOSPHATE 6 MG/ML PO SUSR
75.0000 mg | Freq: Two times a day (BID) | ORAL | Status: AC
  Administered 2012-03-30 – 2012-04-03 (×10): 75 mg
  Filled 2012-03-30 (×11): qty 12.5

## 2012-03-30 MED ORDER — POTASSIUM CHLORIDE 20 MEQ/15ML (10%) PO LIQD
40.0000 meq | Freq: Once | ORAL | Status: AC
  Administered 2012-03-30: 40 meq
  Filled 2012-03-30: qty 30

## 2012-03-30 MED ORDER — POTASSIUM CHLORIDE 20 MEQ/15ML (10%) PO LIQD
ORAL | Status: AC
  Filled 2012-03-30: qty 30

## 2012-03-30 NOTE — Progress Notes (Addendum)
PULMONARY  / CRITICAL CARE MEDICINE  Name: Megan Espinoza MRN: [id number] DOB: [birth year]    LOS: 6  REFERRING PROVIDER:  Dr Ardyth Harps  CHIEF COMPLAINT:  Dyspnea, cough  BRIEF PATIENT DESCRIPTION:  Admitted 12/05 by [facility] with acute febrile illness with dyspnea, NP cough. Transferred to ICU 12/08 for worsening hypoxemia. Supported with BiPAP but became unable to tolerate. Intubated 12/09 for rapid desaturation on NRB mask  EVENTS/STUDIES: 12/06 CT chest: Patchy diffuse bilateral pulmonary air space disease with air bronchograms. This is suspicious for an infectious or inflammatory process 12/09:  Intubated, ARDS protocol, NMB protocol, steroids initiated 12/09 FOB: minimal secretions, normal airways, specimens sent 12/10: 1 unit PRBCs for Hgb 6.6.  Worsening renal function. Vasopressors initiated for MAP < 60 mmHg 12/11:  NMBs stopped. ANA negative  LINES / TUBES: ETT 12/09 >>  R [place] CVL 12/09 >>   CULTURES: MRSA screen 12/5 >> NEG Urine (clean catch) 12/5 >> diphtheroids (deemed contaminant) Legionella Ag 12/09 >> NEG Strep Ag 12/09 >> NEG Pneumocystis DFA 12/09 >> NEG AFB smear 12/09 >> NEG Fungal smear/cx 12/09 >> NEG Legionella cx 12/09 >> NEG resp virus panel 12/09 >> Influenza A POSITIVE Resp 12/09 >> NOS >>  Blood 12/5 >> ngtd >>    ANTIBIOTICS: Aztreonam 12/5 >> 12/11 oseltamivir 12/08 >> 12/10 TMP/SMX 12/09 >> 12/10 oseltamivir 12/11 >>  Levofloxacin 12/5 >>  vanco 12/5 >>   LEVEL OF CARE:  ICU PRIMARY SERVICE:  PCCM CONSULTANTS: none CODE STATUS:  Full  DIET:  Trickle TFs DVT Px:  SCDs GI Px:  PPI   INTERVAL HISTORY:  Sedated, paralyzed, intubated   VITAL SIGNS: Temp:  [94.7 F (34.8 C)-97.4 F (36.3 C)] 94.7 F (34.8 C) (12/11 0800) Pulse Rate:  [73-93] 79  (12/11 0800) Resp:  [0-35] 34  (12/11 0800) BP: (106-144)/(46-95) 138/55 mmHg (12/11 0800) SpO2:  [89 %-98 %] 93 % (12/11 0800) FiO2 (%):  [70 %] 70 % (12/11 0800)  Intake/Output  Summary (Last 24 hours) at 03/30/12 0958 Last data filed at 03/30/12 0700  Gross per 24 hour  Intake 2003.04 ml  Output   1215 ml  Net 788.04 ml    PHYSICAL EXAMINATION: General:  Sedated, paralyzed, intubated Neuro: BIS 55 HEENT:  WNL Neck:  No JVD Cardiovascular:  RRR s M Lungs:  B rales, no wheeze Abdomen:  Obese, non-tender, + BS Musculoskeletal:  No significant edema, warm, 2+ DP pulses Skin:  No rashes   Lab 03/30/12 0430 03/29/12 1517 03/29/12 0930  NA 137 133* 135  K 3.4* 3.0* 2.5*  CL 100 93* 93*  CO2 27 28 29   BUN 32* 27* 24*  CREATININE 1.87* 1.84* 1.74*  GLUCOSE 148* 169* 190*    Lab 03/30/12 0430 03/29/12 1517 03/29/12 0930  HGB 8.0* 8.1* 7.4*  HCT 25.6* 25.9* 24.0*  WBC 14.1* 9.2 6.3  PLT 209 174 140*   CXR: NSC ARDS pattern   Principal Problem:  *Acute respiratory failure with hypoxia Active Problems:  presumed acute resp infection, etiology unknown  ARDS (adult respiratory distress syndrome) Etiology remains unclear. All cx data negative  Microcytic anemia  1 u PRBCs 12/10  Steroid-induced hyperglycemia  Controlled on SSI  Obesity (BMI 30.0-34.9)  Hypotension/shock  Suspect more attributable to + pressure ventilation/high PEEP and heavy sedation than septic shock  Acute renal insuff due to hypotension/hypoperfusion  Nonoliguric, Cr stable  No indication for RRT   DISC: Remains critically ill. Etiology of ARDS remains unclear. No family @  bedside  Plan: Cont ARDS protocol D/C NMB protocol Cont sedation protocol - RASS goal -3 F/U outstanding micro - all negative to date Abx as per dashboard above Cont empiric steroids started 12/09 Cont SSI while on steroids Monitor CBC, chemistries as indicated Cont NE to maintain MAP > 65 mmHg Follow CXR    35 mins CCM time    ADD 13:00: Influenza A positive on resp cx from BAL - oseltamivir restarted   [name], MD ; [facility] service Mobile [phone].  After 5:30 PM or weekends,  call [phone]

## 2012-03-31 ENCOUNTER — Inpatient Hospital Stay (HOSPITAL_COMMUNITY): Payer: MEDICAID

## 2012-03-31 DIAGNOSIS — J111 Influenza due to unidentified influenza virus with other respiratory manifestations: Secondary | ICD-10-CM

## 2012-03-31 DIAGNOSIS — J101 Influenza due to other identified influenza virus with other respiratory manifestations: Secondary | ICD-10-CM | POA: Diagnosis present

## 2012-03-31 LAB — COMPREHENSIVE METABOLIC PANEL
ALT: 25 U/L (ref 0–35)
AST: 21 U/L (ref 0–37)
Albumin: 2 g/dL — ABNORMAL LOW (ref 3.5–5.2)
CO2: 26 mEq/L (ref 19–32)
Calcium: 8.4 mg/dL (ref 8.4–10.5)
Creatinine, Ser: 2.2 mg/dL — ABNORMAL HIGH (ref 0.50–1.10)
Sodium: 136 mEq/L (ref 135–145)

## 2012-03-31 LAB — CULTURE, BLOOD (ROUTINE X 2): Culture: NO GROWTH

## 2012-03-31 LAB — CULTURE, BAL-QUANTITATIVE W GRAM STAIN
Colony Count: NO GROWTH
Culture: NO GROWTH

## 2012-03-31 LAB — CULTURE, RESPIRATORY W GRAM STAIN

## 2012-03-31 LAB — VANCOMYCIN, TROUGH: Vancomycin Tr: 27.7 ug/mL (ref 10.0–20.0)

## 2012-03-31 LAB — CBC
HCT: 23.9 % — ABNORMAL LOW (ref 36.0–46.0)
Hemoglobin: 7.5 g/dL — ABNORMAL LOW (ref 12.0–15.0)
MCH: 20.3 pg — ABNORMAL LOW (ref 26.0–34.0)
MCHC: 31.4 g/dL (ref 30.0–36.0)
MCV: 64.8 fL — ABNORMAL LOW (ref 78.0–100.0)
RBC: 3.69 MIL/uL — ABNORMAL LOW (ref 3.87–5.11)

## 2012-03-31 LAB — GLUCOSE, CAPILLARY
Glucose-Capillary: 146 mg/dL — ABNORMAL HIGH (ref 70–99)
Glucose-Capillary: 152 mg/dL — ABNORMAL HIGH (ref 70–99)
Glucose-Capillary: 182 mg/dL — ABNORMAL HIGH (ref 70–99)

## 2012-03-31 MED ORDER — BIOTENE DRY MOUTH MT LIQD
15.0000 mL | Freq: Four times a day (QID) | OROMUCOSAL | Status: DC
  Administered 2012-04-01 – 2012-04-13 (×49): 15 mL via OROMUCOSAL

## 2012-03-31 MED ORDER — BIOTENE DRY MOUTH MT LIQD: 15.0000 mL | Freq: Four times a day (QID) | OROMUCOSAL | Status: DC

## 2012-03-31 MED ORDER — HEPARIN SODIUM (PORCINE) 5000 UNIT/ML IJ SOLN
5000.0000 [IU] | Freq: Three times a day (TID) | INTRAMUSCULAR | Status: DC
  Administered 2012-03-31 – 2012-04-01 (×3): 5000 [IU] via SUBCUTANEOUS
  Filled 2012-03-31 (×6): qty 1

## 2012-03-31 MED ORDER — VITAL AF 1.2 CAL PO LIQD
1000.0000 mL | ORAL | Status: DC
  Administered 2012-03-31: 1000 mL
  Filled 2012-03-31 (×2): qty 1000

## 2012-03-31 MED ORDER — METHYLPREDNISOLONE SODIUM SUCC 40 MG IJ SOLR
40.0000 mg | Freq: Two times a day (BID) | INTRAMUSCULAR | Status: DC
  Administered 2012-03-31 – 2012-04-01 (×2): 40 mg via INTRAVENOUS
  Filled 2012-03-31 (×4): qty 1

## 2012-03-31 NOTE — Progress Notes (Signed)
CRITICAL VALUE ALERT  Critical value received:  Vanc trough level 27.7  Date of notification:  03/31/12  Time of notification:  0225  Critical value read back:yes  Nurse who received alert:  M. Renae Gloss, RN  MD notified (1st page):  Ramaswamy  Time of first page:  0228  MD notified (2nd page):  Time of second page:  Responding MD:  Marchelle Gearing  Time MD responded:  [phone]

## 2012-03-31 NOTE — Progress Notes (Signed)
PULMONARY  / CRITICAL CARE MEDICINE  Name: Megan Espinoza MRN: [id number] DOB: 12/23/1955    LOS: 7  REFERRING PROVIDER:  Dr Ardyth Harps  CHIEF COMPLAINT:  Dyspnea, cough  BRIEF PATIENT DESCRIPTION:  Admitted 12/05 by [facility] with acute febrile illness with dyspnea, NP cough. Transferred to ICU 12/08 for worsening hypoxemia. Supported with BiPAP but became unable to tolerate. Intubated 12/09 for rapid desaturation on NRB mask  EVENTS/STUDIES: 12/06 CT chest: Patchy diffuse bilateral pulmonary air space disease with air bronchograms. This is suspicious for an infectious or inflammatory process 12/09:  Intubated, ARDS protocol, NMB protocol, steroids initiated 12/09 FOB: minimal secretions, normal airways, specimens sent 12/10: 1 unit PRBCs for Hgb 6.6.  Worsening renal function. Vasopressors initiated for MAP < 60 mmHg 12/11:  NMBs stopped. ANA negative 12/12: CAM-ICU negative on WUA. Weaning FiO2/PEEP  LINES / TUBES: ETT 12/09 >>  R [place] CVL 12/09 >>   CULTURES: MRSA screen 12/5 >> NEG Blood 12/5 >> NEG Urine (clean catch) 12/5 >> diphtheroids (deemed contaminant) Legionella Ag 12/09 >> NEG Strep Ag 12/09 >> NEG Pneumocystis DFA 12/09 >> NEG AFB smear 12/09 >> NEG Fungal smear/cx 12/09 >> NEG Legionella cx 12/09 >> NEG resp virus panel 12/09 >> Influenza A POSITIVE Resp 12/09 >> NEG    ANTIBIOTICS: Aztreonam 12/5 >> 12/11 oseltamivir 12/08 >> 12/10 TMP/SMX 12/09 >> 12/10 Levofloxacin 12/5 >> 12/12 vanco 12/5 >> 12/12 oseltamivir 12/11 >>    LEVEL OF CARE:  ICU PRIMARY SERVICE:  PCCM CONSULTANTS: none CODE STATUS:  Full  DIET:  Advancing TFs DVT Px:  Combined SCDs, SQ hep GI Px:  PPI   INTERVAL HISTORY:  RASS -2, + F/C. Pulls large Vt with Pi of 15 cm H2O.    VITAL SIGNS: Temp:  [96.6 F (35.9 C)-98.8 F (37.1 C)] 97.6 F (36.4 C) (12/12 0800) Pulse Rate:  [82-106] 106  (12/12 1015) Resp:  [0-28] 14  (12/12 1015) BP: (109-132)/(42-60) 129/55 mmHg  (12/12 1015) SpO2:  [87 %-98 %] 97 % (12/12 1015) FiO2 (%):  [60 %-70 %] 70 % (12/12 1000)  Intake/Output Summary (Last 24 hours) at 03/31/12 1050 Last data filed at 03/31/12 1016  Gross per 24 hour  Intake 1346.19 ml  Output   1335 ml  Net  11.19 ml    PHYSICAL EXAMINATION: General:  Sedated, paralyzed, intubated Neuro: MAEs HEENT:  WNL Neck:  No JVD Cardiovascular:  RRR, +SEM Lungs:  B rales, no wheeze Abdomen:  Obese, non-tender, + BS Musculoskeletal:  Trace edema, warm, 2+ DP pulses Skin:  No rashes   Lab 03/31/12 0413 03/30/12 0430 03/29/12 1517  NA 136 137 133*  K 3.6 3.4* 3.0*  CL 99 100 93*  CO2 26 27 28   BUN 48* 32* 27*  CREATININE 2.20* 1.87* 1.84*  GLUCOSE 151* 148* 169*    Lab 03/31/12 0413 03/30/12 0430 03/29/12 1517  HGB 7.5* 8.0* 8.1*  HCT 23.9* 25.6* 25.9*  WBC 13.6* 14.1* 9.2  PLT 233 209 174   CXR: NSC ARDS pattern   Principal Problem:  *Acute respiratory failure with hypoxia  Influenza A PNA  ARDS (adult respiratory distress syndrome)  Microcytic anemia  1 u PRBCs 12/10  No indication for PRBCs 12/12  Steroid-induced hyperglycemia  Controlled on SSI  Obesity (BMI 30.0-34.9)  Hypotension/shock  Weaning NE for MAP > 65 mmHg  Acute renal insuff due to hypotension/hypoperfusion  Nonoliguric, Cr still rising  No indication for RRT   DISC: Her gas exchange and  CXR are out o proportion to lung mechanics. On PCV with Pi of 15 cm H2O she pulls Vt of 700 cc. More comfortable in PCV mode  Plan: Cont PCV, vent changes made Cont sedation protocol - RASS goal -2 Abx as per dashboard above - D/C Vanc, levoflox, cont oseltamivir Cont empiric steroids started 12/09, decrease dose 12/12 Cont SSI while on steroids Advance TFs Monitor CBC, chemistries as indicated Cont NE to maintain MAP > 65 mmHg Follow CXR   Husband updated over phone  35 mins CCM time   [name], MD ; [facility] service Mobile [phone].  After 5:30 PM or  weekends, call [phone]

## 2012-03-31 NOTE — Progress Notes (Signed)
UR completed 

## 2012-03-31 NOTE — Progress Notes (Signed)
RT increased Fi02 to 70% per sat of 91%.

## 2012-03-31 NOTE — Progress Notes (Signed)
Nutrition Follow-up  Intervention:   Recommend Oxepa @ 15 ml/hr with 60 ml Prostat QID.   Assessment:   Patient is currently intubated on ventilator support. Pt positive Influenza A. Pt on ARDS protocol but is now off of paralytics. Per RN weaning pressors.  Pt discussed during ICU rounds and with RN.  MV: 11.3 Temp:Temp (24hrs), Avg:97.8 F (36.6 C), Min:96.6 F (35.9 C), Max:98.8 F (37.1 C)  Patient has OGT in place. Vital AF 1.2 (ordered by [facility] on 12/11) is infusing @ 40 ml/hr. Tube feeding regimen currently providing 1152 kcal (57% of kcal needs), 72 grams protein (50% of protein needs), and 778 ml H2O.   Free water flushes: NA  Total free water: 778 ml per day.  Residuals: 5-125 ml  Last bm: 12/7  Diet Order:  NPO  Meds: Scheduled Meds:   . ipratropium  0.5 mg Nebulization Q6H   And  . albuterol  2.5 mg Nebulization Q6H  . chlorhexidine  15 mL Mouth Rinse BID  . feeding supplement (VITAL AF 1.2 CAL)  1,000 mL Per Tube Q24H  . heparin subcutaneous  5,000 Units Subcutaneous Q8H  . insulin aspart  0-20 Units Subcutaneous Q4H  . methylPREDNISolone (SOLU-MEDROL) injection  40 mg Intravenous Q12H  . oseltamivir  75 mg Per Tube BID  . pantoprazole (PROTONIX) IV  40 mg Intravenous QHS  . [COMPLETED] potassium chloride  40 mEq Per Tube Once  . [COMPLETED] potassium chloride      . sodium chloride  3 mL Intravenous Q12H  . [DISCONTINUED] antiseptic oral rinse  15 mL Mouth Rinse QID  . [DISCONTINUED] aztreonam  1 g Intravenous Q8H  . [DISCONTINUED] levofloxacin (LEVAQUIN) IV  750 mg Intravenous Q48H  . [DISCONTINUED] methylPREDNISolone (SOLU-MEDROL) injection  80 mg Intravenous Q8H  . [DISCONTINUED] vancomycin  1,750 mg Intravenous Q24H   Continuous Infusions:   . fentaNYL infusion INTRAVENOUS 100 mcg/hr (03/31/12 1100)  . midazolam (VERSED) infusion 2 mg/hr (03/31/12 1130)  . norepinephrine (LEVOPHED) Adult infusion 4 mcg/min (03/31/12 1100)  . [DISCONTINUED]  feeding supplement (VITAL AF 1.2 CAL) 1,000 mL (03/30/12 1753)   PRN Meds:.acetaminophen, fentaNYL, metoprolol, midazolam, ondansetron (ZOFRAN) IV, sodium chloride   CMP     Component Value Date/Time   NA 136 03/31/2012 0413   K 3.6 03/31/2012 0413   CL 99 03/31/2012 0413   CO2 26 03/31/2012 0413   GLUCOSE 151* 03/31/2012 0413   BUN 48* 03/31/2012 0413   CREATININE 2.20* 03/31/2012 0413   CALCIUM 8.4 03/31/2012 0413   PROT 6.0 03/31/2012 0413   ALBUMIN 2.0* 03/31/2012 0413   AST 21 03/31/2012 0413   ALT 25 03/31/2012 0413   ALKPHOS 72 03/31/2012 0413   BILITOT 0.2* 03/31/2012 0413   GFRNONAA 24* 03/31/2012 0413   GFRAA 28* 03/31/2012 0413    CBG (last 3)   Basename 03/31/12 0804 03/31/12 0412 03/30/12 2319  GLUCAP 152* 146* 134*     Intake/Output Summary (Last 24 hours) at 03/31/12 1151 Last data filed at 03/31/12 1130  Gross per 24 hour  Intake 1365.92 ml  Output   1335 ml  Net  30.92 ml    Weight Status:  270 lb on admission 291 lb 12/10  Re-estimated needs:  1998 kcal, >/= 142 grams protein   Nutrition Dx:  Inadequate oral intake related to inability to eat as evidenced by NPO status; ongoing.  Goal:  Enteral nutrition to provide 60-70% of estimated calorie needs (22-25 kcals/kg ideal body weight) and >/= 90%  of estimated protein needs, based on ASPEN guidelines for permissive underfeeding in critically ill obese individuals; not met.   Monitor:  TF tolerance, plan of care, weight   Kendell Bane RD, LDN, CNSC [phone] Pager [phone] After Hours Pager

## 2012-03-31 NOTE — Progress Notes (Addendum)
ANTIBIOTIC CONSULT NOTE - Follow Up   Pharmacy Consult:  Vancomycin  Indication:  PNA  Allergies  Allergen Reactions  . Penicillins Swelling  . Codeine Nausea And Vomiting    Patient Measurements: Height: 5\' 5"  (165.1 cm) Weight: 291 lb 7.2 oz (132.2 kg) IBW/kg (Calculated) : 57   Vital Signs: Temp: 97.5 F (36.4 C) (12/11 2332) Temp src: Axillary (12/11 2332) BP: 125/50 mmHg (12/12 0200) Pulse Rate: 98  (12/12 0200) Intake/Output from previous day: 12/11 0701 - 12/12 0700 In: 1053.3 [I.V.:723.3; NG/GT:180; IV Piggyback:150] Out: 1035 [Urine:830; Emesis/NG output:205]  Labs:  [facility] 03/30/12 0430 03/29/12 1517 03/29/12 0930  WBC 14.1* 9.2 6.3  HGB 8.0* 8.1* 7.4*  PLT 209 174 140*  LABCREA -- -- --  CREATININE 1.87* 1.84* 1.74*   Estimated Creatinine Clearance: 46.2 ml/min (by C-G formula based on Cr of 1.87).  Basename 03/31/12 0147  VANCOTROUGH 27.7*  VANCOPEAK --  Drue Dun --  GENTTROUGH --  GENTPEAK --  GENTRANDOM --  TOBRATROUGH --  Nolen Mu --  TOBRARND --  AMIKACINPEAK --  AMIKACINTROU --  AMIKACIN --     Microbiology: Recent Results (from the past 720 hour(s))  URINE CULTURE     Status: Normal   Collection Time   03/24/12  8:49 PM      Component Value Range Status Comment   Specimen Description URINE, CLEAN CATCH   Final    Special Requests NONE CX ADDED AT 2124 ON [id number]   Final    Culture  Setup Time 03/24/2012 22:54   Final    Colony Count >=100,000 COLONIES/ML   Final    Culture     Final    Value: DIPHTHEROIDS(CORYNEBACTERIUM SPECIES)     Note: Standardized susceptibility testing for this organism is not available.   Report Status 03/26/2012 FINAL   Final   CULTURE, BLOOD (ROUTINE X 2)     Status: Normal (Preliminary result)   Collection Time   03/24/12 10:45 PM      Component Value Range Status Comment   Specimen Description BLOOD RIGHT HAND   Final    Special Requests BOTTLES DRAWN AEROBIC AND ANAEROBIC 10CC   Final    Culture   Setup Time 03/25/2012 03:42   Final    Culture     Final    Value:        BLOOD CULTURE RECEIVED NO GROWTH TO DATE CULTURE WILL BE HELD FOR 5 DAYS BEFORE ISSUING A FINAL NEGATIVE REPORT   Report Status PENDING   Incomplete   CULTURE, BLOOD (ROUTINE X 2)     Status: Normal (Preliminary result)   Collection Time   03/24/12 10:50 PM      Component Value Range Status Comment   Specimen Description BLOOD LEFT HAND   Final    Special Requests BOTTLES DRAWN AEROBIC AND ANAEROBIC   Final    Culture  Setup Time 03/25/2012 03:42   Final    Culture     Final    Value:        BLOOD CULTURE RECEIVED NO GROWTH TO DATE CULTURE WILL BE HELD FOR 5 DAYS BEFORE ISSUING A FINAL NEGATIVE REPORT   Report Status PENDING   Incomplete   MRSA PCR SCREENING     Status: Normal   Collection Time   03/26/12 10:00 PM      Component Value Range Status Comment   MRSA by PCR NEGATIVE  NEGATIVE Final   CULTURE, RESPIRATORY     Status:  Normal (Preliminary result)   Collection Time   03/28/12 11:48 AM      Component Value Range Status Comment   Specimen Description BRONCHIAL WASHINGS   Final    Special Requests NONE   Final    Gram Stain     Final    Value: NO WBC SEEN     NO SQUAMOUS EPITHELIAL CELLS SEEN     NO ORGANISMS SEEN   Culture NO GROWTH 1 DAY   Final    Report Status PENDING   Incomplete   CULTURE, BAL-QUANTITATIVE     Status: Normal (Preliminary result)   Collection Time   03/28/12 11:49 AM      Component Value Range Status Comment   Specimen Description BRONCHIAL WASHINGS   Final    Special Requests NONE   Final    Gram Stain     Final    Value: RARE WBC PRESENT, PREDOMINANTLY PMN     RARE SQUAMOUS EPITHELIAL CELLS PRESENT     NO ORGANISMS SEEN   Colony Count PENDING   Incomplete    Culture NO GROWTH 1 DAY   Final    Report Status PENDING   Incomplete   AFB CULTURE WITH SMEAR     Status: Normal (Preliminary result)   Collection Time   03/28/12 11:50 AM      Component Value Range Status  Comment   Specimen Description BRONCHIAL WASHINGS   Final    Special Requests NONE   Final    ACID FAST SMEAR NO ACID FAST BACILLI SEEN   Final    Culture     Final    Value: CULTURE WILL BE EXAMINED FOR 6 WEEKS BEFORE ISSUING A FINAL REPORT   Report Status PENDING   Incomplete   FUNGUS CULTURE W SMEAR     Status: Normal (Preliminary result)   Collection Time   03/28/12 11:50 AM      Component Value Range Status Comment   Specimen Description BRONCHIAL WASHINGS   Final    Special Requests NONE   Final    Fungal Smear     Final    Value: NO YEAST OR FUNGAL ELEMENTS SEEN PREVIOUSLY REPORTED AS NOT DONE CORRECTED RESULTS CALLED TO: JESSICA DAVIS 10:50 03/29/12 FUENG   Culture CULTURE IN PROGRESS FOR FOUR WEEKS   Final    Report Status PENDING   Incomplete   LEGIONELLA CULTURE     Status: Normal (Preliminary result)   Collection Time   03/28/12 11:50 AM      Component Value Range Status Comment   Specimen Description BRONCHIAL WASHINGS   Final    Special Requests NONE   Final    Culture     Final    Value: NO LEGIONELLA ISOLATED, CULTURE IN PROGRESS FOR 5 DAYS   Report Status PENDING   Incomplete   PNEUMOCYSTIS JIROVECI SMEAR BY DFA     Status: Normal   Collection Time   03/28/12 11:50 AM      Component Value Range Status Comment   Specimen Source-[facility] BRONCHIAL WASHINGS   Final    Pneumocystis jiroveci Ag NEGATIVE   Final Performed at [facility]  RESPIRATORY VIRUS PANEL     Status: Abnormal   Collection Time   03/28/12 11:50 AM      Component Value Range Status Comment   Source - RVPAN     Corrected CORRECTED ON 12/11 AT 1112: PREVIOUSLY REPORTED AS BRONCHIAL WASHINGS CORRECTED ON 12/09 AT  1159: PREVIOUSLY REPORTED AS BRONCHIAL ALVEOLAR LAVAGE   Value: CORRECTED ON 12/09 AT 1159: PREVIOUSLY REPORTED AS BRONCHIAL ALVEOLAR LAVAGE   Respiratory Syncytial Virus A NOT DETECTED   Final    Respiratory Syncytial Virus B NOT DETECTED   Final    Influenza A DETECTED (*)   Final    Influenza B NOT DETECTED   Final    Parainfluenza 1 NOT DETECTED   Final    Parainfluenza 2 NOT DETECTED   Final    Parainfluenza 3 NOT DETECTED   Final    Metapneumovirus NOT DETECTED   Final    Rhinovirus NOT DETECTED   Final    Adenovirus NOT DETECTED   Final    Influenza A H1 NOT DETECTED   Final    Influenza A H3 NOT DETECTED   Final     Medical History: Past Medical History  Diagnosis Date  . Obesity (BMI 30.0-34.9)    Assessment: 56 YOF on levaquin, vancomycin and azactam for PNA.  Patient now with acute renal insuff, Scr up to 1.74 from 0.74 on 12/7.    Vancomycin trough (27.7 mcg/mL) is above-goal.    Goal of Therapy:  Vancomycin trough level 15-20 mcg/ml  Plan:  1. Discontinue scheduled vancomycin doses.  2. Follow-up renal function / SrCr.  3. Vancomycin random level at 18:00 today.   Lorre Munroe, PharmD 03/31/12, 02:55 AM

## 2012-04-01 ENCOUNTER — Inpatient Hospital Stay (HOSPITAL_COMMUNITY): Payer: MEDICAID

## 2012-04-01 LAB — CBC
HCT: 22.7 % — ABNORMAL LOW (ref 36.0–46.0)
MCHC: 31.3 g/dL (ref 30.0–36.0)
MCV: 65.2 fL — ABNORMAL LOW (ref 78.0–100.0)
RDW: 24.6 % — ABNORMAL HIGH (ref 11.5–15.5)

## 2012-04-01 LAB — BASIC METABOLIC PANEL
BUN: 58 mg/dL — ABNORMAL HIGH (ref 6–23)
CO2: 28 mEq/L (ref 19–32)
Chloride: 104 mEq/L (ref 96–112)
Creatinine, Ser: 1.92 mg/dL — ABNORMAL HIGH (ref 0.50–1.10)
GFR calc Af Amer: 33 mL/min — ABNORMAL LOW (ref >90)

## 2012-04-01 LAB — GLUCOSE, CAPILLARY
Glucose-Capillary: 154 mg/dL — ABNORMAL HIGH (ref 70–99)
Glucose-Capillary: 144 mg/dL — ABNORMAL HIGH (ref 70–99)
Glucose-Capillary: 146 mg/dL — ABNORMAL HIGH (ref 70–99)

## 2012-04-01 MED ORDER — OXEPA PO LIQD
1000.0000 mL | ORAL | Status: DC
  Administered 2012-04-01 – 2012-04-02 (×2): 1000 mL
  Filled 2012-04-01 (×3): qty 1000

## 2012-04-01 MED ORDER — BISACODYL 10 MG RE SUPP
10.0000 mg | Freq: Every day | RECTAL | Status: DC | PRN
  Administered 2012-04-01: 10 mg via RECTAL
  Filled 2012-04-01: qty 1

## 2012-04-01 MED ORDER — FREE WATER
100.0000 mL | Freq: Three times a day (TID) | Status: DC
  Administered 2012-04-01 – 2012-04-02 (×3): 100 mL

## 2012-04-01 MED ORDER — PRO-STAT SUGAR FREE PO LIQD
60.0000 mL | Freq: Four times a day (QID) | ORAL | Status: DC
  Administered 2012-04-01 – 2012-04-04 (×11): 60 mL
  Filled 2012-04-01 (×16): qty 60

## 2012-04-01 MED ORDER — DOCUSATE SODIUM 50 MG/5ML PO LIQD
100.0000 mg | Freq: Two times a day (BID) | ORAL | Status: DC
  Administered 2012-04-01: 100 mg
  Filled 2012-04-01 (×4): qty 10

## 2012-04-01 MED ORDER — PANTOPRAZOLE SODIUM 40 MG PO PACK
40.0000 mg | PACK | Freq: Every day | ORAL | Status: DC
  Administered 2012-04-01: 40 mg
  Filled 2012-04-01 (×2): qty 20

## 2012-04-01 MED ORDER — FERROUS SULFATE 300 (60 FE) MG/5ML PO SYRP
300.0000 mg | ORAL_SOLUTION | Freq: Two times a day (BID) | ORAL | Status: DC
  Administered 2012-04-01 – 2012-04-09 (×15): 300 mg
  Filled 2012-04-01 (×24): qty 5

## 2012-04-01 MED ORDER — PREDNISONE 20 MG PO TABS
20.0000 mg | ORAL_TABLET | Freq: Two times a day (BID) | ORAL | Status: DC
  Administered 2012-04-01 – 2012-04-02 (×2): 20 mg via ORAL
  Filled 2012-04-01 (×4): qty 1

## 2012-04-01 NOTE — Significant Event (Signed)
Pt noted to have external hemorrhoid at time of insertion of flexiseal.  On camera review, hemorrhoid not large, and no difficulty noted inserting flexiseal.  Due to recent Abx use and diarrhea, will check stool PCR for c diff.  Coralyn Helling, MD 04/01/2012, 7:33 PM [phone]

## 2012-04-01 NOTE — Significant Event (Signed)
Pt with frequent loose BM's.  Will have rectal tube placed.  Coralyn Helling, MD 04/01/2012, 6:23 PM [phone]

## 2012-04-01 NOTE — Progress Notes (Signed)
PULMONARY  / CRITICAL CARE MEDICINE  Name: Megan Espinoza MRN: [id number] DOB: 08-26-1955    LOS: 8  REFERRING PROVIDER:  Dr Ardyth Harps  CHIEF COMPLAINT:  Dyspnea, cough  BRIEF PATIENT DESCRIPTION:  Admitted 12/05 by [facility] with acute febrile illness with dyspnea, NP cough. Transferred to ICU 12/08 for worsening hypoxemia. Supported with BiPAP but became unable to tolerate. Intubated 12/09 for rapid desaturation on NRB mask  EVENTS/STUDIES: 12/06 CT chest: Patchy diffuse bilateral pulmonary air space disease with air bronchograms. This is suspicious for an infectious or inflammatory process 12/09:  Intubated, ARDS protocol, NMB protocol, steroids initiated 12/09 FOB: minimal secretions, normal airways, specimens sent 12/10: 1 unit PRBCs for Hgb 6.6.  Worsening renal function. Vasopressors initiated for MAP < 60 mmHg 12/11:  NMBs stopped. ANA negative 12/12: CAM-ICU negative on WUA. Weaning FiO2/PEEP  LINES / TUBES: ETT 12/09 >>  R [place] CVL 12/09 >>   CULTURES: MRSA screen 12/5 >> NEG Blood 12/5 >> NEG Urine (clean catch) 12/5 >> diphtheroids (deemed contaminant) Legionella Ag 12/09 >> NEG Strep Ag 12/09 >> NEG Pneumocystis DFA 12/09 >> NEG AFB smear 12/09 >> NEG Fungal smear/cx 12/09 >> NEG Legionella cx 12/09 >> NEG resp virus panel 12/09 >> Influenza A POSITIVE Resp 12/09 >> NEG    ANTIBIOTICS: Aztreonam 12/5 >> 12/11 oseltamivir 12/08 >> 12/10 TMP/SMX 12/09 >> 12/10 Levofloxacin 12/5 >> 12/12 vanco 12/5 >> 12/12 oseltamivir 12/11 >>    LEVEL OF CARE:  ICU PRIMARY SERVICE:  PCCM CONSULTANTS: none CODE STATUS:  Full  DIET:  Advancing TFs DVT Px: SCDs GI Px:  PPI   INTERVAL HISTORY:  RASS -2, + F/C. NAD   VITAL SIGNS: Temp:  [97.6 F (36.4 C)-98.3 F (36.8 C)] 97.6 F (36.4 C) (12/13 0758) Pulse Rate:  [99-118] 112  (12/13 1200) Resp:  [15-32] 23  (12/13 1200) BP: (116-151)/(45-65) 131/55 mmHg (12/13 1200) SpO2:  [90 %-98 %] 92 % (12/13 1200) FiO2  (%):  [50 %-70 %] 50 % (12/13 1200) Weight:  [132.6 kg (292 lb 5.3 oz)] 132.6 kg (292 lb 5.3 oz) (12/13 0400)  Intake/Output Summary (Last 24 hours) at 04/01/12 1207 Last data filed at 04/01/12 1100  Gross per 24 hour  Intake 1354.01 ml  Output   1460 ml  Net -105.99 ml    PHYSICAL EXAMINATION: General:  RASS -2 Neuro: MAEs, + F/C HEENT:  WNL Neck:  No JVD Cardiovascular:  RRR, +SEM Lungs:  B rales, no wheeze Abdomen:  Obese, non-tender, + BS Musculoskeletal:  Trace edema, warm, 2+ DP pulses Skin:  No rashes   Lab 04/01/12 0545 03/31/12 0413 03/30/12 0430  NA 140 136 137  K 4.6 3.6 3.4*  CL 104 99 100  CO2 28 26 27   BUN 58* 48* 32*  CREATININE 1.92* 2.20* 1.87*  GLUCOSE 137* 151* 148*    Lab 04/01/12 0545 03/31/12 0413 03/30/12 0430  HGB 7.1* 7.5* 8.0*  HCT 22.7* 23.9* 25.6*  WBC 8.1 13.6* 14.1*  PLT 225 233 209   CXR: NSC ARDS pattern   Principal Problem:  *Acute respiratory failure with hypoxia  Influenza A PNA  ARDS (adult respiratory distress syndrome)  Microcytic anemia  1 u PRBCs 12/10  No indication for PRBCs 12/13  FeSO4 initiated 12/13  Avoid anticoagulation, SCDs for DVTp  Steroid-induced hyperglycemia  Controlled on SSI  Obesity (BMI 30.0-34.9)  Hypotension/shock  Off pressors 12/13  Acute renal insuff due to hypotension/hypoperfusion  Nonoliguric, Cr still rising  No indication  for RRT   DISC: Her gas exchange and CXR are out o proportion to lung mechanics. On PCV with Pi of 15 cm H2O she pulls Vt of 700 cc. More comfortable in PCV mode  Plan: Cont PCV, wean FiO2 and PEEP as tolerated Cont sedation protocol - RASS goal -2 Abx as per dashboard above - cont oseltamivir for 10 days total Cont empiric steroids started 12/09, decrease dose 12/12, change to prednisone taper 12/13 Cont SSI while on steroids Change TFs to Oxepa and advance to goal per nutrition recs Monitor CBC, chemistries as indicated  Transfuse for Hgb < 7.0 gm/dL  SQ  hep D/C'd due to microcytic anemia Add FeSO4 12/13 Follow CXR    35 mins CCM time   [name], MD ; [facility] service Mobile [phone].  After 5:30 PM or weekends, call [phone]

## 2012-04-01 NOTE — Progress Notes (Signed)
Pt given Dulcolax PR prn at 14:33 d/t no BM in last few days. Pt had 1st BM at 16:30 followed by multiple copious, watery brown stools during the 18:00 hour.  MD made aware. Flexi seal and c-diff pcr ordered. Thorough foley and peri care completed during this time; stool noted around foley site. Foley secured with leg strap. Prior to placing flexi seal, pt noted to have moderate-large external non-bleeding hemorrhoid and redness surrounding rectum. Elink MD aware and visualized area. Flexi seal placed. Pt also noted to have redness in vaginal area. Enteric isolation ordered per protocol in addition to current droplet isolation.   Holly Bodily

## 2012-04-02 ENCOUNTER — Inpatient Hospital Stay (HOSPITAL_COMMUNITY): Payer: MEDICAID

## 2012-04-02 LAB — BASIC METABOLIC PANEL
CO2: 27 mEq/L (ref 19–32)
Calcium: 9 mg/dL (ref 8.4–10.5)
Creatinine, Ser: 1.57 mg/dL — ABNORMAL HIGH (ref 0.50–1.10)

## 2012-04-02 LAB — BLOOD GAS, ARTERIAL
Acid-Base Excess: 3.6 mmol/L — ABNORMAL HIGH (ref 0.0–2.0)
Bicarbonate: 28.1 mEq/L — ABNORMAL HIGH (ref 20.0–24.0)
FIO2: 0.6 %
O2 Saturation: 96.1 %
pO2, Arterial: 87.4 mmHg (ref 80.0–100.0)

## 2012-04-02 LAB — GLUCOSE, CAPILLARY
Glucose-Capillary: 100 mg/dL — ABNORMAL HIGH (ref 70–99)
Glucose-Capillary: 102 mg/dL — ABNORMAL HIGH (ref 70–99)

## 2012-04-02 LAB — CBC
MCH: 20.1 pg — ABNORMAL LOW (ref 26.0–34.0)
MCV: 65.5 fL — ABNORMAL LOW (ref 78.0–100.0)
Platelets: 306 10*3/uL (ref 150–400)
RDW: 25.2 % — ABNORMAL HIGH (ref 11.5–15.5)
WBC: 11.2 10*3/uL — ABNORMAL HIGH (ref 4.0–10.5)

## 2012-04-02 LAB — TYPE AND SCREEN
ABO/RH(D): O POS
Unit division: 0

## 2012-04-02 LAB — LEGIONELLA CULTURE

## 2012-04-02 MED ORDER — IPRATROPIUM BROMIDE 0.02 % IN SOLN: 0.5000 mg | RESPIRATORY_TRACT | Status: DC | PRN

## 2012-04-02 MED ORDER — CHLORHEXIDINE GLUCONATE 0.12 % MT SOLN
15.0000 mL | Freq: Two times a day (BID) | OROMUCOSAL | Status: DC
  Administered 2012-04-02 – 2012-04-14 (×22): 15 mL via OROMUCOSAL
  Filled 2012-04-02 (×25): qty 15

## 2012-04-02 MED ORDER — BIOTENE DRY MOUTH MT LIQD: 15.0000 mL | Freq: Four times a day (QID) | OROMUCOSAL | Status: DC

## 2012-04-02 MED ORDER — FUROSEMIDE 10 MG/ML IJ SOLN
40.0000 mg | Freq: Four times a day (QID) | INTRAMUSCULAR | Status: AC
  Administered 2012-04-02 (×2): 40 mg via INTRAVENOUS
  Filled 2012-04-02 (×2): qty 4

## 2012-04-02 MED ORDER — ALBUTEROL SULFATE (5 MG/ML) 0.5% IN NEBU
2.5000 mg | INHALATION_SOLUTION | RESPIRATORY_TRACT | Status: DC | PRN
  Administered 2012-04-07: 2.5 mg via RESPIRATORY_TRACT
  Filled 2012-04-02: qty 0.5

## 2012-04-02 MED ORDER — HEPARIN SODIUM (PORCINE) 5000 UNIT/ML IJ SOLN
5000.0000 [IU] | Freq: Three times a day (TID) | INTRAMUSCULAR | Status: DC
  Administered 2012-04-02 – 2012-04-03 (×6): 5000 [IU] via SUBCUTANEOUS
  Filled 2012-04-02 (×10): qty 1

## 2012-04-02 MED ORDER — CLONAZEPAM 1 MG PO TABS
1.0000 mg | ORAL_TABLET | Freq: Two times a day (BID) | ORAL | Status: DC
  Administered 2012-04-02 – 2012-04-05 (×7): 1 mg via ORAL
  Filled 2012-04-02 (×8): qty 1

## 2012-04-02 MED ORDER — CLONAZEPAM 0.1 MG/ML ORAL SUSPENSION
1.0000 mg | Freq: Two times a day (BID) | ORAL | Status: DC
  Filled 2012-04-02 (×2): qty 10

## 2012-04-02 MED ORDER — PREDNISONE 20 MG PO TABS
20.0000 mg | ORAL_TABLET | Freq: Every day | ORAL | Status: DC
  Administered 2012-04-03: 20 mg via ORAL
  Filled 2012-04-02 (×2): qty 1

## 2012-04-02 MED ORDER — FAMOTIDINE 40 MG/5ML PO SUSR
20.0000 mg | Freq: Two times a day (BID) | ORAL | Status: DC
  Administered 2012-04-02 – 2012-04-08 (×14): 20 mg
  Filled 2012-04-02 (×17): qty 2.5

## 2012-04-02 NOTE — Progress Notes (Signed)
PULMONARY  / CRITICAL CARE MEDICINE  Name: Megan Espinoza MRN: [id number] DOB: March 04, 1956    LOS: 9  REFERRING MD :  Elizabeth Palau  CHIEF COMPLAINT:  Respiratory failure  BRIEF PATIENT DESCRIPTION:  56 yo female admitted 03/24/2012 fever, dry cough, chills, nausea 2nd to PNA complicated by anemia.  Developed worsening hypoxia 12/08 from ARDS and transfer to ICU with PCCM assuming care.  On 12/09 developed VDRF.  Found to have influenza A positive.   LINES / TUBES: ETT 12/09>> Rt [place] CVL 12/09>>  CULTURES: MRSA screen 12/5 >>negative Blood 12/5 >>negative Urine (clean catch) 12/5 >> diphtheroids (deemed contaminant)  Legionella Ag 12/09 >>negative Strep Ag 12/09 >>negtive Pneumocystis DFA 12/09 >>negative AFB smear 12/09 >>negative Fungal smear/cx 12/09 >>negative Legionella cx 12/09 >>negative resp virus panel 12/09 >> Influenza A POSITIVE  Resp 12/09 >>negative  ANTIBIOTICS: Aztreonam 12/5 >> 12/11  oseltamivir 12/08 >> 12/10  TMP/SMX 12/09 >> 12/10  Levofloxacin 12/5 >> 12/12  vanco 12/5 >> 12/12  oseltamivir 12/11 >>   SIGNIFICANT EVENTS:  12/08 Transfer to ICU 12/09 VDRF, ARDS, nimbex started, steroids started 12/10 Transfuse PRBC, Pressors started 12/11 Nimbex stopped  TESTS: 12/06 CT chest >>diffuse b/l patchy ASD with air bronchograms 12/09 Bronchoscopy>>minimal secretions, normal airways, blood tinged BAL 12/09 RF, Ant-DS DNA, SCL-70 Ab, HIV negative/non-reactive  LEVEL OF CARE:  ICU PRIMARY SERVICE:  PCCM CONSULTANTS:  None CODE STATUS DIET:  Tube feeds DVT Px:  SQ heparin GI Px:  Pepcid   INTERVAL HISTORY:  Diarrhea overnight.  Needing increased oxygen, PEEP.  VITAL SIGNS: Temp:  [98.1 F (36.7 C)-98.8 F (37.1 C)] 98.7 F (37.1 C) (12/14 0800) Pulse Rate:  [101-132] 120  (12/14 0815) Resp:  [18-29] 26  (12/14 0815) BP: (123-180)/(51-125) 180/75 mmHg (12/14 0815) SpO2:  [87 %-100 %] 95 % (12/14 0815) FiO2 (%):  [50 %-70 %]  50 % (12/14 0815) HEMODYNAMICS:   VENTILATOR SETTINGS: Vent Mode:  [-] PCV FiO2 (%):  [50 %-70 %] 50 % Set Rate:  [16 bmp] 16 bmp PEEP:  [8 cmH20-10 cmH20] 10 cmH20 Plateau Pressure:  [24 cmH20-27 cmH20] 25 cmH20 INTAKE / OUTPUT: Intake/Output      12/13 0701 - 12/14 0700 12/14 0701 - 12/15 0700   I.V. (mL/kg) 462.5 (3.5) 52 (0.4)   Other 690    NG/GT 430 65   IV Piggyback     Total Intake(mL/kg) 1582.5 (11.9) 117 (0.9)   Urine (mL/kg/hr) 2205 (0.7) 250   Stool 950    Total Output 3155 250   Net -1572.5 -133        Stool Occurrence 7 x      PHYSICAL EXAMINATION: General:  Ill appearing Neuro:  Follows commands HEENT:  ETT in place Cardiovascular:  s1s2 tachycardic, regular, no murmur Lungs:  B/l rhonchi Abdomen:  Soft, non tender Musculoskeletal:  1+ edema Skin:  No rashes   LABS: Cbc  Lab 04/02/12 0502 04/01/12 0545 03/31/12 0413  WBC 11.2* -- --  HGB 7.8* 7.1* 7.5*  HCT 25.4* 22.7* 23.9*  PLT 306 225 233    Chemistry   Lab 04/02/12 0502 04/01/12 0545 03/31/12 0413  NA 144 140 136  K 4.6 4.6 3.6  CL 109 104 99  CO2 27 28 26   BUN 63* 58* 48*  CREATININE 1.57* 1.92* 2.20*  CALCIUM 9.0 8.7 8.4  MG -- -- --  PHOS -- -- --  GLUCOSE 99 137* 151*    Liver fxn  Lab 03/31/12  0413 03/29/12 0930  AST 21 42*  ALT 25 29  ALKPHOS 72 79  BILITOT 0.2* 0.2*  PROT 6.0 5.9*  ALBUMIN 2.0* 1.8*   coags No results found for this basename: APTT:3,INR:3 in the last 168 hours Sepsis markers  Lab 03/30/12 0430 03/29/12 0930 03/28/12 1400  LATICACIDVEN -- -- --  PROCALCITON 0.14 0.21 0.17   Cardiac markers No results found for this basename: CKTOTAL:3,CKMB:3,TROPONINI:3 in the last 168 hours BNP No results found for this basename: PROBNP:3 in the last 168 hours ABG  Lab 03/29/12 1902 03/28/12 2107 03/28/12 1750  PHART 7.371 7.329* 7.199*  PCO2ART 48.1* 60.5* 84.7*  PO2ART 68.2* 86.0 99.5  HCO3 27.5* 30.9* 31.8*  TCO2 29.0 32.8 34.4    CBG  trend  Lab 04/02/12 0757 04/02/12 0340 04/01/12 2330 04/01/12 2003 04/01/12 1522  GLUCAP 86 100* 125* 119* 141*    IMAGING: Dg Chest Port 1 View  04/02/2012  *RADIOLOGY REPORT*  Clinical Data: Respiratory failure.  Pulmonary infiltrates.  PORTABLE CHEST - 1 VIEW  Comparison: 04/01/2012  Findings: Endotracheal tube tip is 2 cm above the carina.  Central venous catheter tip is in the superior vena cava at the level of the azygos vein.  NG tube tip is below the diaphragm.  Pulmonary vascularity has improved since the prior study.  The patchy bilateral infiltrates are slightly improved.  IMPRESSION: Decreased pulmonary vascular prominence.  Slight improvement in the patchy bilateral infiltrates.   Original Report Authenticated By: Francene Boyers, M.D.    Dg Chest Port 1 View  04/01/2012  *RADIOLOGY REPORT*  Clinical Data: Respiratory failure  PORTABLE CHEST - 1 VIEW  Comparison: Yesterday  Findings: Endotracheal tube, NG tube, right subclavian central venous catheters stable.  Diffuse bilateral airspace disease stable.  Cardiomegaly stable.  No pneumothorax.  IMPRESSION: Stable diffuse airspace disease.   Original Report Authenticated By: Jolaine Click, M.D.      DIAGNOSES: Principal Problem:  *Acute respiratory failure with hypoxia Active Problems:  Microcytic anemia  Obesity (BMI 30.0-34.9)  ARDS (adult respiratory distress syndrome)  Steroid-induced hyperglycemia  Acute renal insufficiency  Influenza A   ASSESSMENT / PLAN:  PULMONARY  ASSESSMENT: Acute respiratory failure with ARDS 2nd to Influenza pneumonia. PLAN:   Change to [facility] with 8 cc/kg = 460 Adjust PEEP/FiO2 to keep SpO2 > 88% F/u CXR Change BD's to prn Change prednisone to 20 mg daily  CARDIOVASCULAR  ASSESSMENT:  Septic shock 2nd to PNA. Resolved. Hypertension, Sinus tachycardia. Likely related to agitation, respiratory distress. Hypervolemia. PLAN:  Lasix 40 mg q6h x 2 doses 12/14 Lopressor prn for SBP >  170, HR > 120  RENAL  ASSESSMENT:   Acute renal failure. Improving. PLAN:   Goal negative fluid balance F/u renal fx, electrolytes, urine outpt  GASTROINTESTINAL  ASSESSMENT:  Nutrition. Diarrhea. PLAN:   Continue tube feeds Continue flexiseal Change protonix to pepcid 12/14 D/c colace  HEMATOLOGIC  ASSESSMENT:   Iron deficiency anemia.  No evidence for bleeding. Ferritin 94, Iron < 10 from 12/05. PLAN:  Continue FeSO4 F/u CBC Transfuse for Hb < 7 Add SQ heparin for DVT prophylaxis 12/14  INFECTIOUS  ASSESSMENT:   Influenza A pneumonia. Diarrhea with recent Abx use > ? C diff PLAN:   Continue tamiflu F/u C diff PCR  ENDOCRINE  ASSESSMENT:   Steroid induced hyperglycemia.   PLAN:   SSI while on steroids  NEUROLOGIC  ASSESSMENT:   Agitation. At risk for critical illness polyneuropathy. Was on paralytic, and getting steroids.  PLAN:   Add low dose klonopin 12/14 Sedation protocol while on vent   Critical care time 40 minutes.  Coralyn Helling, MD [facility] Pulmonary/Critical Care 04/02/2012, 9:20 AM Pager:  [phone] After 3pm call: [phone]

## 2012-04-03 ENCOUNTER — Inpatient Hospital Stay (HOSPITAL_COMMUNITY): Payer: MEDICAID

## 2012-04-03 LAB — BASIC METABOLIC PANEL
BUN: 64 mg/dL — ABNORMAL HIGH (ref 6–23)
Calcium: 8.8 mg/dL (ref 8.4–10.5)
GFR calc non Af Amer: 35 mL/min — ABNORMAL LOW (ref >90)
Glucose, Bld: 100 mg/dL — ABNORMAL HIGH (ref 70–99)

## 2012-04-03 LAB — CBC
MCH: 20.5 pg — ABNORMAL LOW (ref 26.0–34.0)
MCHC: 30.3 g/dL (ref 30.0–36.0)
Platelets: 319 10*3/uL (ref 150–400)
RDW: 25.9 % — ABNORMAL HIGH (ref 11.5–15.5)

## 2012-04-03 LAB — MAGNESIUM: Magnesium: 2.3 mg/dL (ref 1.5–2.5)

## 2012-04-03 MED ORDER — PREDNISONE 10 MG PO TABS
10.0000 mg | ORAL_TABLET | Freq: Every day | ORAL | Status: DC
  Administered 2012-04-04: 10 mg via ORAL
  Filled 2012-04-03 (×2): qty 1

## 2012-04-03 MED ORDER — FUROSEMIDE 10 MG/ML IJ SOLN
40.0000 mg | Freq: Once | INTRAMUSCULAR | Status: AC
  Administered 2012-04-03: 40 mg via INTRAVENOUS
  Filled 2012-04-03: qty 4

## 2012-04-03 NOTE — Progress Notes (Signed)
PULMONARY  / CRITICAL CARE MEDICINE  Name: Megan Espinoza MRN: [id number] DOB: Oct 28, 1955    LOS: 10  REFERRING MD :  Elizabeth Palau  CHIEF COMPLAINT:  Respiratory failure  BRIEF PATIENT DESCRIPTION:  56 yo female admitted 03/24/2012 fever, dry cough, chills, nausea 2nd to PNA complicated by anemia.  Developed worsening hypoxia 12/08 from ARDS and transfer to ICU with PCCM assuming care.  On 12/09 developed VDRF.  Found to have influenza A positive.   LINES / TUBES: ETT 12/09>> Rt [place] CVL 12/09>>  CULTURES: MRSA screen 12/5 >>negative Blood 12/5 >>negative Urine (clean catch) 12/5 >> diphtheroids (deemed contaminant)  Legionella Ag 12/09 >>negative Strep Ag 12/09 >>negtive Pneumocystis DFA 12/09 >>negative AFB smear 12/09 >>negative Fungal smear/cx 12/09 >>negative Legionella cx 12/09 >>negative resp virus panel 12/09 >> Influenza A POSITIVE  Resp 12/09 >>negative C diff PCR 12/14 >> negative  ANTIBIOTICS: Aztreonam 12/5 >> 12/11  oseltamivir 12/08 >> 12/10  TMP/SMX 12/09 >> 12/10  Levofloxacin 12/5 >> 12/12  vanco 12/5 >> 12/12  oseltamivir 12/11 >>   SIGNIFICANT EVENTS:  12/08 Transfer to ICU 12/09 VDRF, ARDS, nimbex started, steroids started 12/10 Transfuse PRBC, Pressors started 12/11 Nimbex stopped  TESTS: 12/06 CT chest >>diffuse b/l patchy ASD with air bronchograms 12/09 Bronchoscopy>>minimal secretions, normal airways, blood tinged BAL 12/09 RF, Ant-DS DNA, SCL-70 Ab, HIV negative/non-reactive  LEVEL OF CARE:  ICU PRIMARY SERVICE:  PCCM CONSULTANTS:  None CODE STATUS DIET:  Tube feeds DVT Px:  SQ heparin GI Px:  Pepcid   INTERVAL HISTORY:  Still has increased stool outpt.  Respiratory mechanics improved.  Down to 50% FiO2.  VITAL SIGNS: Temp:  [98.6 F (37 C)-99.1 F (37.3 C)] 99 F (37.2 C) (12/15 0800) Pulse Rate:  [97-128] 121  (12/15 0821) Resp:  [0-29] 26  (12/15 0821) BP: (102-168)/(38-106) 102/58 mmHg (12/15  0821) SpO2:  [10 %-100 %] 95 % (12/15 0821) FiO2 (%):  [50 %-60 %] 50 % (12/15 0821) Weight:  [277 lb 12.5 oz (126 kg)] 277 lb 12.5 oz (126 kg) (12/15 0500) HEMODYNAMICS:   VENTILATOR SETTINGS: Vent Mode:  [-] PRVC FiO2 (%):  [50 %-60 %] 50 % Set Rate:  [26 bmp] 26 bmp Vt Set:  [460 mL] 460 mL PEEP:  [10 cmH20-12 cmH20] 10 cmH20 Pressure Support:  [10 cmH20] 10 cmH20 Plateau Pressure:  [18 cmH20-27 cmH20] 27 cmH20 INTAKE / OUTPUT: Intake/Output      12/14 0701 - 12/15 0700 12/15 0701 - 12/16 0700   I.V. (mL/kg) 576.7 (4.6) 25 (0.2)   Other 40    NG/GT 530 55   IV Piggyback 8    Total Intake(mL/kg) 1154.7 (9.2) 80 (0.6)   Urine (mL/kg/hr) 3320 (1.1) 95   Stool 200 400   Total Output 3520 495   Net -2365.3 -415          PHYSICAL EXAMINATION: General:  Ill appearing Neuro:  Follows commands HEENT:  ETT in place Cardiovascular:  s1s2 tachycardic, regular, no murmur Lungs:  B/l rhonchi Abdomen:  Soft, non tender Musculoskeletal:  1+ edema Skin:  No rashes   LABS: Cbc  Lab 04/03/12 0509 04/02/12 0502 04/01/12 0545  WBC 9.2 -- --  HGB 8.2* 7.8* 7.1*  HCT 27.1* 25.4* 22.7*  PLT 319 306 225    Chemistry   Lab 04/03/12 0509 04/02/12 0502 04/01/12 0545  NA 147* 144 140  K 4.1 4.6 4.6  CL 109 109 104  CO2 30 27 28   BUN  64* 63* 58*  CREATININE 1.59* 1.57* 1.92*  CALCIUM 8.8 9.0 8.7  MG 2.3 -- --  PHOS 4.2 -- --  GLUCOSE 100* 99 137*    Liver fxn  Lab 03/31/12 0413 03/29/12 0930  AST 21 42*  ALT 25 29  ALKPHOS 72 79  BILITOT 0.2* 0.2*  PROT 6.0 5.9*  ALBUMIN 2.0* 1.8*   coags No results found for this basename: APTT:3,INR:3 in the last 168 hours Sepsis markers  Lab 03/30/12 0430 03/29/12 0930 03/28/12 1400  LATICACIDVEN -- -- --  PROCALCITON 0.14 0.21 0.17   Cardiac markers No results found for this basename: CKTOTAL:3,CKMB:3,TROPONINI:3 in the last 168 hours BNP No results found for this basename: PROBNP:3 in the last 168 hours ABG  Lab  04/02/12 1133 03/29/12 1902 03/28/12 2107  PHART 7.394 7.371 7.329*  PCO2ART 47.2* 48.1* 60.5*  PO2ART 87.4 68.2* 86.0  HCO3 28.1* 27.5* 30.9*  TCO2 29.5 29.0 32.8    CBG trend  Lab 04/03/12 0343 04/02/12 2328 04/02/12 1946 04/02/12 1628 04/02/12 1146  GLUCAP 80 91 107* 117* 102*    IMAGING: Dg Chest Port 1 View  04/02/2012  *RADIOLOGY REPORT*  Clinical Data: Respiratory failure.  Pulmonary infiltrates.  PORTABLE CHEST - 1 VIEW  Comparison: 04/01/2012  Findings: Endotracheal tube tip is 2 cm above the carina.  Central venous catheter tip is in the superior vena cava at the level of the azygos vein.  NG tube tip is below the diaphragm.  Pulmonary vascularity has improved since the prior study.  The patchy bilateral infiltrates are slightly improved.  IMPRESSION: Decreased pulmonary vascular prominence.  Slight improvement in the patchy bilateral infiltrates.   Original Report Authenticated By: Francene Boyers, M.D.      DIAGNOSES: Principal Problem:  *Acute respiratory failure with hypoxia Active Problems:  Microcytic anemia  Obesity (BMI 30.0-34.9)  ARDS (adult respiratory distress syndrome)  Steroid-induced hyperglycemia  Acute renal insufficiency  Influenza A   ASSESSMENT / PLAN:  PULMONARY  ASSESSMENT: Acute respiratory failure with ARDS 2nd to Influenza pneumonia. PLAN:   Change to [facility] with 8 cc/kg = 460 Adjust PEEP/FiO2 to keep SpO2 > 88% F/u CXR Changed BD's to prn Change prednisone to 10 mg daily  CARDIOVASCULAR  ASSESSMENT:  Septic shock 2nd to PNA. Resolved. Hypertension, Sinus tachycardia. Likely related to agitation, respiratory distress. Hypervolemia. PLAN:  Lasix 40 mg x 1 doses 12/14 Lopressor prn for SBP > 170, HR > 120  RENAL  ASSESSMENT:   Acute renal failure. Improving. PLAN:   Goal negative fluid balance F/u renal fx, electrolytes, urine outpt  GASTROINTESTINAL  ASSESSMENT:  Nutrition. Diarrhea. PLAN:   Continue tube feeds >>  will ask nutrition to adjust tube feeds due to concern for osmotic diarrhea Continue flexiseal Changed protonix to pepcid 12/14 D/c'ed colace  HEMATOLOGIC  ASSESSMENT:   Iron deficiency anemia.  No evidence for bleeding. Ferritin 94, Iron < 10 from 12/05. PLAN:  Continue FeSO4 F/u CBC Transfuse for Hb < 7 Added SQ heparin for DVT prophylaxis 12/14  INFECTIOUS  ASSESSMENT:   Influenza A pneumonia. PLAN:   Continue tamiflu  ENDOCRINE  ASSESSMENT:   Steroid induced hyperglycemia.  Resolved with reduction in steroid dose.  PLAN:   D/c SSI  NEUROLOGIC  ASSESSMENT:   Agitation. Improved 12/15. At risk for critical illness polyneuropathy. Was on paralytic, and getting steroids. PLAN:   Added low dose klonopin 12/14 Sedation protocol while on vent   Critical care time 35 minutes.  Coralyn Helling, MD  [facility] Pulmonary/Critical Care 04/03/2012, 8:47 AM Pager:  [phone] After 3pm call: [phone]

## 2012-04-04 ENCOUNTER — Inpatient Hospital Stay (HOSPITAL_COMMUNITY): Payer: MEDICAID

## 2012-04-04 LAB — CBC
MCV: 67.8 fL — ABNORMAL LOW (ref 78.0–100.0)
Platelets: 313 10*3/uL (ref 150–400)
RBC: 3.69 MIL/uL — ABNORMAL LOW (ref 3.87–5.11)
RDW: 26 % — ABNORMAL HIGH (ref 11.5–15.5)
WBC: 10.1 10*3/uL (ref 4.0–10.5)

## 2012-04-04 LAB — BASIC METABOLIC PANEL
Calcium: 8.9 mg/dL (ref 8.4–10.5)
Creatinine, Ser: 1.65 mg/dL — ABNORMAL HIGH (ref 0.50–1.10)
GFR calc non Af Amer: 34 mL/min — ABNORMAL LOW (ref >90)
Sodium: 155 mEq/L — ABNORMAL HIGH (ref 135–145)

## 2012-04-04 LAB — GLUCOSE, CAPILLARY: Glucose-Capillary: 121 mg/dL — ABNORMAL HIGH (ref 70–99)

## 2012-04-04 MED ORDER — VITAL AF 1.2 CAL PO LIQD
1000.0000 mL | ORAL | Status: DC
  Administered 2012-04-04 – 2012-04-06 (×2): 1000 mL
  Filled 2012-04-04 (×4): qty 1000

## 2012-04-04 MED ORDER — FREE WATER
200.0000 mL | Freq: Three times a day (TID) | Status: DC
  Administered 2012-04-04 (×2): 200 mL

## 2012-04-04 MED ORDER — PREDNISONE 5 MG PO TABS
5.0000 mg | ORAL_TABLET | Freq: Every day | ORAL | Status: DC
  Filled 2012-04-04 (×2): qty 1

## 2012-04-04 MED ORDER — FUROSEMIDE 10 MG/ML IJ SOLN
40.0000 mg | Freq: Once | INTRAMUSCULAR | Status: AC
  Administered 2012-04-04: 40 mg via INTRAVENOUS
  Filled 2012-04-04: qty 4

## 2012-04-04 MED ORDER — FREE WATER
250.0000 mL | Freq: Four times a day (QID) | Status: DC
  Administered 2012-04-04 – 2012-04-05 (×5): 250 mL

## 2012-04-04 MED ORDER — PREDNISONE 5 MG PO TABS
5.0000 mg | ORAL_TABLET | Freq: Every day | ORAL | Status: DC
  Administered 2012-04-05 – 2012-04-06 (×2): 5 mg via ORAL
  Filled 2012-04-04 (×3): qty 1

## 2012-04-04 MED ORDER — PRO-STAT SUGAR FREE PO LIQD
60.0000 mL | Freq: Three times a day (TID) | ORAL | Status: DC
  Administered 2012-04-04 – 2012-04-07 (×8): 60 mL
  Filled 2012-04-04 (×10): qty 60

## 2012-04-04 NOTE — Progress Notes (Signed)
NUTRITION FOLLOW UP  Intervention:   Change TF to Vital AF 1.2 @ 30 ml/hr with 60 ml Prostat TID MVI daily Consider adding medication to slow stool output  New regimen will provide: 1464 kcal (68% of kcal needs/26 kcal/kg of ideal body weight), 144 grams protein ( >100% of needs), 583 ml H2O  Nutrition Dx:   Inadequate oral intake related to inability to eat as evidenced by NPO status; ongoing.  Goal:   Enteral nutrition to provide 60-70% of estimated calorie needs (22-25 kcals/kg ideal body weight) and >/= 90% of estimated protein needs, based on ASPEN guidelines for permissive underfeeding in critically ill obese individuals; met.   Monitor:   TF tolerance, vent status, weight  Assessment:   Patient is currently intubated on ventilator support. Pt positive Influenza A. Pt on ARDS protocol but is now off of paralytics.  Pt discussed with RN.  MV: 11.5 Temp:Temp (24hrs), Avg:99.2 F (37.3 C), Min:98.8 F (37.1 C), Max:99.5 F (37.5 C)   Patient has OGT in place. Oxepa @ 15 ml/hr with 60 ml Prostat QID providing: 1340 kcal, 142 grams protein, 282 ml H2O.  Free water flushes: 250 ml H2O every 6 hours providing: 1000 ml  Total free water: 1282 ml per day.  Residuals: 15 ml  Consult received due to ongoing diarrhea. Pt has been on multiple antibiotics. C. Diff PCR was negative on 12/14.   Height: Ht Readings from Last 1 Encounters:  No data found for Ht    Weight Status:   Wt Readings from Last 1 Encounters:  04/03/12 277 lb 12.5 oz (126 kg)    Re-estimated needs:  Kcal: 2153 Protein: >/= 142 grams protein Fluid: > 2 L/day   Skin: no issues noted  Diet Order:     Intake/Output Summary (Last 24 hours) at 04/04/12 1452 Last data filed at 04/04/12 1300  Gross per 24 hour  Intake 1267.05 ml  Output   2670 ml  Net -1402.95 ml    Last bm: Rectal pouch inserted on 12/13 Documented output: 12/14: 200 ml             12/15: 1000 ml             12/16: 300  ml Appears diarrhea started while on Vital AF 1.2.  Labs:   Lab 04/04/12 0515 04/03/12 0509 04/02/12 0502  NA 155* 147* 144  K 4.0 4.1 4.6  CL 118* 109 109  CO2 30 30 27   BUN 70* 64* 63*  CREATININE 1.65* 1.59* 1.57*  CALCIUM 8.9 8.8 9.0  MG -- 2.3 --  PHOS -- 4.2 --  GLUCOSE 115* 100* 99   CBG (last 3)   Basename 04/04/12 0830 04/03/12 0806 04/03/12 0343  GLUCAP 121* 116* 80    Scheduled Meds:    . antiseptic oral rinse  15 mL Mouth Rinse QID  . chlorhexidine  15 mL Mouth Rinse BID  . clonazePAM  1 mg Oral Q12H  . famotidine  20 mg Per Tube Q12H  . feeding supplement  60 mL Per Tube Q6H  . ferrous sulfate  300 mg Per Tube BID WC  . free water  250 mL Per Tube Q6H  . predniSONE  5 mg Oral Q breakfast   Continuous Infusions:    . feeding supplement (OXEPA) 1,000 mL (04/02/12 2154)  . fentaNYL infusion INTRAVENOUS 120 mcg/hr (04/04/12 1205)  . midazolam (VERSED) infusion 2 mg/hr (04/04/12 1314)       Kendell Bane RD, LDN,  CNSC [phone] Pager [phone] After Hours Pager

## 2012-04-04 NOTE — Progress Notes (Signed)
PULMONARY  / CRITICAL CARE MEDICINE  Name: Megan Espinoza MRN: [id number] DOB: 10-07-1955    LOS: 11  REFERRING MD :  Elizabeth Palau  CHIEF COMPLAINT:  Respiratory failure  BRIEF PATIENT DESCRIPTION:  56 yo female admitted 03/24/2012 fever, dry cough, chills, nausea 2nd to PNA complicated by anemia.  Developed worsening hypoxia 12/08 from ARDS and transfer to ICU with PCCM assuming care.  On 12/09 developed VDRF.  Found to have influenza A positive.   LINES / TUBES: ETT 12/09>> Rt [place] CVL 12/09>>  CULTURES: MRSA screen 12/5 >>negative Blood 12/5 >>negative Urine (clean catch) 12/5 >> diphtheroids (deemed contaminant)  Legionella Ag 12/09 >>negative Strep Ag 12/09 >>negtive Pneumocystis DFA 12/09 >>negative AFB smear 12/09 >>negative Fungal smear/cx 12/09 >>negative Legionella cx 12/09 >>negative resp virus panel 12/09 >> Influenza A POSITIVE  Resp 12/09 >>negative C diff PCR 12/14 >> negative  ANTIBIOTICS: Aztreonam 12/5 >> 12/11  oseltamivir 12/08 >> 12/10  TMP/SMX 12/09 >> 12/10  Levofloxacin 12/5 >> 12/12  vanco 12/5 >> 12/12  oseltamivir 12/11 >>   SIGNIFICANT EVENTS:  12/08 Transfer to ICU 12/09 VDRF, ARDS, nimbex started, steroids started 12/10 Transfuse PRBC, Pressors started 12/11 Nimbex stopped  TESTS: 12/06 CT chest >>diffuse b/l patchy ASD with air bronchograms 12/09 Bronchoscopy>>minimal secretions, normal airways, blood tinged BAL 12/09 RF, Ant-DS DNA, SCL-70 Ab, HIV negative/non-reactive  LEVEL OF CARE:  ICU PRIMARY SERVICE:  PCCM CONSULTANTS:  None CODE STATUS DIET:  Tube feeds DVT Px:  SQ heparin GI Px:  Pepcid   INTERVAL HISTORY:  Respiratory mechanics improved.  Decreased FiO2/PEEP.  VITAL SIGNS: Temp:  [99 F (37.2 C)-99.5 F (37.5 C)] 99.2 F (37.3 C) (12/16 0831) Pulse Rate:  [87-122] 91  (12/16 0800) Resp:  [24-27] 26  (12/16 0600) BP: (95-158)/(34-83) 101/51 mmHg (12/16 0800) SpO2:  [92 %-100 %] 97 % (12/16  0800) FiO2 (%):  [50 %] 50 % (12/16 0800) HEMODYNAMICS:   VENTILATOR SETTINGS: Vent Mode:  [-] PRVC FiO2 (%):  [50 %] 50 % Set Rate:  [26 bmp] 26 bmp Vt Set:  [460 mL] 460 mL PEEP:  [10 cmH20] 10 cmH20 Plateau Pressure:  [25 cmH20-26 cmH20] 25 cmH20 INTAKE / OUTPUT: Intake/Output      12/15 0701 - 12/16 0700 12/16 0701 - 12/17 0700   I.V. (mL/kg) 521.6 (4.1) 21 (0.2)   Other 20    NG/GT 695 55   IV Piggyback 4    Total Intake(mL/kg) 1240.6 (9.8) 76 (0.6)   Urine (mL/kg/hr) 2360 (0.8) 140   Stool 1000 300   Total Output 3360 440   Net -2119.4 -364          PHYSICAL EXAMINATION: General:  Ill appearing Neuro:  Follows commands HEENT:  ETT in place Cardiovascular:  s1s2, regular, no murmur Lungs:  B/l rhonchi Abdomen:  Soft, non tender Musculoskeletal:  1+ edema Skin:  No rashes   LABS: Cbc  Lab 04/04/12 0515 04/03/12 0509 04/02/12 0502  WBC 10.1 -- --  HGB 7.5* 8.2* 7.8*  HCT 25.0* 27.1* 25.4*  PLT 313 319 306    Chemistry   Lab 04/04/12 0515 04/03/12 0509 04/02/12 0502  NA 155* 147* 144  K 4.0 4.1 4.6  CL 118* 109 109  CO2 30 30 27   BUN 70* 64* 63*  CREATININE 1.65* 1.59* 1.57*  CALCIUM 8.9 8.8 9.0  MG -- 2.3 --  PHOS -- 4.2 --  GLUCOSE 115* 100* 99    Liver fxn  Lab  03/31/12 0413 03/29/12 0930  AST 21 42*  ALT 25 29  ALKPHOS 72 79  BILITOT 0.2* 0.2*  PROT 6.0 5.9*  ALBUMIN 2.0* 1.8*   coags No results found for this basename: APTT:3,INR:3 in the last 168 hours Sepsis markers  Lab 03/30/12 0430 03/29/12 0930 03/28/12 1400  LATICACIDVEN -- -- --  PROCALCITON 0.14 0.21 0.17   Cardiac markers No results found for this basename: CKTOTAL:3,CKMB:3,TROPONINI:3 in the last 168 hours BNP No results found for this basename: PROBNP:3 in the last 168 hours ABG  Lab 04/02/12 1133 03/29/12 1902 03/28/12 2107  PHART 7.394 7.371 7.329*  PCO2ART 47.2* 48.1* 60.5*  PO2ART 87.4 68.2* 86.0  HCO3 28.1* 27.5* 30.9*  TCO2 29.5 29.0 32.8    CBG  trend  Lab 04/04/12 0830 04/03/12 0806 04/03/12 0343 04/02/12 2328 04/02/12 1946  GLUCAP 121* 116* 80 91 107*    IMAGING: Dg Chest Port 1 View  04/04/2012  *RADIOLOGY REPORT*  Clinical Data: Follow-up evaluation of the ARDS.  PORTABLE CHEST - 1 VIEW  Comparison: Chest x-ray 04/03/2012.  Findings: An endotracheal tube is in place with tip 3.1 cm above the carina. A nasogastric tube is seen extending into the stomach, however, the tip of the nasogastric tube extends below the lower margin of the image. There is a right-sided subclavian central venous catheter with tip terminating in the proximal superior vena cava. Lung volumes are low.  Bibasilar opacities may reflect areas of atelectasis and/or consolidation. Patchy multifocal interstitial and airspace disease throughout the remainder of the lungs.  Small bilateral pleural effusions.  Crowding of pulmonary vasculature, accentuated by low lung volumes, without frank pulmonary edema. Heart size is upper limits of normal. The patient is rotated to the left on today's exam, resulting in distortion of the mediastinal contours and reduced diagnostic sensitivity and specificity for mediastinal pathology.  IMPRESSION: 1.  Patchy multifocal interstitial and airspace disease, particularly in the lung bases bilaterally, concerning for multilobar pneumonia.  Likely superimposed bibasilar atelectasis. 2.  Small bilateral pleural effusions   Original Report Authenticated By: Trudie Reed, M.D.    Dg Chest Port 1 View  04/03/2012  *RADIOLOGY REPORT*  Clinical Data: Follow-up respiratory failure  PORTABLE CHEST - 1 VIEW  Comparison: Chest radiograph 03/23/2012  Findings: Endotracheal tube, NG tube, and right central venous line are unchanged.  There is interval decreased lung volumes.  There is diffuse patchy bilateral air space disease which is not improved compared to prior.  No pneumothorax.  IMPRESSION:  1.  Stable support apparatus.  2.  Decrease in lung  volumes.  3.  No change in diffuse patchy bilateral air space disease.   Original Report Authenticated By: Genevive Bi, M.D.      DIAGNOSES: Principal Problem:  *Acute respiratory failure with hypoxia Active Problems:  Microcytic anemia  Obesity (BMI 30.0-34.9)  ARDS (adult respiratory distress syndrome)  Steroid-induced hyperglycemia  Acute renal insufficiency  Influenza A   ASSESSMENT / PLAN:  PULMONARY  ASSESSMENT: Acute respiratory failure with ARDS 2nd to Influenza pneumonia. Respiratory mechanics improved with diuresis. PLAN:   Full vent support for now Adjust PEEP/FiO2 to keep SpO2 > 88% F/u CXR Changed BD's to prn Change prednisone to 5 mg daily  CARDIOVASCULAR  ASSESSMENT:  Septic shock 2nd to PNA. Resolved. Hypertension, Sinus tachycardia. Likely related to agitation, respiratory distress.  Improved 12/16. Hypervolemia. PLAN:  Lasix 40 mg x 1 doses 12/14 Lopressor prn for SBP > 170, HR > 120  RENAL  ASSESSMENT:  Acute renal failure. Improving. Hypernatremia. Likely from diuresis. PLAN:   Goal negative fluid balance Add free water while getting lasix F/u renal fx, electrolytes, urine outpt  GASTROINTESTINAL  ASSESSMENT:  Nutrition. Diarrhea. PLAN:   Continue tube feeds >> will ask nutrition to adjust tube feeds due to concern for osmotic diarrhea Continue flexiseal Changed protonix to pepcid 12/14 D/c'ed colace  HEMATOLOGIC  ASSESSMENT:   Iron deficiency anemia.  No evidence for bleeding. Ferritin 94, Iron < 10 from 12/05. PLAN:  Continue FeSO4 F/u CBC Transfuse for Hb < 7 Added SQ heparin for DVT prophylaxis 12/14  INFECTIOUS  ASSESSMENT:   Influenza A pneumonia. PLAN:   Continue tamiflu  ENDOCRINE  ASSESSMENT:   Steroid induced hyperglycemia.  Resolved with reduction in steroid dose.  PLAN:   D/c SSI  NEUROLOGIC  ASSESSMENT:   Agitation. Improved 12/15. At risk for critical illness polyneuropathy. Was on  paralytic, and getting steroids. PLAN:   Added low dose klonopin 12/14 Sedation protocol while on vent   Critical care time 35 minutes.  Coralyn Helling, MD [facility] Pulmonary/Critical Care 04/04/2012, 8:59 AM Pager:  [phone] After 3pm call: [phone]

## 2012-04-05 ENCOUNTER — Inpatient Hospital Stay (HOSPITAL_COMMUNITY): Payer: MEDICAID

## 2012-04-05 LAB — BASIC METABOLIC PANEL
CO2: 29 mEq/L (ref 19–32)
Glucose, Bld: 128 mg/dL — ABNORMAL HIGH (ref 70–99)
Potassium: 3.6 mEq/L (ref 3.5–5.1)
Sodium: 152 mEq/L — ABNORMAL HIGH (ref 135–145)

## 2012-04-05 LAB — CBC
Hemoglobin: 7.9 g/dL — ABNORMAL LOW (ref 12.0–15.0)
MCH: 20.2 pg — ABNORMAL LOW (ref 26.0–34.0)
MCV: 69.3 fL — ABNORMAL LOW (ref 78.0–100.0)
RBC: 3.91 MIL/uL (ref 3.87–5.11)
WBC: 13.9 10*3/uL — ABNORMAL HIGH (ref 4.0–10.5)

## 2012-04-05 MED ORDER — MIDAZOLAM HCL 2 MG/2ML IJ SOLN
2.0000 mg | INTRAMUSCULAR | Status: DC | PRN
  Administered 2012-04-05 – 2012-04-07 (×3): 4 mg via INTRAVENOUS
  Filled 2012-04-05 (×3): qty 4

## 2012-04-05 MED ORDER — HALOPERIDOL LACTATE 5 MG/ML IJ SOLN
5.0000 mg | Freq: Four times a day (QID) | INTRAMUSCULAR | Status: DC
  Administered 2012-04-05 – 2012-04-07 (×6): 5 mg via INTRAVENOUS
  Filled 2012-04-05 (×13): qty 1

## 2012-04-05 MED ORDER — FREE WATER
250.0000 mL | Status: DC
  Administered 2012-04-05 – 2012-04-06 (×6): 250 mL

## 2012-04-05 MED ORDER — ADULT MULTIVITAMIN LIQUID CH
5.0000 mL | Freq: Every day | ORAL | Status: DC
  Administered 2012-04-05 – 2012-04-08 (×4): 5 mL
  Filled 2012-04-05 (×5): qty 5

## 2012-04-05 NOTE — Progress Notes (Signed)
Agitation and discomfort on the vent   Intermittent Versed ordered

## 2012-04-05 NOTE — Progress Notes (Signed)
NUTRITION FOLLOW UP  Intervention:   Changed TF to Vital AF 1.2 @ 30 ml/hr with 60 ml Prostat TID on 12/16 Consider adding medication to slow stool output   New regimen will provide: 1464 kcal (68% of kcal needs/26 kcal/kg of ideal body weight), 144 grams protein ( >100% of needs), 583 ml H2O  Nutrition Dx:   Inadequate oral intake related to inability to eat as evidenced by NPO status; ongoing.  Goal:   Enteral nutrition to provide 60-70% of estimated calorie needs (22-25 kcals/kg ideal body weight) and >/= 90% of estimated protein needs, based on ASPEN guidelines for permissive underfeeding in critically ill obese individuals; met.   Monitor:   TF tolerance, vent status, weight  Assessment:   Patient is currently intubated on ventilator support. Pt positive Influenza A. Pt on ARDS protocol but is now off of paralytics.  Pt discussed during ICU rounds and with RN.  MV: 11.1 Temp:Temp (24hrs), Avg:99.1 F (37.3 C), Min:98.2 F (36.8 C), Max:100.3 F (37.9 C)  Last bm: Rectal pouch inserted on 12/13 Documented output: 12/14: 200 ml             12/15: 1000 ml             12/16: 600 ml              Appears diarrhea started 12/13, pt was on Vital AF 1.2 at the time.  Spoke with MD to notify of TF change yesterday, asked about started medication to slow stooling and MD would like to wait and check tomorrow.   Patient has OG tube in place with tip of tube below diaphragm per xray. Vital AF 1.2 is infusing @ 30 ml/hr. 60 ml Prostat via tube TID. Tube feeding regimen currently providing  1464 kcal, 144 grams protein, and 583 ml H2O.   Free water flushes: 250 ml every 4 hrs providing 1500 of free water.  Total free water: 2083 ml per day.  Residuals: 15 ml  Consult received due to ongoing diarrhea. Pt has been on multiple antibiotics. C. Diff PCR was negative on 12/14.   Height: Ht Readings from Last 1 Encounters:  No data found for Ht    Weight Status:   Wt Readings from  Last 1 Encounters:  04/05/12 269 lb 2.9 oz (122.1 kg)    Re-estimated needs:  Kcal: 2153 Protein: >/= 142 grams protein Fluid: > 2 L/day   Skin: no issues noted  Diet Order: Dysphagia   Intake/Output Summary (Last 24 hours) at 04/05/12 1107 Last data filed at 04/05/12 1000  Gross per 24 hour  Intake 1609.74 ml  Output   3520 ml  Net -1910.26 ml   Labs:   Lab 04/05/12 0355 04/04/12 0515 04/03/12 0509  NA 152* 155* 147*  K 3.6 4.0 4.1  CL 114* 118* 109  CO2 29 30 30   BUN 65* 70* 64*  CREATININE 1.47* 1.65* 1.59*  CALCIUM 8.9 8.9 8.8  MG -- -- 2.3  PHOS -- -- 4.2  GLUCOSE 128* 115* 100*   CBG (last 3)   Basename 04/04/12 0830 04/03/12 0806 04/03/12 0343  GLUCAP 121* 116* 80    Scheduled Meds:    . antiseptic oral rinse  15 mL Mouth Rinse QID  . chlorhexidine  15 mL Mouth Rinse BID  . famotidine  20 mg Per Tube Q12H  . feeding supplement  60 mL Per Tube TID  . feeding supplement (VITAL AF 1.2 CAL)  1,000 mL Per  Tube Q24H  . ferrous sulfate  300 mg Per Tube BID WC  . free water  250 mL Per Tube Q4H  . haloperidol lactate  5 mg Intravenous Q6H  . multivitamin  5 mL Per Tube Daily  . predniSONE  5 mg Oral Q breakfast   Continuous Infusions:    . fentaNYL infusion INTRAVENOUS 80 mcg/hr (04/05/12 1000)  . midazolam (VERSED) infusion Stopped (04/05/12 1015)       Kendell Bane RD, LDN, CNSC [phone] Pager [phone] After Hours Pager

## 2012-04-05 NOTE — Progress Notes (Addendum)
PULMONARY  / CRITICAL CARE MEDICINE  Name: Megan Espinoza MRN: [id number] DOB: 1955-12-11    LOS: 12 Date of admit: 03/24/2012  7:37 PM   REFERRING MD :  Elizabeth Palau  CHIEF COMPLAINT:  Respiratory failure  BRIEF PATIENT DESCRIPTION:  56 yo female admitted 03/24/2012 fever, dry cough, chills, nausea 2nd to PNA complicated by anemia.  Developed worsening hypoxia 12/08 from ARDS and transfer to ICU with PCCM assuming care.  On 12/09 developed VDRF due to ARDS.  Found to have influenza A positive.   LEVEL OF CARE:  ICU PRIMARY SERVICE:  PCCM CONSULTANTS:  None CODE STATUS DIET:  Tube feeds DVT Px:  SQ heparin GI Px:  Pepcid    LINES / TUBES: ETT 12/09>> Rt [place] CVL 12/09>>  CULTURES: MRSA screen 12/5 >>negative Blood 12/5 >>negative Urine (clean catch) 12/5 >> diphtheroids (deemed contaminant)  Legionella Ag 12/09 >>negative Strep Ag 12/09 >>negtive Pneumocystis DFA 12/09 >>negative AFB smear 12/09 >>negative Fungal smear/cx 12/09 >>negative Legionella cx 12/09 >>negative resp virus panel 12/09 >> Influenza A POSITIVE  Resp 12/09 >>negative C diff PCR 12/14 >> negative  ANTIBIOTICS: Aztreonam 12/5 >> 12/11  oseltamivir 12/08 >> 12/10 , 12/11 >>12/15 TMP/SMX 12/09 >> 12/10  Levofloxacin 12/5 >> 12/12  vanco 12/5 >> 12/12     TESTS: 12/06 CT chest >>diffuse b/l patchy ASD with air bronchograms 12/09 Bronchoscopy>>minimal secretions, normal airways, blood tinged BAL 12/09 RF, Ant-DS DNA, SCL-70 Ab, HIV negative/non-reactive    SIGNIFICANT EVENTS:  12/08 Transfer to ICU 12/09 VDRF, ARDS, nimbex started, steroids started 12/10 Transfuse PRBC, Pressors started 12/11 Nimbex stopped  INTERVAL HISTORY:   04/05/12: On 40% fio2. Not on pressors. Doing SBT. Aigated on wUA .  VITAL SIGNS: Temp:  [98.2 F (36.8 C)-100.3 F (37.9 C)] 98.9 F (37.2 C) (12/17 0800) Pulse Rate:  [93-131] 100  (12/17 0900) Resp:  [20-32] 26  (12/17 0900) BP:  (113-161)/(47-103) 130/56 mmHg (12/17 0900) SpO2:  [94 %-98 %] 98 % (12/17 0900) FiO2 (%):  [39.6 %-40.6 %] 39.9 % (12/17 0900) Weight:  [122.1 kg (269 lb 2.9 oz)] 122.1 kg (269 lb 2.9 oz) (12/17 0300) HEMODYNAMICS:   VENTILATOR SETTINGS: Vent Mode:  [-] PSV;CPAP FiO2 (%):  [39.6 %-40.6 %] 39.9 % Set Rate:  [14 bmp] 14 bmp Vt Set:  [460 mL] 460 mL PEEP:  [5 cmH20-8 cmH20] 5 cmH20 Pressure Support:  [10 cmH20] 10 cmH20 Plateau Pressure:  [18 cmH20-24 cmH20] 20 cmH20 INTAKE / OUTPUT: Intake/Output      12/16 0701 - 12/17 0700 12/17 0701 - 12/18 0700   I.V. (mL/kg) 188.3 (1.5) 135.6 (1.1)   Other 30    NG/GT 1340 310   IV Piggyback 4    Total Intake(mL/kg) 1562.3 (12.8) 445.6 (3.6)   Urine (mL/kg/hr) 2475 (0.8) 275   Stool 1300    Total Output 3775 275   Net -2212.7 +170.6          PHYSICAL EXAMINATION: General: Critically Ill appearing Neuro:  Follows commands but verty agitated RASS +3/+4 on WUA (fentanyl 80 and versed 2 gtt) and scheduled klonopin) HEENT:  ETT in place Cardiovascular:  s1s2, regular, no murmur Lungs:  B/l rhonchi Abdomen:  Soft, non tender Musculoskeletal:  1+ edema Skin:  No rashes   LABS: Cbc  Lab 04/05/12 0355 04/04/12 0515 04/03/12 0509  WBC 13.9* -- --  HGB 7.9* 7.5* 8.2*  HCT 27.1* 25.0* 27.1*  PLT 412* 313 319    Chemistry  Lab 04/05/12 0355 04/04/12 0515 04/03/12 0509  NA 152* 155* 147*  K 3.6 4.0 4.1  CL 114* 118* 109  CO2 29 30 30   BUN 65* 70* 64*  CREATININE 1.47* 1.65* 1.59*  CALCIUM 8.9 8.9 8.8  MG -- -- 2.3  PHOS -- -- 4.2  GLUCOSE 128* 115* 100*    Liver fxn  Lab 03/31/12 0413  AST 21  ALT 25  ALKPHOS 72  BILITOT 0.2*  PROT 6.0  ALBUMIN 2.0*   coags No results found for this basename: APTT:3,INR:3 in the last 168 hours Sepsis markers  Lab 03/30/12 0430  LATICACIDVEN --  PROCALCITON 0.14   Cardiac markers No results found for this basename: CKTOTAL:3,CKMB:3,TROPONINI:3 in the last 168 hours BNP No  results found for this basename: PROBNP:3 in the last 168 hours ABG  Lab 04/02/12 1133 03/29/12 1902  PHART 7.394 7.371  PCO2ART 47.2* 48.1*  PO2ART 87.4 68.2*  HCO3 28.1* 27.5*  TCO2 29.5 29.0    CBG trend  Lab 04/04/12 0830 04/03/12 0806 04/03/12 0343 04/02/12 2328 04/02/12 1946  GLUCAP 121* 116* 80 91 107*    IMAGING: Dg Chest Port 1 View  04/05/2012  *RADIOLOGY REPORT*  Clinical Data: Evaluate ARDS  PORTABLE CHEST - 1 VIEW  Comparison: 04/04/2012  Findings: Patchy airspace opacities persist in the perihilar lower lung zones.  There is no pneumothorax.  The endotracheal tube has its tip 1 cm above the carina.  It appears lower than on the previous exam although the difference is likely due to differences in neck positioning.  A right subclavian central venous line and a nasogastric tube are stable and well positioned.  IMPRESSION: Allowing for differences in technique, patient positioning and lung volume, there has been no significant change from the previous day's study.  Persistent bilateral airspace opacities.  Bilateral pneumonia is suspected.   Original Report Authenticated By: Amie Portland, M.D.    Dg Chest Port 1 View  04/04/2012  *RADIOLOGY REPORT*  Clinical Data: Follow-up evaluation of the ARDS.  PORTABLE CHEST - 1 VIEW  Comparison: Chest x-ray 04/03/2012.  Findings: An endotracheal tube is in place with tip 3.1 cm above the carina. A nasogastric tube is seen extending into the stomach, however, the tip of the nasogastric tube extends below the lower margin of the image. There is a right-sided subclavian central venous catheter with tip terminating in the proximal superior vena cava. Lung volumes are low.  Bibasilar opacities may reflect areas of atelectasis and/or consolidation. Patchy multifocal interstitial and airspace disease throughout the remainder of the lungs.  Small bilateral pleural effusions.  Crowding of pulmonary vasculature, accentuated by low lung volumes, without  frank pulmonary edema. Heart size is upper limits of normal. The patient is rotated to the left on today's exam, resulting in distortion of the mediastinal contours and reduced diagnostic sensitivity and specificity for mediastinal pathology.  IMPRESSION: 1.  Patchy multifocal interstitial and airspace disease, particularly in the lung bases bilaterally, concerning for multilobar pneumonia.  Likely superimposed bibasilar atelectasis. 2.  Small bilateral pleural effusions   Original Report Authenticated By: Trudie Reed, M.D.      DIAGNOSES: Principal Problem:  *Acute respiratory failure with hypoxia Active Problems:  Microcytic anemia  Obesity (BMI 30.0-34.9)  ARDS (adult respiratory distress syndrome)  Steroid-induced hyperglycemia  Acute renal insufficiency  Influenza A   ASSESSMENT / PLAN:  PULMONARY  ASSESSMENT: Acute respiratory failure with ARDS 2nd to Influenza pneumonia. Respiratory mechanics improved with diuresis on 04/02/12  On 04/05/12: Doing SBT but Agitated delirium precludes extubaion  PLAN:   SBT as tolerated; No extubation Full vent support for now prednisone at 5 mg daily (need to figure out duration and indication)  CARDIOVASCULAR No results found for this basename: PROBNP:5 in the last 168 hours  ASSESSMENT:  Septic shock 2nd to PNA. Resolved.  On 04/05/12: Not on pressors. HR 104 with BP 130/56   PLAN:  Lopressor prn for SBP > 170, HR > 120 Check bnp 04/06/12 to decide on more diuresis  RENAL  Lab 04/05/12 0355 04/04/12 0515 04/03/12 0509 04/02/12 0502 04/01/12 0545  NA 152* 155* 147* 144 140  K 3.6 4.0 -- -- --  CL 114* 118* 109 109 104  CO2 29 30 30 27 28   GLUCOSE 128* 115* 100* 99 137*  BUN 65* 70* 64* 63* 58*  CREATININE 1.47* 1.65* 1.59* 1.57* 1.92*  CALCIUM 8.9 8.9 8.8 9.0 8.7  MG -- -- 2.3 -- --  PHOS -- -- 4.2 -- --    ASSESSMENT:   Acute renal failure. Hypernatremia. Likely from diuresis.   - on 04/05/12: Na high but  creat slowly imprving  PLAN:   Check bnp to decide on more lasix Increase free water Track bmet  GASTROINTESTINAL  ASSESSMENT:  Nutrition. Diarrhea. - C Diff neg, Likely tube feed related. PPI changed to pepcid 04/02/12 PLAN:   Continue tube feeds  Continue flexiseal Nutrition consult 04/05/12 to alter tube feeds   HEMATOLOGIC  Lab 04/05/12 0355 04/04/12 0515 04/03/12 0509  HGB 7.9* 7.5* 8.2*  HCT 27.1* 25.0* 27.1*  WBC 13.9* 10.1 9.2  PLT 412* 313 319    ASSESSMENT:   Iron deficiency anemia.  No evidence for bleeding. Marland Kitchen PLAN:  Continue FeSO4 F/u CBC Transfuse for Hb < 7 Continue SQ heparin for DVT prophylaxis since 12/14  INFECTIOUS  Lab 03/30/12 0430  PROCALCITON 0.14    ASSESSMENT:   Influenza A pneumonia. S/p tamiflu Rx ending 12/15  PLAN:   Monitor off tamiflu   ENDOCRINE  ASSESSMENT:   Steroid induced hyperglycemia.  Resolved with reduction in steroid dose.  PLAN:   D/c SSI  NEUROLOGIC  ASSESSMENT:   At risk for critical illness polyneuropathy (Was on paralytic, and getting steroids).  On 04/05/12: very agitated, ICU DELIRIUUM + moves all 4s. No evidence of crit illness myo/neuro pathy  PLAN:   Dc klonopin that was added 12/14 Start scheduled haldol 04/05/12 (qtc ) Sedation protocol while on vent Wean versed off as possible Dc versed prn    GLOBAL  - no family at bedside 04/05/12  The patient is critically ill with multiple organ systems failure and requires high complexity decision making for assessment and support, frequent evaluation and titration of therapies, application of advanced monitoring technologies and extensive interpretation of multiple databases.   Critical Care Time devoted to patient care services described in this note is  35  Minutes.  Dr. Kalman Shan, M.D., [facility].C.P Pulmonary and Critical Care Medicine Staff Physician [facility] [place] [facility] Pager: [phone], If  no answer or between  15:00h - 7:00h: call [phone]  04/05/2012 10:02 AM

## 2012-04-06 ENCOUNTER — Inpatient Hospital Stay (HOSPITAL_COMMUNITY): Payer: MEDICAID

## 2012-04-06 LAB — BASIC METABOLIC PANEL
BUN: 53 mg/dL — ABNORMAL HIGH (ref 6–23)
Chloride: 118 mEq/L — ABNORMAL HIGH (ref 96–112)
Creatinine, Ser: 1.23 mg/dL — ABNORMAL HIGH (ref 0.50–1.10)
GFR calc Af Amer: 56 mL/min — ABNORMAL LOW (ref >90)
Glucose, Bld: 116 mg/dL — ABNORMAL HIGH (ref 70–99)

## 2012-04-06 LAB — PRO B NATRIURETIC PEPTIDE: Pro B Natriuretic peptide (BNP): 420.9 pg/mL — ABNORMAL HIGH (ref 0–125)

## 2012-04-06 LAB — CBC
HCT: 25.8 % — ABNORMAL LOW (ref 36.0–46.0)
Hemoglobin: 7.6 g/dL — ABNORMAL LOW (ref 12.0–15.0)
MCV: 69.7 fL — ABNORMAL LOW (ref 78.0–100.0)
RDW: 26.6 % — ABNORMAL HIGH (ref 11.5–15.5)
WBC: 11.1 10*3/uL — ABNORMAL HIGH (ref 4.0–10.5)

## 2012-04-06 MED ORDER — FUROSEMIDE 10 MG/ML IJ SOLN
40.0000 mg | Freq: Four times a day (QID) | INTRAMUSCULAR | Status: AC
  Administered 2012-04-06 (×2): 40 mg via INTRAVENOUS
  Filled 2012-04-06 (×2): qty 4

## 2012-04-06 MED ORDER — DEXMEDETOMIDINE HCL IN NACL 200 MCG/50ML IV SOLN
0.2000 ug/kg/h | INTRAVENOUS | Status: DC
  Administered 2012-04-06 – 2012-04-07 (×6): 0.4 ug/kg/h via INTRAVENOUS
  Filled 2012-04-06 (×6): qty 50

## 2012-04-06 MED ORDER — FREE WATER
250.0000 mL | Status: DC
  Administered 2012-04-06 – 2012-04-07 (×13): 250 mL

## 2012-04-06 MED ORDER — POTASSIUM CHLORIDE 20 MEQ/15ML (10%) PO LIQD
40.0000 meq | Freq: Once | ORAL | Status: AC
  Administered 2012-04-06: 40 meq
  Filled 2012-04-06: qty 30

## 2012-04-06 NOTE — Progress Notes (Signed)
eLink Physician-Brief Progress Note Patient Name: Megan Espinoza DOB: 11/07/1955 MRN: [id number]  Date of Service  04/06/2012   HPI/Events of Note  Hypokalemia   eICU Interventions  Potassium replaced   Intervention Category Minor Interventions: Electrolytes abnormality - evaluation and management  DETERDING,ELIZABETH 04/06/2012, 5:38 AM

## 2012-04-06 NOTE — Progress Notes (Signed)
PULMONARY  / CRITICAL CARE MEDICINE  Name: Megan Espinoza MRN: [id number] DOB: Sep 12, 1955    LOS: 13 Date of admit: 03/24/2012  7:37 PM   REFERRING MD :  Elizabeth Palau  CHIEF COMPLAINT:  Respiratory failure  BRIEF PATIENT DESCRIPTION:  56 yo female admitted 03/24/2012 fever, dry cough, chills, nausea 2nd to PNA complicated by anemia.  Developed worsening hypoxia 12/08 from ARDS and transfer to ICU with PCCM assuming care.  On 12/09 developed VDRF due to ARDS.  Found to have influenza A positive.   LEVEL OF CARE:  ICU PRIMARY SERVICE:  PCCM CONSULTANTS:  None CODE STATUS DIET:  Tube feeds DVT Px:  SQ heparin GI Px:  Pepcid    LINES / TUBES: ETT 12/09>> Rt [place] CVL 12/09>>  CULTURES: MRSA screen 12/5 >>negative Blood 12/5 >>negative Urine (clean catch) 12/5 >> diphtheroids (deemed contaminant)  Legionella Ag 12/09 >>negative Strep Ag 12/09 >>negtive Pneumocystis DFA 12/09 >>negative AFB smear 12/09 >>negative Fungal smear/cx 12/09 >>negative Legionella cx 12/09 >>negative resp virus panel 12/09 >> Influenza A POSITIVE  Resp 12/09 >>negative C diff PCR 12/14 >> negative  ANTIBIOTICS: Aztreonam 12/5 >> 12/11  oseltamivir 12/08 >> 12/10 , 12/11 >>12/15 TMP/SMX 12/09 >> 12/10  Levofloxacin 12/5 >> 12/12  vanco 12/5 >> 12/12     TESTS: 12/06 CT chest >>diffuse b/l patchy ASD with air bronchograms 12/09 Bronchoscopy>>minimal secretions, normal airways, blood tinged BAL 12/09 RF, Ant-DS DNA, SCL-70 Ab, HIV negative/non-reactive    SIGNIFICANT EVENTS:  12/08 Transfer to ICU 12/09 VDRF, ARDS, nimbex started, steroids started 12/10 Transfuse PRBC, Pressors started 12/11 Nimbex stopped  INTERVAL HISTORY:   04/06/12: agitated on wua, very shallow breathing on 5/5 .  VITAL SIGNS: Temp:  [98.6 F (37 C)-99.5 F (37.5 C)] 99.1 F (37.3 C) (12/18 0800) Pulse Rate:  [97-120] 116  (12/18 0844) Resp:  [19-30] 23  (12/18 0844) BP: (126-153)/(42-86)  143/86 mmHg (12/18 0844) SpO2:  [94 %-100 %] 96 % (12/18 0844) FiO2 (%):  [39.7 %-40.3 %] 40 % (12/18 0844) HEMODYNAMICS:   VENTILATOR SETTINGS: Vent Mode:  [-] PRVC FiO2 (%):  [39.7 %-40.3 %] 40 % Set Rate:  [14 bmp] 14 bmp Vt Set:  [460 mL] 460 mL PEEP:  [5 cmH20] 5 cmH20 Plateau Pressure:  [0 cmH20-21 cmH20] 20 cmH20 INTAKE / OUTPUT: Intake/Output      12/17 0701 - 12/18 0700 12/18 0701 - 12/19 0700   I.V. (mL/kg) 231.6 (1.9)    Other     NG/GT 1470 30   IV Piggyback     Total Intake(mL/kg) 1701.6 (13.9) 30 (0.2)   Urine (mL/kg/hr) 1600 (0.5)    Stool 1100    Total Output 2700    Net -998.4 +30        Stool Occurrence 1100 x      PHYSICAL EXAMINATION:  Gen: awake on vent HEENT: NCAT, PERRL, ETT in place PULM: lungs with few crackles in bases CV: RRR no mgr Ab: BS+, soft, nontender Ext: trace edema Neuro: Awake, alert, follows commands, anxious, maew  LABS: Cbc  Lab 04/06/12 0423 04/05/12 0355 04/04/12 0515  WBC 11.1* -- --  HGB 7.6* 7.9* 7.5*  HCT 25.8* 27.1* 25.0*  PLT 398 412* 313    Chemistry   Lab 04/06/12 0423 04/05/12 0355 04/04/12 0515 04/03/12 0509  NA 154* 152* 155* --  K 3.3* 3.6 4.0 --  CL 118* 114* 118* --  CO2 27 29 30  --  BUN 53* 65* 70* --  CREATININE 1.23* 1.47* 1.65* --  CALCIUM 8.9 8.9 8.9 --  MG 2.1 -- -- 2.3  PHOS 3.3 -- -- 4.2  GLUCOSE 116* 128* 115* --    Liver fxn  Lab 03/31/12 0413  AST 21  ALT 25  ALKPHOS 72  BILITOT 0.2*  PROT 6.0  ALBUMIN 2.0*   coags No results found for this basename: APTT:3,INR:3 in the last 168 hours Sepsis markers No results found for this basename: LATICACIDVEN:3,PROCALCITON:3 in the last 168 hours Cardiac markers No results found for this basename: CKTOTAL:3,CKMB:3,TROPONINI:3 in the last 168 hours BNP  Lab 04/06/12 0423  PROBNP 420.9*   ABG  Lab 04/02/12 1133  PHART 7.394  PCO2ART 47.2*  PO2ART 87.4  HCO3 28.1*  TCO2 29.5    CBG trend  Lab 04/04/12 0830 04/03/12 0806  04/03/12 0343 04/02/12 2328 04/02/12 1946  GLUCAP 121* 116* 80 91 107*    IMAGING: Dg Chest Port 1 View  04/06/2012  *RADIOLOGY REPORT*  Clinical Data: Evaluate endotracheal tube  PORTABLE CHEST - 1 VIEW  Comparison: 04/05/2012; 04/04/2012; 04/03/2012  Findings: Grossly unchanged cardiac silhouette and mediastinal contours.  Interval retraction of endotracheal tube with tip now approximately 4.9 cm above the carina.  Otherwise, stable positioning of remaining support apparatus.  Lung volumes remain persistently reduced.  Minimally improved aeration of the right lower lung with persistent bibasilar heterogeneous air space opacities, left greater than right.  Trace bilateral effusions are likely unchanged. No pneumothorax.  Unchanged bones.  IMPRESSION: 1.  Interval retraction of endotracheal tube with tip now approximately 4.9 cm above the carina.  Otherwise, stable position support apparatus.  No pneumothorax. 2.  Minimal improved right lower lobe atelectasis with persistent bibasilar predominant airspace opacities worrisome for multifocal infection.   Original Report Authenticated By: Tacey Ruiz, MD    Dg Chest Port 1 View  04/05/2012  *RADIOLOGY REPORT*  Clinical Data: Evaluate ARDS  PORTABLE CHEST - 1 VIEW  Comparison: 04/04/2012  Findings: Patchy airspace opacities persist in the perihilar lower lung zones.  There is no pneumothorax.  The endotracheal tube has its tip 1 cm above the carina.  It appears lower than on the previous exam although the difference is likely due to differences in neck positioning.  A right subclavian central venous line and a nasogastric tube are stable and well positioned.  IMPRESSION: Allowing for differences in technique, patient positioning and lung volume, there has been no significant change from the previous day's study.  Persistent bilateral airspace opacities.  Bilateral pneumonia is suspected.   Original Report Authenticated By: Amie Portland, M.D.       DIAGNOSES: Principal Problem:  *Acute respiratory failure with hypoxia Active Problems:  Microcytic anemia  Obesity (BMI 30.0-34.9)  ARDS (adult respiratory distress syndrome)  Steroid-induced hyperglycemia  Acute renal insufficiency  Influenza A   ASSESSMENT / PLAN:  PULMONARY  ASSESSMENT: Acute respiratory failure with ARDS 2nd to Influenza pneumonia. Respiratory mechanics improved with diuresis on 04/02/12 12/18 rapid shallow breathing on 5/5, oxygenation good  PLAN:   Start precedex now to help calm her down to better assess vent mechanics on SBT, could likely be extubated in next 24 hours Continue diuresis Full vent support for now D/c prednisone   CARDIOVASCULAR  Lab 04/06/12 0423  PROBNP 420.9*    ASSESSMENT:  Septic shock 2nd to PNA. Resolved.  On 04/06/12: Not on pressors.    PLAN:  Lopressor prn for SBP > 170, HR > 120 Lasix x2 12/18  RENAL  Lab 04/06/12 0423 04/05/12 0355 04/04/12 0515 04/03/12 0509 04/02/12 0502  NA 154* 152* 155* 147* 144  K 3.3* 3.6 -- -- --  CL 118* 114* 118* 109 109  CO2 27 29 30 30 27   GLUCOSE 116* 128* 115* 100* 99  BUN 53* 65* 70* 64* 63*  CREATININE 1.23* 1.47* 1.65* 1.59* 1.57*  CALCIUM 8.9 8.9 8.9 8.8 9.0  MG 2.1 -- -- 2.3 --  PHOS 3.3 -- -- 4.2 --    ASSESSMENT:   Acute renal failure improving Hypernatremia related to diuresis but still volume up    PLAN:   Use gut for free water >> double free water  GASTROINTESTINAL  ASSESSMENT:  Nutrition. Diarrhea. - C Diff neg, Likely tube feed related. PPI changed to pepcid 04/02/12 PLAN:   Continue tube feeds  Continue flexiseal Nutrition consult   HEMATOLOGIC  Lab 04/06/12 0423 04/05/12 0355 04/04/12 0515  HGB 7.6* 7.9* 7.5*  HCT 25.8* 27.1* 25.0*  WBC 11.1* 13.9* 10.1  PLT 398 412* 313    ASSESSMENT:   Iron deficiency anemia.  No evidence for bleeding. Marland Kitchen PLAN:  Continue FeSO4 F/u CBC Transfuse for Hb < 7 Continue SQ heparin for DVT  prophylaxis since 12/14  INFECTIOUS No results found for this basename: PROCALCITON:5 in the last 168 hours  ASSESSMENT:   Influenza A pneumonia. S/p tamiflu Rx ending 12/15  PLAN:   Monitor off tamiflu   ENDOCRINE  ASSESSMENT:   Steroid induced hyperglycemia.   PLAN:   D/c prednisone  NEUROLOGIC  ASSESSMENT:   At risk for critical illness polyneuropathy (Was on paralytic, and getting steroids).  On 12/18: anxiety, agitation continues  PLAN:   scheduled haldol 04/05/12 (qtc ) Prn versed Fentanyl gtt precedex 12/18    GLOBAL  - no family at bedside 04/06/12  The patient is critically ill with multiple organ systems failure and requires high complexity decision making for assessment and support, frequent evaluation and titration of therapies, application of advanced monitoring technologies and extensive interpretation of multiple databases.   Critical Care Time devoted to patient care services described in this note is  40  Minutes.  Yolonda Kida PCCM Pager: [phone] Cell: [phone] If no response, call [phone]   04/06/2012 9:06 AM

## 2012-04-07 ENCOUNTER — Inpatient Hospital Stay (HOSPITAL_COMMUNITY): Payer: MEDICAID

## 2012-04-07 LAB — BLOOD GAS, ARTERIAL
Acid-Base Excess: 1.9 mmol/L (ref 0.0–2.0)
Bicarbonate: 25.6 mEq/L — ABNORMAL HIGH (ref 20.0–24.0)
Drawn by: 347641
FIO2: 40 %
O2 Saturation: 93.9 %
PEEP: 5 cmH2O
Patient temperature: 100.5
RATE: 14 resp/min

## 2012-04-07 LAB — BASIC METABOLIC PANEL
BUN: 59 mg/dL — ABNORMAL HIGH (ref 6–23)
Chloride: 114 mEq/L — ABNORMAL HIGH (ref 96–112)
GFR calc Af Amer: 42 mL/min — ABNORMAL LOW (ref >90)
Potassium: 3.3 mEq/L — ABNORMAL LOW (ref 3.5–5.1)
Sodium: 150 mEq/L — ABNORMAL HIGH (ref 135–145)

## 2012-04-07 LAB — CBC WITH DIFFERENTIAL/PLATELET
Basophils Relative: 0 % (ref 0–1)
Eosinophils Relative: 1 % (ref 0–5)
HCT: 24.9 % — ABNORMAL LOW (ref 36.0–46.0)
Hemoglobin: 7.4 g/dL — ABNORMAL LOW (ref 12.0–15.0)
Lymphocytes Relative: 10 % — ABNORMAL LOW (ref 12–46)
Monocytes Relative: 8 % (ref 3–12)
Neutro Abs: 9.1 10*3/uL — ABNORMAL HIGH (ref 1.7–7.7)
Neutrophils Relative %: 81 % — ABNORMAL HIGH (ref 43–77)
RBC: 3.59 MIL/uL — ABNORMAL LOW (ref 3.87–5.11)
WBC: 11.2 10*3/uL — ABNORMAL HIGH (ref 4.0–10.5)

## 2012-04-07 MED ORDER — FENTANYL BOLUS VIA INFUSION
25.0000 ug | Freq: Four times a day (QID) | INTRAVENOUS | Status: DC | PRN
  Filled 2012-04-07: qty 100

## 2012-04-07 MED ORDER — MIDAZOLAM HCL 2 MG/2ML IJ SOLN
INTRAMUSCULAR | Status: AC
  Filled 2012-04-07: qty 4

## 2012-04-07 MED ORDER — METRONIDAZOLE 500 MG PO TABS
500.0000 mg | ORAL_TABLET | Freq: Three times a day (TID) | ORAL | Status: DC
  Administered 2012-04-07 – 2012-04-08 (×3): 500 mg via ORAL
  Filled 2012-04-07 (×7): qty 1

## 2012-04-07 MED ORDER — HALOPERIDOL LACTATE 5 MG/ML IJ SOLN
2.0000 mg | Freq: Four times a day (QID) | INTRAMUSCULAR | Status: DC
  Administered 2012-04-07 – 2012-04-09 (×5): 2 mg via INTRAVENOUS
  Filled 2012-04-07 (×2): qty 0.4
  Filled 2012-04-07: qty 1
  Filled 2012-04-07 (×5): qty 0.4
  Filled 2012-04-07: qty 1
  Filled 2012-04-07: qty 0.4

## 2012-04-07 MED ORDER — MIDAZOLAM HCL 2 MG/2ML IJ SOLN
2.0000 mg | Freq: Once | INTRAMUSCULAR | Status: AC
  Administered 2012-04-07: 2 mg via INTRAVENOUS

## 2012-04-07 MED ORDER — POTASSIUM CHLORIDE 20 MEQ/15ML (10%) PO LIQD
ORAL | Status: AC
  Filled 2012-04-07: qty 30

## 2012-04-07 MED ORDER — FUROSEMIDE 10 MG/ML IJ SOLN
INTRAMUSCULAR | Status: AC
  Filled 2012-04-07: qty 4

## 2012-04-07 MED ORDER — PROPOFOL 10 MG/ML IV EMUL
5.0000 ug/kg/min | INTRAVENOUS | Status: DC
  Administered 2012-04-07: 20 ug/kg/min via INTRAVENOUS
  Administered 2012-04-07: 10 ug/kg/min via INTRAVENOUS
  Administered 2012-04-08: 15 ug/kg/min via INTRAVENOUS
  Filled 2012-04-07 (×2): qty 100

## 2012-04-07 MED ORDER — FUROSEMIDE 10 MG/ML IJ SOLN
40.0000 mg | Freq: Once | INTRAMUSCULAR | Status: AC
  Administered 2012-04-07: 40 mg via INTRAVENOUS

## 2012-04-07 MED ORDER — VITAL AF 1.2 CAL PO LIQD
1000.0000 mL | ORAL | Status: DC
  Administered 2012-04-07 – 2012-04-08 (×2): 1000 mL
  Filled 2012-04-07 (×3): qty 1000

## 2012-04-07 MED ORDER — PROPOFOL 10 MG/ML IV EMUL
INTRAVENOUS | Status: AC
  Filled 2012-04-07: qty 100

## 2012-04-07 MED ORDER — POTASSIUM CHLORIDE 20 MEQ/15ML (10%) PO LIQD
40.0000 meq | Freq: Once | ORAL | Status: AC
  Administered 2012-04-07: 40 meq
  Filled 2012-04-07: qty 30

## 2012-04-07 MED ORDER — FREE WATER
200.0000 mL | Freq: Four times a day (QID) | Status: DC
  Administered 2012-04-08 (×2): 200 mL

## 2012-04-07 MED ORDER — ETOMIDATE 2 MG/ML IV SOLN
10.0000 mg | Freq: Once | INTRAVENOUS | Status: AC
  Administered 2012-04-07: 10 mg via INTRAVENOUS

## 2012-04-07 MED ORDER — ROCURONIUM BROMIDE 50 MG/5ML IV SOLN
100.0000 mg | Freq: Once | INTRAVENOUS | Status: AC
  Administered 2012-04-07: 100 mg via INTRAVENOUS

## 2012-04-07 MED ORDER — SODIUM CHLORIDE 0.9 % IV SOLN
25.0000 ug/h | INTRAVENOUS | Status: DC
  Administered 2012-04-07: 100 ug/h via INTRAVENOUS
  Administered 2012-04-07: 120 ug/h via INTRAVENOUS
  Administered 2012-04-08: 200 ug/h via INTRAVENOUS
  Filled 2012-04-07 (×2): qty 50

## 2012-04-07 MED ORDER — PRO-STAT SUGAR FREE PO LIQD
60.0000 mL | Freq: Three times a day (TID) | ORAL | Status: DC
  Administered 2012-04-07 – 2012-04-08 (×4): 60 mL
  Filled 2012-04-07 (×7): qty 60

## 2012-04-07 MED ORDER — FENTANYL CITRATE 0.05 MG/ML IJ SOLN
INTRAMUSCULAR | Status: AC
  Filled 2012-04-07: qty 4

## 2012-04-07 MED ORDER — FENTANYL CITRATE 0.05 MG/ML IJ SOLN
200.0000 ug | Freq: Once | INTRAMUSCULAR | Status: AC
  Administered 2012-04-07: 200 ug via INTRAVENOUS

## 2012-04-07 NOTE — Progress Notes (Signed)
ETT pulled back 4 cm per Md order

## 2012-04-07 NOTE — Progress Notes (Signed)
PULMONARY  / CRITICAL CARE MEDICINE  Name: Megan Espinoza MRN: [id number] DOB: Sep 16, 1955    LOS: 14 Date of admit: 03/24/2012  7:37 PM   REFERRING MD :  Elizabeth Palau  CHIEF COMPLAINT:  Respiratory failure  BRIEF PATIENT DESCRIPTION:  56 yo female admitted 03/24/2012 fever, dry cough, chills, nausea 2nd to PNA complicated by anemia.  Developed worsening hypoxia 12/08 from ARDS and transfer to ICU with PCCM assuming care.  On 12/09 developed VDRF due to ARDS.  Found to have influenza A positive.   LEVEL OF CARE:  ICU PRIMARY SERVICE:  PCCM CONSULTANTS:  None CODE STATUS DIET:  Tube feeds DVT Px:  SQ heparin GI Px:  Pepcid    LINES / TUBES: ETT 12/09>> Rt [place] CVL 12/09>>  CULTURES: MRSA screen 12/5 >>negative Blood 12/5 >>negative Urine (clean catch) 12/5 >> diphtheroids (deemed contaminant)  Legionella Ag 12/09 >>negative Strep Ag 12/09 >>negtive Pneumocystis DFA 12/09 >>negative AFB smear 12/09 >>negative Fungal smear/cx 12/09 >>negative Legionella cx 12/09 >>negative resp virus panel 12/09 >> Influenza A POSITIVE  Resp 12/09 >>negative C diff PCR 12/14 >> negative C Diff PCR 12/19 > (repeat) >>  ANTIBIOTICS: Aztreonam 12/5 >> 12/11  oseltamivir 12/08 >> 12/10 , 12/11 >>12/15 TMP/SMX 12/09 >> 12/10  Levofloxacin 12/5 >> 12/12  vanco 12/5 >> 12/12  ............ Po flagyl 12/29 (empiric c diff) >>    TESTS: 12/06 CT chest >>diffuse b/l patchy ASD with air bronchograms 12/09 Bronchoscopy>>minimal secretions, normal airways, blood tinged BAL 12/09 RF, Ant-DS DNA, SCL-70 Ab, HIV negative/non-reactive    SIGNIFICANT EVENTS:  12/08 Transfer to ICU 12/09 VDRF, ARDS, nimbex started, steroids started 12/10 Transfuse PRBC, Pressors started 12/11 Nimbex stopped 12/17 - scheduled haldol strted 04/06/12: agitated on wua, very shallow breathing on 5/5. Scheduled precedx gtt    SUBJECTIVE/OVERNIGHT/INTERVAL HX 04/07/12: weaning well. AWake.  Calm. Indicates she wants to be extubated. Having massive foul smelling diarrhea - resutling in stage 1 excoriation in perineum.   VITAL SIGNS: Temp:  [99.5 F (37.5 C)-100.9 F (38.3 C)] 100 F (37.8 C) (12/19 0800) Pulse Rate:  [65-128] 89  (12/19 1000) Resp:  [23-35] 28  (12/19 1000) BP: (96-144)/(35-75) 137/70 mmHg (12/19 1000) SpO2:  [92 %-100 %] 97 % (12/19 1000) FiO2 (%):  [39.7 %-40.3 %] 39.7 % (12/19 1000) HEMODYNAMICS:   VENTILATOR SETTINGS: Vent Mode:  [-] CPAP FiO2 (%):  [39.7 %-40.3 %] 39.7 % Set Rate:  [14 bmp] 14 bmp Vt Set:  [460 mL] 460 mL PEEP:  [5 cmH20] 5 cmH20 Pressure Support:  [10 cmH20] 10 cmH20 Plateau Pressure:  [19 cmH20-24 cmH20] 20 cmH20 INTAKE / OUTPUT: Intake/Output      12/18 0701 - 12/19 0700 12/19 0701 - 12/20 0700   I.V. (mL/kg) 568.3 (4.7) 36.6 (0.3)   NG/GT 3220 340   IV Piggyback 4    Total Intake(mL/kg) 3792.3 (31.1) 376.6 (3.1)   Urine (mL/kg/hr) 2165 (0.7) 325   Stool     Total Output 2165 325   Net +1627.3 +51.6          PHYSICAL EXAMINATION:  Gen: awake on vent HEENT: NCAT, PERRL, ETT in place PULM: lungs with few crackles in bases CV: RRR no mgr Ab: BS+, soft, nontender Ext: trace edema Neuro: Awake, alert,RASS0 on precedex gtt and scheduled haldol. CAM-ICU neg for delirium LABS: Cbc  Lab 04/07/12 0510 04/06/12 0423 04/05/12 0355  WBC 11.2* -- --  HGB 7.4* 7.6* 7.9*  HCT 24.9* 25.8* 27.1*  PLT 312 398 412*    Chemistry   Lab 04/07/12 0510 04/06/12 0423 04/05/12 0355 04/03/12 0509  NA 150* 154* 152* --  K 3.3* 3.3* 3.6 --  CL 114* 118* 114* --  CO2 27 27 29  --  BUN 59* 53* 65* --  CREATININE 1.54* 1.23* 1.47* --  CALCIUM 8.5 8.9 8.9 --  MG -- 2.1 -- 2.3  PHOS -- 3.3 -- 4.2  GLUCOSE 140* 116* 128* --    Liver fxn No results found for this basename: AST:3,ALT:3,ALKPHOS:3,BILITOT:3,PROT:3,ALBUMIN:3 in the last 168 hours coags No results found for this basename: APTT:3,INR:3 in the last 168 hours Sepsis  markers No results found for this basename: LATICACIDVEN:3,PROCALCITON:3 in the last 168 hours Cardiac markers No results found for this basename: CKTOTAL:3,CKMB:3,TROPONINI:3 in the last 168 hours BNP  Lab 04/06/12 0423  PROBNP 420.9*   ABG  Lab 04/07/12 0500 04/02/12 1133  PHART 7.431 7.394  PCO2ART 39.7 47.2*  PO2ART 71.8* 87.4  HCO3 25.6* 28.1*  TCO2 26.8 29.5    CBG trend  Lab 04/04/12 0830 04/03/12 0806 04/03/12 0343 04/02/12 2328 04/02/12 1946  GLUCAP 121* 116* 80 91 107*    IMAGING: Dg Chest Port 1 View  04/07/2012  *RADIOLOGY REPORT*  Clinical Data: Evaluate endotracheal tube position  PORTABLE CHEST - 1 VIEW  Comparison: 04/06/2012  Findings: The endotracheal tube has been further inserted.  The tip lies 1 cm above the carina, well positioned.  Patchy bilateral airspace opacities are unchanged from the previous day's study.  No pneumothorax.  The right subclavian central venous line and the nasogastric tube are both stable well-positioned.  IMPRESSION: Endotracheal tube now has its tip 1 cm above the carina.  Stable patchy bilateral airspace lung opacities.   Original Report Authenticated By: Amie Portland, M.D.    Dg Chest Port 1 View  04/06/2012  *RADIOLOGY REPORT*  Clinical Data: Evaluate endotracheal tube  PORTABLE CHEST - 1 VIEW  Comparison: 04/05/2012; 04/04/2012; 04/03/2012  Findings: Grossly unchanged cardiac silhouette and mediastinal contours.  Interval retraction of endotracheal tube with tip now approximately 4.9 cm above the carina.  Otherwise, stable positioning of remaining support apparatus.  Lung volumes remain persistently reduced.  Minimally improved aeration of the right lower lung with persistent bibasilar heterogeneous air space opacities, left greater than right.  Trace bilateral effusions are likely unchanged. No pneumothorax.  Unchanged bones.  IMPRESSION: 1.  Interval retraction of endotracheal tube with tip now approximately 4.9 cm above the carina.   Otherwise, stable position support apparatus.  No pneumothorax. 2.  Minimal improved right lower lobe atelectasis with persistent bibasilar predominant airspace opacities worrisome for multifocal infection.   Original Report Authenticated By: Tacey Ruiz, MD      DIAGNOSES: Principal Problem:  *Acute respiratory failure with hypoxia Active Problems:  Microcytic anemia  Obesity (BMI 30.0-34.9)  ARDS (adult respiratory distress syndrome)  Steroid-induced hyperglycemia  Acute renal insufficiency  Influenza A   ASSESSMENT / PLAN:  PULMONARY  ASSESSMENT: Acute respiratory failure with ARDS 2nd to Influenza pneumonia. Respiratory mechanics improved with diuresis on 04/02/12  04/07/12: With precedex siince 12/18 meets extubation criteria  PLAN:   EXTUBATE 12/19 under cover of one dose lasix   CARDIOVASCULAR  Lab 04/06/12 0423  PROBNP 420.9*    ASSESSMENT:  Septic shock 2nd to PNA. Resolved.  On 04/06/12 and 04/07/12: Not on pressors.    PLAN:  Lopressor prn for SBP > 170, HR > 120 Lasix x2 12/18 Lasix x 1 on 12/19  RENAL  Lab 04/07/12 0510 04/06/12 0423 04/05/12 0355 04/04/12 0515 04/03/12 0509  NA 150* 154* 152* 155* 147*  K 3.3* 3.3* -- -- --  CL 114* 118* 114* 118* 109  CO2 27 27 29 30 30   GLUCOSE 140* 116* 128* 115* 100*  BUN 59* 53* 65* 70* 64*  CREATININE 1.54* 1.23* 1.47* 1.65* 1.59*  CALCIUM 8.5 8.9 8.9 8.9 8.8  MG -- 2.1 -- -- 2.3  PHOS -- 3.3 -- -- 4.2    ASSESSMENT:   Acute renal failure improving Hypernatremia related to diuresis but still volume up    PLAN:   Use gut for free water >> double free water  GASTROINTESTINAL  ASSESSMENT:  Nutrition. Diarrhea. - C Diff neg, Likely tube feed related. PPI changed to pepcid 04/02/12  On 12/19: massive diarreha continues that is foul smelling  PLAN:   DC tube feeds Check c diff pcr ; start empiric fgalgyl    HEMATOLOGIC  Lab 04/07/12 0510 04/06/12 0423 04/05/12 0355  HGB 7.4* 7.6*  7.9*  HCT 24.9* 25.8* 27.1*  WBC 11.2* 11.1* 13.9*  PLT 312 398 412*    ASSESSMENT:   Iron deficiency anemia.  No evidence for bleeding. Marland Kitchen PLAN:  Continue FeSO4 F/u CBC Transfuse for Hb < 7 Continue SQ heparin for DVT prophylaxis since 12/14  INFECTIOUS No results found for this basename: PROCALCITON:5 in the last 168 hours No results found for this basename: PROCALCITON:5 in the last 168 hours  ASSESSMENT:   Influenza A pneumonia. S/p tamiflu Rx ending 12/15  PLAN:   Monitor off tamiflu   ENDOCRINE  ASSESSMENT:   Steroid induced hyperglycemia.   PLAN:   D/c prednisone (done 12/18)  NEUROLOGIC  ASSESSMENT:   At risk for critical illness polyneuropathy (Was on paralytic, and getting steroids).  On 12/18: anxiety, agitation continues On 12/19: CAM-iCU neg for delirium with precedex  PLAN:   scheduled haldol 04/05/12 (qtc ) but will reduce dose 12/19 Dc fent gtt and versed prn precedex 12/18  DERM A: sTAGE 1 red rash in perineum P - wound care consult  GLOBAL  - no family at bedside 04/06/12   - 04/07/12: spoke to husband: [phone]. Updated. Patient son has terminal cancer and patient needs to talk to son immediately after extubation. Husband says emotional support to be provided during the conversation.   The patient is critically ill with multiple organ systems failure and requires high complexity decision making for assessment and support, frequent evaluation and titration of therapies, application of advanced monitoring technologies and extensive interpretation of multiple databases.   Critical Care Time devoted to patient care services described in this note is  40  Minutes.  [facility] PCCM Pager: [phone] Cell: [phone] If no response, call [phone]   04/07/2012 10:54 AM

## 2012-04-07 NOTE — Progress Notes (Signed)
Pt. Decided that she did want to be reintubated. Family at bedside.

## 2012-04-07 NOTE — Progress Notes (Signed)
Called by Rn. I had left for AM  Patient not doing well and in resp distress post extubation Per RN, patient requesting DNR  Plan  - I spoke to husband and said that despite best assessment extubation failed and she requires reintubation. He is oky with reintubation. I told him patient refusing. He is going to have patient daughter speak to patient.   - I have asked RN to restart precedex    - I have informed and update DR Molli Knock PCCM cross cover who will be available to intubate patient. Per him, patient was refusing intubation   Dr. Kalman Shan, M.D., [facility].C.P Pulmonary and Critical Care Medicine Staff Physician [facility] [place] [facility] Pager: [phone], If no answer or between  15:00h - 7:00h: call [phone]  04/07/2012 1:10 PM

## 2012-04-07 NOTE — Progress Notes (Signed)
Pt. Extubated at 1215. Pt. HR has increased and Respiratory rate is in the 30s and 40s. Dr. Marchelle Gearing was made aware and sent Dr. Molli Knock to see the patient.Dr. Molli Knock came to visit the patient and she expressed that she did not want to be reintubated. I have spoken with Dr. Marchelle Gearing, he called the boyfriend and the boyfriend asked that we call her daughter. Dr. Marchelle Gearing asked me to call her daughter and restart the precedex and to call Dr. Molli Knock once I speak to the daughter. I called the daughter who states she wants everything done and will be at the hospital within an hour. Dr. Molli Knock notified. He stated to not restart the precedex and to continue to monitor the patient.

## 2012-04-07 NOTE — Consult Note (Signed)
WOC consult Note Reason for Consult: skin excoriation in the perineum.  Pt having lots of diarrhea with flexiseal in place but leaking at times and had come out previously. Pt presents with MASD due to exposure to diarrhea. Bedside nursing has proceeded with implementation of the skin care protocols (perineal cleanser and use of zinc based barrier) Wound type:MASD (moisture associated skin damage) due to exposure to diarrhea. Some partial thickness skin loss (peeling of the inner thighs,due to the exposure) Dressing procedure/placement/frequency:no dressings will really manage this problem, standard of care would be the use of zinc based barrier, thick layers applied frequently, after episode of incontinence clean away stool and reapply thick layer of the zinc.    Re consult if needed, will not follow at this time. Thanks  Liahm Grivas [employer], CWOCN [phone])

## 2012-04-07 NOTE — Procedures (Signed)
Intubation Procedure Note Megan Espinoza [id number] 04/26/1955  Procedure: Intubation Indications: Respiratory insufficiency  Procedure Details Consent: Risks of procedure as well as the alternatives and risks of each were explained to the (patient/caregiver).  Consent for procedure obtained. Time Out: Verified patient identification, verified procedure, site/side was marked, verified correct patient position, special equipment/implants available, medications/allergies/relevent history reviewed, required imaging and test results available.  Performed  Maximum sterile technique was used including cap, gloves, gown, hand hygiene and mask.  MAC and 4    Evaluation Hemodynamic Status: BP stable throughout; O2 sats: transiently fell during during procedure Patient's Current Condition: stable Complications: No apparent complications Patient did tolerate procedure well. Chest X-ray ordered to verify placement.  CXR: pending.   Procedure performed under direct supervision of Dr. Molli Knock and with glide-scope.   Difficult airway - small mouth, large teeth, short neck / obese.  Tube resistance when attempting to pass cords - had to pass bougie to pass ETT with assistance of visualization with glide-scope.     Megan Brim, NP-C [facility] Pgr: [phone] or [phone]  Patient seen and examined, agree with above note.  I dictated the care and orders written for this patient under my direction.  Koren Bound, M.D. [phone]

## 2012-04-07 NOTE — Procedures (Signed)
Extubation Procedure Note  Patient Details:   Name: Megan Espinoza DOB: September 20, 1955 MRN: [id number]   Airway Documentation:     Evaluation  O2 sats: transiently fell during during procedure Complications: Complications of inceeased resp rate and low sat Patient did not tolerate procedure well. Bilateral Breath Sounds: Expiratory wheezes Suctioning: Airway Yes Pt awake and alert. Extubated per MD order to 6 L [place]. Sat 92%. BBS rh, wh. Positive cuff leak. Pt able to vocalize. Pt very tenuious at tis time, RR 41. Prt receiving albuterol treatment now.  Arloa Koh 04/07/2012, 12:33 PM

## 2012-04-07 NOTE — Progress Notes (Signed)
Pt. HR is in the 120-130s. Elink MD called regarding sedation. Propofol ordered. Pt. Urine is a brownish in color. MD made aware. Will continue to monitor.

## 2012-04-08 ENCOUNTER — Inpatient Hospital Stay (HOSPITAL_COMMUNITY): Payer: MEDICAID

## 2012-04-08 LAB — CBC
HCT: 24.5 % — ABNORMAL LOW (ref 36.0–46.0)
HCT: 26.4 % — ABNORMAL LOW (ref 36.0–46.0)
Hemoglobin: 7.1 g/dL — ABNORMAL LOW (ref 12.0–15.0)
MCH: 20.6 pg — ABNORMAL LOW (ref 26.0–34.0)
MCH: 20.6 pg — ABNORMAL LOW (ref 26.0–34.0)
MCV: 69.8 fL — ABNORMAL LOW (ref 78.0–100.0)
Platelets: 341 10*3/uL (ref 150–400)
RBC: 3.45 MIL/uL — ABNORMAL LOW (ref 3.87–5.11)
RBC: 3.78 MIL/uL — ABNORMAL LOW (ref 3.87–5.11)

## 2012-04-08 LAB — BASIC METABOLIC PANEL
BUN: 65 mg/dL — ABNORMAL HIGH (ref 6–23)
CO2: 26 mEq/L (ref 19–32)
Chloride: 117 mEq/L — ABNORMAL HIGH (ref 96–112)
Creatinine, Ser: 1.69 mg/dL — ABNORMAL HIGH (ref 0.50–1.10)
Glucose, Bld: 110 mg/dL — ABNORMAL HIGH (ref 70–99)

## 2012-04-08 LAB — MAGNESIUM: Magnesium: 2.1 mg/dL (ref 1.5–2.5)

## 2012-04-08 MED ORDER — DEXMEDETOMIDINE HCL IN NACL 200 MCG/50ML IV SOLN
0.2000 ug/kg/h | INTRAVENOUS | Status: DC
  Administered 2012-04-08 – 2012-04-10 (×10): 0.4 ug/kg/h via INTRAVENOUS
  Filled 2012-04-08 (×12): qty 50

## 2012-04-08 MED ORDER — FREE WATER
300.0000 mL | Status: DC
  Administered 2012-04-08 – 2012-04-09 (×6): 300 mL

## 2012-04-08 MED ORDER — DIPHENHYDRAMINE HCL 25 MG PO CAPS
25.0000 mg | ORAL_CAPSULE | Freq: Four times a day (QID) | ORAL | Status: DC | PRN
  Administered 2012-04-08: 25 mg via ORAL
  Filled 2012-04-08: qty 1

## 2012-04-08 NOTE — Progress Notes (Signed)
SLP Cancellation Note  Patient Details Name: Megan Espinoza MRN: [id number] DOB: 10-May-1955   Cancelled treatment:       Reason Eval/Treat Not Completed: Medical issues which prohibited therapy (Pt. reintubated. Signing off. Please reconsult when ready. )  Ferdinand Lango MA, CCC-SLP [phone]  [name] 04/08/2012, 8:16 AM

## 2012-04-08 NOTE — Progress Notes (Signed)
eLink Physician-Brief Progress Note Patient Name: Megan Espinoza DOB: 03-01-1956 MRN: [id number]  Date of Service  04/08/2012   HPI/Events of Note  Call from nurse earlier with report of 30 cc UOP since 7pm and MAPs 70s to 80s.  Received sedation bolus with MAPs in the 50s.  eICU Interventions  Plan: Monitor CVP for now - if WNL will monitor prn Bladder scan to evaluate for any urinary retention   Intervention Category Intermediate Interventions: Hypotension - evaluation and management  DETERDING,ELIZABETH 04/08/2012, 12:55 AM

## 2012-04-08 NOTE — Consult Note (Signed)
WOC consult Note Reason for Consult:Consult placed by MD for assessment of perineum and skin breakdown due to moisture (i.e., MASD, specifically incontinence associated dermatitis).  Patient seen yesterday by my partner, M. Austin.  There is no change in her condition since the time of that consult and no new orders are indicated.   Our team will not follow.  Please re-consult if needed. Thanks, Ladona Mow, MSN, RN, [facility], CWOCN [phone])

## 2012-04-08 NOTE — Progress Notes (Signed)
PT Cancellation Note  Patient Details Name: Megan Espinoza MRN: [id number] DOB: 01-Jun-1955   Cancelled Treatment:    Reason Eval/Treat Not Completed: Patient not medically ready (Pt reintubated at this time.) Will continue to follow.  Shelbe Haglund 04/08/2012, 2:43 PM Jake Shark, PT DPT [phone]

## 2012-04-08 NOTE — Progress Notes (Signed)
eLink Physician-Brief Progress Note Patient Name: ZARAYAH LANTING DOB: 1956/03/20 MRN: [id number]  Date of Service  04/08/2012   HPI/Events of Note  Bladder scan revealed greater than 700 cc in bladder with foley intact.  Foley changed out with 500 cc UOP.  HD stable with BP improved.   eICU Interventions  Plan: D/C CVP monitoring   Intervention Category Minor Interventions: Other:  DETERDING,ELIZABETH 04/08/2012, 1:50 AM

## 2012-04-08 NOTE — Progress Notes (Signed)
PULMONARY  / CRITICAL CARE MEDICINE  Name: Megan Espinoza MRN: [id number] DOB: 11-Sep-1955    LOS: 15 Date of admit: 03/24/2012  7:37 PM   REFERRING MD :  Elizabeth Palau  CHIEF COMPLAINT:  Respiratory failure  BRIEF PATIENT DESCRIPTION:  56 yo female admitted 03/24/2012 fever, dry cough, chills, nausea 2nd to PNA complicated by anemia.  Developed worsening hypoxia 12/08 from ARDS and transfer to ICU with PCCM assuming care.  On 12/09 developed VDRF due to ARDS.  Found to have influenza A positive.   LEVEL OF CARE:  ICU PRIMARY SERVICE:  PCCM CONSULTANTS:  None CODE STATUS DIET:  Tube feeds DVT Px:  SQ heparin GI Px:  Pepcid    LINES / TUBES: ETT 12/09>>12/19, 12/19 (resp distress post extubation) >> Rt [place] CVL 12/09>>  CULTURES: MRSA screen 12/5 >>negative Blood 12/5 >>negative Urine (clean catch) 12/5 >> diphtheroids (deemed contaminant)  Legionella Ag 12/09 >>negative Strep Ag 12/09 >>negtive Pneumocystis DFA 12/09 >>negative AFB smear 12/09 >>negative Fungal smear/cx 12/09 >>negative Legionella cx 12/09 >>negative resp virus panel 12/09 >> Influenza A POSITIVE  Resp 12/09 >>negative C diff PCR 12/14 >> negative C Diff PCR 12/19 > (repeat) >> NEGATIVE      ANTIBIOTICS: Aztreonam 12/5 >> 12/11  oseltamivir 12/08 >> 12/10 , 12/11 >>12/15 TMP/SMX 12/09 >> 12/10  Levofloxacin 12/5 >> 12/12  vanco 12/5 >> 12/12  ............ Po flagyl 12/29 (empiric c diff) >>12/20    TESTS: 12/06 CT chest >>diffuse b/l patchy ASD with air bronchograms 12/09 Bronchoscopy>>minimal secretions, normal airways, blood tinged BAL 12/09 RF, Ant-DS DNA, SCL-70 Ab, HIV negative/non-reactive    SIGNIFICANT EVENTS:  12/08 Transfer to ICU 12/09 VDRF, ARDS, nimbex started, steroids started 12/10 Transfuse PRBC, Pressors started 12/11 Nimbex stopped 12/17 - scheduled haldol strted 04/06/12: agitated on wua, very shallow breathing on 5/5.  Rx precedx gtt 04/07/12:   SEVERE DIARRHEA. EXTUBATED and REINTUBATED promptly (she wanted DNAR and DNAI but daughter felt otherwise)   SUBJECTIVE/OVERNIGHT/INTERVAL HX 04/08/12: Normal WUA on fent and diprivan gtt. C Diff negative. Diarrhea better. Doing PSV  VITAL SIGNS: Temp:  [97.6 F (36.4 C)-99.4 F (37.4 C)] 97.6 F (36.4 C) (12/20 0834) Pulse Rate:  [80-150] 88  (12/20 0900) Resp:  [14-39] 21  (12/20 0900) BP: (95-188)/(39-89) 118/58 mmHg (12/20 0900) SpO2:  [92 %-100 %] 98 % (12/20 0900) FiO2 (%):  [39.7 %-60.1 %] 40.1 % (12/20 0900) HEMODYNAMICS:   VENTILATOR SETTINGS: Vent Mode:  [-] PSV;CPAP FiO2 (%):  [39.7 %-60.1 %] 40.1 % Set Rate:  [14 bmp] 14 bmp Vt Set:  [460 mL] 460 mL PEEP:  [4.8 cmH20-5 cmH20] 5 cmH20 Pressure Support:  [10 cmH20] 10 cmH20 Plateau Pressure:  [20 cmH20-22 cmH20] 20 cmH20 INTAKE / OUTPUT: Intake/Output      12/19 0701 - 12/20 0700 12/20 0701 - 12/21 0700   I.V. (mL/kg) 429.1 (3.5) 51.5 (0.4)   Other 200    NG/GT 550 90   IV Piggyback     Total Intake(mL/kg) 1179.1 (9.7) 141.5 (1.2)   Urine (mL/kg/hr) 1445 (0.5) 185   Total Output 1445 185   Net -265.9 -43.5        Urine Occurrence 1 x      PHYSICAL EXAMINATION: Gen: awake on vent HEENT: NCAT, PERRL, ETT in place PULM: lungs with few crackles in bases CV: RRR no mgr Ab: BS+, soft, nontender Ext: trace edema Neuro: Awake, alert,RASS0 on diprivan gtt and fent gtt and scheduled haldol. CAM-ICU neg  for delirium LABS: Cbc  Lab 04/08/12 0002 04/07/12 0510 04/06/12 0423  WBC 13.7* -- --  HGB 7.8* 7.4* 7.6*  HCT 26.4* 24.9* 25.8*  PLT 341 312 398    Chemistry   Lab 04/08/12 0530 04/07/12 0510 04/06/12 0423 04/03/12 0509  NA 154* 150* 154* --  K 3.4* 3.3* 3.3* --  CL 117* 114* 118* --  CO2 26 27 27  --  BUN 65* 59* 53* --  CREATININE 1.69* 1.54* 1.23* --  CALCIUM 8.8 8.5 8.9 --  MG 2.1 -- 2.1 2.3  PHOS 4.6 -- 3.3 4.2  GLUCOSE 110* 140* 116* --    Liver fxn No results found for this basename:  AST:3,ALT:3,ALKPHOS:3,BILITOT:3,PROT:3,ALBUMIN:3 in the last 168 hours coags No results found for this basename: APTT:3,INR:3 in the last 168 hours Sepsis markers No results found for this basename: LATICACIDVEN:3,PROCALCITON:3 in the last 168 hours Cardiac markers No results found for this basename: CKTOTAL:3,CKMB:3,TROPONINI:3 in the last 168 hours BNP  Lab 04/06/12 0423  PROBNP 420.9*   ABG  Lab 04/07/12 0500 04/02/12 1133  PHART 7.431 7.394  PCO2ART 39.7 47.2*  PO2ART 71.8* 87.4  HCO3 25.6* 28.1*  TCO2 26.8 29.5    CBG trend  Lab 04/04/12 0830 04/03/12 0806 04/03/12 0343 04/02/12 2328 04/02/12 1946  GLUCAP 121* 116* 80 91 107*    IMAGING: Dg Chest Port 1 View  04/07/2012  *RADIOLOGY REPORT*  Clinical Data: Endotracheal tube placement  PORTABLE CHEST - 1 VIEW  Comparison: 04/07/2012  Findings: Endotracheal tube has been advanced and is now in the right main bronchus.  Withdraw the tube approximately 5 cm.  Bilateral airspace disease is unchanged from earlier today.  This may be edema or pneumonia.  Right-sided central venous catheter tip in the SVC is unchanged.  No pneumothorax.  IMPRESSION: Endotracheal tube in the right bronchus, withdraw 5 cm.  Diffuse bilateral airspace disease is unchanged.  I discussed the findings with Aundra Millet, the patient's nurse   Original Report Authenticated By: Janeece Riggers, M.D.    Dg Chest Port 1 View  04/07/2012  *RADIOLOGY REPORT*  Clinical Data: Evaluate endotracheal tube position  PORTABLE CHEST - 1 VIEW  Comparison: 04/06/2012  Findings: The endotracheal tube has been further inserted.  The tip lies 1 cm above the carina, well positioned.  Patchy bilateral airspace opacities are unchanged from the previous day's study.  No pneumothorax.  The right subclavian central venous line and the nasogastric tube are both stable well-positioned.  IMPRESSION: Endotracheal tube now has its tip 1 cm above the carina.  Stable patchy bilateral airspace lung  opacities.   Original Report Authenticated By: Amie Portland, M.D.    Dg Abd Portable 1v  04/07/2012  *RADIOLOGY REPORT*  Clinical Data: Verify O G tube placement  PORTABLE ABDOMEN - 1 VIEW  Comparison: Chest x-ray obtained earlier today, 04/07/2012  Findings: An orogastric tube is present.  The tip projects over the right upper quadrant, likely in the peripyloric gastric antrum or proximal duodenum.  Surgical clips in the right upper quadrant suggest prior cholecystectomy.  Nonobstructed bowel gas pattern. Left basilar airspace disease.  No acute osseous abnormality.  IMPRESSION:  1.  The tip of the orogastric tube projects over the right upper quadrant likely in the peripyloric gastric antrum or proximal duodenum. 2.  Acute abdominal abnormality. 3.  Left basilar airspace disease.   Original Report Authenticated By: Malachy Moan, M.D.      DIAGNOSES: Principal Problem:  *Acute respiratory failure with hypoxia Active Problems:  Microcytic anemia  Obesity (BMI 30.0-34.9)  ARDS (adult respiratory distress syndrome)  Steroid-induced hyperglycemia  Acute renal insufficiency  Influenza A   ASSESSMENT / PLAN:  PULMONARY  ASSESSMENT: Acute respiratory failure with ARDS 2nd to Influenza pneumonia. Respiratory mechanics improved with diuresis on 04/02/12  04/07/12:Failed exutbation and reintubated. ET tube in RMB - repositioned per RT 04/08/12: Doing PSV well  PLAN:   PSV as tolerated Hold off extubation till infiltrates clear on CXR v do Trach Check CXR 04/08/12 for ET Tube position   CARDIOVASCULAR  Lab 04/06/12 0423  PROBNP 420.9*    ASSESSMENT:  Septic shock 2nd to PNA. Resolved. S/p lasix 12/18 and 12/19  On 04/08/12: Not on pressors.    PLAN:  Lopressor prn for SBP > 170, HR > 120   RENAL  Lab 04/08/12 0530 04/07/12 0510 04/06/12 0423 04/05/12 0355 04/04/12 0515 04/03/12 0509  NA 154* 150* 154* 152* 155* --  K 3.4* 3.3* -- -- -- --  CL 117* 114* 118* 114*  118* --  CO2 26 27 27 29 30  --  GLUCOSE 110* 140* 116* 128* 115* --  BUN 65* 59* 53* 65* 70* --  CREATININE 1.69* 1.54* 1.23* 1.47* 1.65* --  CALCIUM 8.8 8.5 8.9 8.9 8.9 --  MG 2.1 -- 2.1 -- -- 2.3  PHOS 4.6 -- 3.3 -- -- 4.2    ASSESSMENT:   Acute renal failure worsenuing with diuresis Hypernatremia related to diuresis but still volume up   PLAN:   Hold lasix on 12/20 and increase  free water   GASTROINTESTINAL  ASSESSMENT:  Nutrition. Diarrhea. - C Diff neg, Likely tube feed related. PPI changed to pepcid 04/02/12  On 12/19: massive diarreha continues that is foul smelling. C Diff negative On 04/08/12: Diarrhea ? better  PLAN:   Dc flagyl monitor   HEMATOLOGIC  Lab 04/08/12 0002 04/07/12 0510 04/06/12 0423  HGB 7.8* 7.4* 7.6*  HCT 26.4* 24.9* 25.8*  WBC 13.7* 11.2* 11.1*  PLT 341 312 398    ASSESSMENT:   Iron deficiency anemia.  No evidence for bleeding. Marland Kitchen PLAN:  Continue FeSO4 F/u CBC Transfuse for Hb < 7 Continue SQ heparin for DVT prophylaxis since 12/14  INFECTIOUS No results found for this basename: PROCALCITON:5 in the last 168 hours No results found for this basename: PROCALCITON:5 in the last 168 hours  ASSESSMENT:   Influenza A pneumonia. S/p tamiflu Rx ending 12/15  PLAN:   Monitor off tamiflu   ENDOCRINE  ASSESSMENT:   Steroid induced hyperglycemia.   PLAN:   D/c prednisone (done 12/18)  NEUROLOGIC  ASSESSMENT:   At risk for critical illness polyneuropathy (Was on paralytic, and getting steroids).  On 12/18: anxiety, agitation continues. Rx precedex On 12/19: CAM-iCU neg for delirium with precedex On 12/20: CAM-ICu neg with fent/diprivan and haldol  PLAN:   DC diprivan Restart precedex Continue low dose scheduled haldol and wean accordingly   DERM A: sTAGE 1 red rash in perineum noted 04/07/12 P - wound care consult  GLOBAL  - no family at bedside 04/06/12   - 04/07/12: spoke to husband: [phone]. Updated.  Patient son has terminal cancer and patient needs to talk to son immediately after extubation. Husband says emotional support to be provided during the conversation.  Later   - 04/08/12: husband updated. Informed him about possibility of trach. He was not emotionally ready to hear abuot it    The patient is critically ill with multiple organ systems failure and  requires high complexity decision making for assessment and support, frequent evaluation and titration of therapies, application of advanced monitoring technologies and extensive interpretation of multiple databases.   Critical Care Time devoted to patient care services described in this note is  40  Minutes.  [facility] PCCM Pager: [phone] Cell: [phone] If no response, call [phone]   04/08/2012 10:51 AM

## 2012-04-09 ENCOUNTER — Inpatient Hospital Stay (HOSPITAL_COMMUNITY): Payer: MEDICAID

## 2012-04-09 LAB — BASIC METABOLIC PANEL
Chloride: 116 mEq/L — ABNORMAL HIGH (ref 96–112)
Creatinine, Ser: 1.29 mg/dL — ABNORMAL HIGH (ref 0.50–1.10)
GFR calc Af Amer: 53 mL/min — ABNORMAL LOW (ref >90)

## 2012-04-09 MED ORDER — METHYLPREDNISOLONE SODIUM SUCC 125 MG IJ SOLR
80.0000 mg | Freq: Once | INTRAMUSCULAR | Status: AC
  Administered 2012-04-09: 80 mg via INTRAVENOUS
  Filled 2012-04-09: qty 1.28

## 2012-04-09 MED ORDER — FENTANYL CITRATE 0.05 MG/ML IJ SOLN
12.5000 ug | INTRAMUSCULAR | Status: DC | PRN
  Administered 2012-04-09 – 2012-04-10 (×2): 25 ug via INTRAVENOUS
  Filled 2012-04-09: qty 2

## 2012-04-09 MED ORDER — POTASSIUM CHLORIDE 10 MEQ/50ML IV SOLN
10.0000 meq | INTRAVENOUS | Status: AC
  Administered 2012-04-09 (×4): 10 meq via INTRAVENOUS
  Filled 2012-04-09: qty 150

## 2012-04-09 MED ORDER — LORAZEPAM 2 MG/ML IJ SOLN
0.5000 mg | INTRAMUSCULAR | Status: DC | PRN
  Administered 2012-04-09 – 2012-04-11 (×6): 1 mg via INTRAVENOUS
  Filled 2012-04-09 (×6): qty 1

## 2012-04-09 MED ORDER — FUROSEMIDE 10 MG/ML IJ SOLN
40.0000 mg | Freq: Once | INTRAMUSCULAR | Status: AC
  Administered 2012-04-09: 40 mg via INTRAVENOUS

## 2012-04-09 MED ORDER — POTASSIUM CHLORIDE 2 MEQ/ML IV SOLN
INTRAVENOUS | Status: DC
  Administered 2012-04-09 – 2012-04-10 (×2): via INTRAVENOUS
  Filled 2012-04-09 (×3): qty 1000

## 2012-04-09 MED ORDER — FUROSEMIDE 10 MG/ML IJ SOLN
INTRAMUSCULAR | Status: AC
  Administered 2012-04-09: 40 mg via INTRAVENOUS
  Filled 2012-04-09: qty 4

## 2012-04-09 MED ORDER — POTASSIUM CHLORIDE 10 MEQ/50ML IV SOLN
INTRAVENOUS | Status: AC
  Filled 2012-04-09: qty 50

## 2012-04-09 MED ORDER — RACEPINEPHRINE HCL 2.25 % IN NEBU
0.2500 mL | INHALATION_SOLUTION | RESPIRATORY_TRACT | Status: DC | PRN
  Administered 2012-04-09 (×3): 0.25 mL via RESPIRATORY_TRACT
  Administered 2012-04-09: 0.5 mL via RESPIRATORY_TRACT
  Filled 2012-04-09 (×4): qty 0.5

## 2012-04-09 MED ORDER — FUROSEMIDE 10 MG/ML IJ SOLN
40.0000 mg | Freq: Once | INTRAMUSCULAR | Status: AC
  Administered 2012-04-09: 40 mg via INTRAVENOUS
  Filled 2012-04-09: qty 4

## 2012-04-09 MED ORDER — ALBUTEROL SULFATE (5 MG/ML) 0.5% IN NEBU: 2.5000 mg | INHALATION_SOLUTION | RESPIRATORY_TRACT | Status: DC | PRN

## 2012-04-09 NOTE — Progress Notes (Addendum)
PULMONARY  / CRITICAL CARE MEDICINE  Name: Megan Espinoza MRN: [id number] DOB: [birth year]    LOS: 16 Date of admit: 03/24/2012  7:37 PM   REFERRING MD :  Elizabeth Palau  CHIEF COMPLAINT:  Respiratory failure  BRIEF PATIENT DESCRIPTION:  56 yo female admitted 03/24/2012 fever, dry cough, chills, nausea 2nd to PNA complicated by anemia.  Developed worsening hypoxia 12/08 from ARDS and transfer to ICU with PCCM assuming care.  On 12/09 developed VDRF due to ARDS.  Found to have influenza A positive.      SIGNIFICANT EVENTS/TESTS:  12/06 CT chest >>diffuse b/l patchy ASD with air bronchograms 12/08 Transfer to ICU 12/09 Bronchoscopy>>minimal secretions, normal airways, blood tinged BAL 12/09 RF, Ant-DS DNA, SCL-70 Ab, HIV negative/non-reactive 12/09 VDRF, ARDS, nimbex started, steroids started 12/10 Transfuse PRBC, Pressors started 12/11 Nimbex stopped 12/17 - scheduled haldol strted 04/06/12: agitated on wua, very shallow breathing on 5/5.  Rx precedx gtt 04/07/12:  SEVERE DIARRHEA. EXTUBATED and REINTUBATED promptly (she wanted DNAR and DNAI but daughter felt otherwise)  LEVEL OF CARE:  ICU PRIMARY SERVICE:  PCCM CONSULTANTS:  None CODE STATUS: full DIET:  NPO after extubation DVT Px:  SCDs GI Px:  Pepcid   LINES / TUBES: ETT 12/09 >>12/19, 12/19 >> 12/21 Rt [place] CVL 12/09 >>   CULTURES: MRSA screen 12/5 >>negative Blood 12/5 >>negative Urine (clean catch) 12/5 >> diphtheroids (deemed contaminant)  Legionella Ag 12/09 >>negative Strep Ag 12/09 >>negtive Pneumocystis DFA 12/09 >>negative AFB smear 12/09 >>negative Fungal smear/cx 12/09 >>negative Legionella cx 12/09 >>negative resp virus panel 12/09 >> Influenza A POSITIVE  Resp 12/09 >>negative C diff PCR 12/14 >> negative C Diff PCR 12/19 >> NEGATIVE   ANTIBIOTICS: Aztreonam 12/5 >> 12/11  oseltamivir 12/08 >> 12/15 TMP/SMX 12/09 >> 12/10  Levofloxacin 12/5 >> 12/12  vanco 12/5 >> 12/12  Po  flagyl 12/29 (empiric c diff) >>12/20   SUBJECTIVE/OVERNIGHT/INTERVAL HX RASS +1. Indicates that she wants ETT out. Able to pull 950 cc on 5/5. Sig other informs me that her son passed away last night  VITAL SIGNS: Temp:  [97.4 F (36.3 C)-98.9 F (37.2 C)] 98.9 F (37.2 C) (12/21 0800) Pulse Rate:  [62-108] 93  (12/21 0841) Resp:  [18-30] 30  (12/21 0841) BP: (94-142)/(38-99) 139/54 mmHg (12/21 0800) SpO2:  [95 %-100 %] 99 % (12/21 0841) FiO2 (%):  [30 %-40.2 %] 40 % (12/21 0841) HEMODYNAMICS:   VENTILATOR SETTINGS: Vent Mode:  [-] PRVC;CPAP;PSV FiO2 (%):  [30 %-40.2 %] 40 % Set Rate:  [14 bmp] 14 bmp Vt Set:  [460 mL] 460 mL PEEP:  [5 cmH20] 5 cmH20 Pressure Support:  [8 cmH20-10 cmH20] 10 cmH20 Plateau Pressure:  [22 cmH20-24 cmH20] 24 cmH20 INTAKE / OUTPUT: Intake/Output      12/20 0701 - 12/21 0700 12/21 0701 - 12/22 0700   I.V. (mL/kg) 563.3 (4.6) 13.4 (0.1)   Other 600    NG/GT 720 30   Total Intake(mL/kg) 1883.3 (15.4) 43.4 (0.4)   Urine (mL/kg/hr) 1360 (0.5) 60   Total Output 1360 60   Net +523.3 -16.6          PHYSICAL EXAMINATION: Gen: RASS +1, cognition appears intact HEENT: WNL PULM: no wheeze, dependent rales CV: RRR s M Ab: BS+, soft, nontender Ext: trace edema Neuro: No focal deficits   LABS: Cbc  Lab 04/08/12 2344 04/08/12 0002 04/07/12 0510  WBC 11.6* -- --  HGB 7.1* 7.8* 7.4*  HCT 24.5* 26.4* 24.9*  PLT 311 341 312    Chemistry   Lab 04/09/12 0445 04/08/12 0530 04/07/12 0510 04/06/12 0423 04/03/12 0509  NA 152* 154* 150* -- --  K 3.3* 3.4* 3.3* -- --  CL 116* 117* 114* -- --  CO2 27 26 27  -- --  BUN 63* 65* 59* -- --  CREATININE 1.29* 1.69* 1.54* -- --  CALCIUM 8.8 8.8 8.5 -- --  MG -- 2.1 -- 2.1 2.3  PHOS -- 4.6 -- 3.3 4.2  GLUCOSE 114* 110* 140* -- --    Liver fxn No results found for this basename: AST:3,ALT:3,ALKPHOS:3,BILITOT:3,PROT:3,ALBUMIN:3 in the last 168 hours coags No results found for this basename:  APTT:3,INR:3 in the last 168 hours Sepsis markers No results found for this basename: LATICACIDVEN:3,PROCALCITON:3 in the last 168 hours Cardiac markers No results found for this basename: CKTOTAL:3,CKMB:3,TROPONINI:3 in the last 168 hours BNP  Lab 04/06/12 0423  PROBNP 420.9*   ABG  Lab 04/07/12 0500 04/02/12 1133  PHART 7.431 7.394  PCO2ART 39.7 47.2*  PO2ART 71.8* 87.4  HCO3 25.6* 28.1*  TCO2 26.8 29.5    CBG trend  Lab 04/04/12 0830 04/03/12 0806 04/03/12 0343 04/02/12 2328 04/02/12 1946  GLUCAP 121* 116* 80 91 107*    CXR: NSC ARDS pattern   DIAGNOSES: Principal Problem:  *Acute respiratory failure with hypoxia Active Problems:  ARDS (adult respiratory distress syndrome)  Influenza A  Microcytic anemia  Steroid-induced hyperglycemia  Acute renal insufficiency  Obesity (BMI 30.0-34.9) Hypernatremia Hypokalemia Agitation Stridor  ASSESSMENT / PLAN: Trial extubation today PRN BiPAP If fails extubation, reintubate and trach early next week. Pt agrees to this Replete free H2O and K+ Transfuse for Hgb < 7.0 Minimize phlebotomy FeSO4 supplement when taking POs Monitor renal panel intermittently Resume SSI if CBGs > 180 Cont H2RB post extubation due to Fe def anemia Will need eval for mirocytic anemia eventually   Discussed with patient and significant other.   35 mins CCM time      ADD: mild to mod stridor post extubation > Racemic epi and one dose methylpred ordered   [name], MD ; [facility] [phone].  After 5:30 PM or weekends, call [phone]

## 2012-04-09 NOTE — Procedures (Signed)
Extubation Procedure Note  Patient Details:   Name: Megan Espinoza DOB: [birth year] MRN: [id number]   Airway Documentation:     Evaluation  O2 sats: stable throughout Complications: No apparent complications Patient did tolerate procedure well. Bilateral Breath Sounds: Diminished Suctioning: Airway Yes Pt extubated per Dr. Jarrett Ables.  Placed on 5L [place].  Sats 95    Stevens Magwood V 04/09/2012, 9:07 AM

## 2012-04-10 LAB — BASIC METABOLIC PANEL
CO2: 30 mEq/L (ref 19–32)
Chloride: 109 mEq/L (ref 96–112)
Glucose, Bld: 119 mg/dL — ABNORMAL HIGH (ref 70–99)
Potassium: 3.4 mEq/L — ABNORMAL LOW (ref 3.5–5.1)
Sodium: 149 mEq/L — ABNORMAL HIGH (ref 135–145)

## 2012-04-10 LAB — CBC
Hemoglobin: 7.8 g/dL — ABNORMAL LOW (ref 12.0–15.0)
MCH: 20.3 pg — ABNORMAL LOW (ref 26.0–34.0)
MCV: 70.1 fL — ABNORMAL LOW (ref 78.0–100.0)
Platelets: 333 10*3/uL (ref 150–400)
RBC: 3.84 MIL/uL — ABNORMAL LOW (ref 3.87–5.11)
WBC: 11.6 10*3/uL — ABNORMAL HIGH (ref 4.0–10.5)

## 2012-04-10 MED ORDER — ALBUTEROL SULFATE (5 MG/ML) 0.5% IN NEBU
2.5000 mg | INHALATION_SOLUTION | RESPIRATORY_TRACT | Status: DC
  Administered 2012-04-10 – 2012-04-11 (×7): 2.5 mg via RESPIRATORY_TRACT
  Filled 2012-04-10 (×7): qty 0.5

## 2012-04-10 MED ORDER — VANCOMYCIN HCL 10 G IV SOLR
2000.0000 mg | Freq: Once | INTRAVENOUS | Status: AC
  Administered 2012-04-10: 2000 mg via INTRAVENOUS
  Filled 2012-04-10: qty 2000

## 2012-04-10 MED ORDER — VANCOMYCIN HCL 10 G IV SOLR
1250.0000 mg | Freq: Two times a day (BID) | INTRAVENOUS | Status: DC
  Administered 2012-04-10 – 2012-04-11 (×2): 1250 mg via INTRAVENOUS
  Filled 2012-04-10 (×3): qty 1250

## 2012-04-10 MED ORDER — METHYLPREDNISOLONE SODIUM SUCC 125 MG IJ SOLR
60.0000 mg | Freq: Four times a day (QID) | INTRAMUSCULAR | Status: DC
  Administered 2012-04-10 – 2012-04-11 (×5): 60 mg via INTRAVENOUS
  Filled 2012-04-10 (×9): qty 0.96

## 2012-04-10 MED ORDER — DEXTROSE 5 % IV SOLN
2.0000 g | Freq: Three times a day (TID) | INTRAVENOUS | Status: DC
  Administered 2012-04-10 – 2012-04-11 (×3): 2 g via INTRAVENOUS
  Filled 2012-04-10 (×5): qty 2

## 2012-04-10 MED ORDER — IPRATROPIUM BROMIDE 0.02 % IN SOLN
0.5000 mg | Freq: Four times a day (QID) | RESPIRATORY_TRACT | Status: DC
  Administered 2012-04-10 – 2012-04-11 (×5): 0.5 mg via RESPIRATORY_TRACT
  Filled 2012-04-10 (×5): qty 2.5

## 2012-04-10 MED ORDER — FUROSEMIDE 10 MG/ML IJ SOLN
40.0000 mg | Freq: Once | INTRAMUSCULAR | Status: AC
  Administered 2012-04-10: 40 mg via INTRAVENOUS
  Filled 2012-04-10: qty 4

## 2012-04-10 NOTE — Progress Notes (Addendum)
ANTIBIOTIC CONSULT NOTE - INITIAL  Pharmacy Consult for Vancomycin and Aztreonam Indication: rule out pneumonia  Allergies  Allergen Reactions  . Penicillins Swelling  . Codeine Nausea And Vomiting    Patient Measurements: Height: 5\' 5"  (165.1 cm) Weight: 269 lb 2.9 oz (122.1 kg) IBW/kg (Calculated) : 57  Adjusted Body Weight: 86  Vital Signs: Temp: 98.4 F (36.9 C) (12/22 0400) Temp src: Axillary (12/22 0400) BP: 113/64 mmHg (12/22 0700) Pulse Rate: 82  (12/22 0700) Intake/Output from previous day: 12/21 0701 - 12/22 0700 In: 2158.3 [I.V.:1890.3; NG/GT:60; IV Piggyback:208] Out: 2835 [Urine:2835] Intake/Output from this shift:    Labs:  Basename 04/10/12 0550 04/09/12 0445 04/08/12 2344 04/08/12 0530 04/08/12 0002  WBC 11.6* -- 11.6* -- 13.7*  HGB 7.8* -- 7.1* -- 7.8*  PLT 333 -- 311 -- 341  LABCREA -- -- -- -- --  CREATININE 1.20* 1.29* -- 1.69* --   Estimated Creatinine Clearance: 68.6 ml/min (by C-G formula based on Cr of 1.2). No results found for this basename: VANCOTROUGH:2,VANCOPEAK:2,VANCORANDOM:2,GENTTROUGH:2,GENTPEAK:2,GENTRANDOM:2,TOBRATROUGH:2,TOBRAPEAK:2,TOBRARND:2,AMIKACINPEAK:2,AMIKACINTROU:2,AMIKACIN:2, in the last 72 hours   Microbiology: Recent Results (from the past 720 hour(s))  URINE CULTURE     Status: Normal   Collection Time   03/24/12  8:49 PM      Component Value Range Status Comment   Specimen Description URINE, CLEAN CATCH   Final    Special Requests NONE CX ADDED AT 2124 ON [id number]   Final    Culture  Setup Time 03/24/2012 22:54   Final    Colony Count >=100,000 COLONIES/ML   Final    Culture     Final    Value: DIPHTHEROIDS(CORYNEBACTERIUM SPECIES)     Note: Standardized susceptibility testing for this organism is not available.   Report Status 03/26/2012 FINAL   Final   CULTURE, BLOOD (ROUTINE X 2)     Status: Normal   Collection Time   03/24/12 10:45 PM      Component Value Range Status Comment   Specimen Description BLOOD  RIGHT HAND   Final    Special Requests BOTTLES DRAWN AEROBIC AND ANAEROBIC 10CC   Final    Culture  Setup Time 03/25/2012 03:42   Final    Culture NO GROWTH 5 DAYS   Final    Report Status 03/31/2012 FINAL   Final   CULTURE, BLOOD (ROUTINE X 2)     Status: Normal   Collection Time   03/24/12 10:50 PM      Component Value Range Status Comment   Specimen Description BLOOD LEFT HAND   Final    Special Requests BOTTLES DRAWN AEROBIC AND ANAEROBIC   Final    Culture  Setup Time 03/25/2012 03:42   Final    Culture NO GROWTH 5 DAYS   Final    Report Status 03/31/2012 FINAL   Final   MRSA PCR SCREENING     Status: Normal   Collection Time   03/26/12 10:00 PM      Component Value Range Status Comment   MRSA by PCR NEGATIVE  NEGATIVE Final   CULTURE, RESPIRATORY     Status: Normal   Collection Time   03/28/12 11:48 AM      Component Value Range Status Comment   Specimen Description BRONCHIAL WASHINGS   Final    Special Requests NONE   Final    Gram Stain     Final    Value: NO WBC SEEN     NO SQUAMOUS EPITHELIAL CELLS SEEN  NO ORGANISMS SEEN   Culture NO GROWTH 2 DAYS   Final    Report Status 03/31/2012 FINAL   Final   CULTURE, BAL-QUANTITATIVE     Status: Normal   Collection Time   03/28/12 11:49 AM      Component Value Range Status Comment   Specimen Description BRONCHIAL WASHINGS   Final    Special Requests NONE   Final    Gram Stain     Final    Value: RARE WBC PRESENT, PREDOMINANTLY PMN     RARE SQUAMOUS EPITHELIAL CELLS PRESENT     NO ORGANISMS SEEN   Colony Count NO GROWTH   Final    Culture NO GROWTH 2 DAYS   Final    Report Status 03/31/2012 FINAL   Final   AFB CULTURE WITH SMEAR     Status: Normal (Preliminary result)   Collection Time   03/28/12 11:50 AM      Component Value Range Status Comment   Specimen Description BRONCHIAL WASHINGS   Final    Special Requests NONE   Final    ACID FAST SMEAR NO ACID FAST BACILLI SEEN   Final    Culture     Final    Value:  CULTURE WILL BE EXAMINED FOR 6 WEEKS BEFORE ISSUING A FINAL REPORT   Report Status PENDING   Incomplete   FUNGUS CULTURE W SMEAR     Status: Normal (Preliminary result)   Collection Time   03/28/12 11:50 AM      Component Value Range Status Comment   Specimen Description BRONCHIAL WASHINGS   Final    Special Requests NONE   Final    Fungal Smear     Final    Value: NO YEAST OR FUNGAL ELEMENTS SEEN PREVIOUSLY REPORTED AS NOT DONE CORRECTED RESULTS CALLED TO: JESSICA DAVIS 10:50 03/29/12 FUENG   Culture CULTURE IN PROGRESS FOR FOUR WEEKS   Final    Report Status PENDING   Incomplete   LEGIONELLA CULTURE     Status: Normal   Collection Time   03/28/12 11:50 AM      Component Value Range Status Comment   Specimen Description BRONCHIAL WASHINGS   Final    Special Requests NONE   Final    Culture NO LEGIONELLA ISOLATED   Final    Report Status 04/02/2012 FINAL   Final   PNEUMOCYSTIS JIROVECI SMEAR BY DFA     Status: Normal   Collection Time   03/28/12 11:50 AM      Component Value Range Status Comment   Specimen Source-[facility] BRONCHIAL WASHINGS   Final    Pneumocystis jiroveci Ag NEGATIVE   Final Performed at [facility]  RESPIRATORY VIRUS PANEL     Status: Abnormal   Collection Time   03/28/12 11:50 AM      Component Value Range Status Comment   Source - RVPAN     Corrected CORRECTED ON 12/11 AT 1112: PREVIOUSLY REPORTED AS BRONCHIAL WASHINGS CORRECTED ON 12/09 AT 1159: PREVIOUSLY REPORTED AS BRONCHIAL ALVEOLAR LAVAGE   Value: CORRECTED ON 12/09 AT 1159: PREVIOUSLY REPORTED AS BRONCHIAL ALVEOLAR LAVAGE   Respiratory Syncytial Virus A NOT DETECTED   Final    Respiratory Syncytial Virus B NOT DETECTED   Final    Influenza A DETECTED (*)  Final    Influenza B NOT DETECTED   Final    Parainfluenza 1 NOT DETECTED   Final    Parainfluenza 2 NOT DETECTED  Final    Parainfluenza 3 NOT DETECTED   Final    Metapneumovirus NOT DETECTED   Final    Rhinovirus NOT DETECTED   Final     Adenovirus NOT DETECTED   Final    Influenza A H1 NOT DETECTED   Final    Influenza A H3 NOT DETECTED   Final   CLOSTRIDIUM DIFFICILE BY PCR     Status: Normal   Collection Time   04/01/12  6:54 PM      Component Value Range Status Comment   C difficile by pcr NEGATIVE  NEGATIVE Final   CLOSTRIDIUM DIFFICILE BY PCR     Status: Normal   Collection Time   04/07/12 10:00 AM      Component Value Range Status Comment   C difficile by pcr NEGATIVE  NEGATIVE Final     Medical History: Past Medical History  Diagnosis Date  . Obesity (BMI 30.0-34.9)     Medications:  Scheduled:    . ipratropium  0.5 mg Nebulization QID   And  . albuterol  2.5 mg Nebulization Q4H  . antiseptic oral rinse  15 mL Mouth Rinse QID  . chlorhexidine  15 mL Mouth Rinse BID  . ferrous sulfate  300 mg Per Tube BID WC  . [COMPLETED] furosemide  40 mg Intravenous Once  . [COMPLETED] furosemide  40 mg Intravenous Once  . furosemide  40 mg Intravenous Once  . methylPREDNISolone (SOLU-MEDROL) injection  60 mg Intravenous Q6H  . [COMPLETED] methylPREDNISolone (SOLU-MEDROL) injection  80 mg Intravenous Once  . [COMPLETED] potassium chloride  10 mEq Intravenous Q1 Hr x 4  . [COMPLETED] potassium chloride      . [DISCONTINUED] famotidine  20 mg Per Tube Q12H  . [DISCONTINUED] feeding supplement  60 mL Per Tube TID  . [DISCONTINUED] feeding supplement (VITAL AF 1.2 CAL)  1,000 mL Per Tube Q24H  . [DISCONTINUED] free water  300 mL Per Tube Q4H  . [DISCONTINUED] haloperidol lactate  2 mg Intravenous Q6H  . [DISCONTINUED] multivitamin  5 mL Per Tube Daily   Assessment: 56yo female with possible post-influenza pneumonia, to start antibiotics.  Pt received Vancomycin 12/8 - 12/11, with elevated trough at the time.  Cr was elevated at that time as well, ranging 1.8-2.2.  Cr is now 1.2 with uop 1 ml/kg/hr.  Pt with PCN allergy -> swelling.  Paged MD and will change Cefepime to Aztreonam.  Goal of Therapy:  Vancomycin  trough level 15-20 mcg/ml  Plan:  1.  Vancomycin 2000mg  IV x 1, 1250mg  IV bid 2.  Aztreonam 2gm IV q8 3.  Watch renal function closely 4.  Check Vancomycin trough on 12/24 AM  Marisue Humble, PharmD Clinical Pharmacist [facility]

## 2012-04-10 NOTE — Progress Notes (Signed)
PULMONARY  / CRITICAL CARE MEDICINE  Name: Megan Espinoza MRN: [id number] DOB: September 29, 1955    LOS: 17 Date of admit: 03/24/2012  7:37 PM   REFERRING MD :  Elizabeth Palau  CHIEF COMPLAINT:  Respiratory failure  BRIEF PATIENT DESCRIPTION:  56 yo female admitted 03/24/2012 fever, dry cough, chills, nausea 2nd to PNA complicated by anemia.  Developed worsening hypoxia 12/08 from ARDS and transfer to ICU with PCCM assuming care.  On 12/09 developed VDRF due to ARDS.  Found to have influenza A positive.      SIGNIFICANT EVENTS/TESTS:  12/06 CT chest >>diffuse b/l patchy ASD with air bronchograms 12/08 Transfer to ICU 12/09 Bronchoscopy>>minimal secretions, normal airways, blood tinged BAL 12/09 RF, Ant-DS DNA, SCL-70 Ab, HIV negative/non-reactive 12/09 VDRF, ARDS, nimbex started, steroids started 12/10 Transfuse PRBC, Pressors started 12/11 Nimbex stopped 12/17 - scheduled haldol strted 04/06/12: agitated on wua, very shallow breathing on 5/5.  Rx precedx gtt 04/07/12:  SEVERE DIARRHEA. EXTUBATED and REINTUBATED promptly (she wanted DNAR and DNAI but daughter felt otherwise) 04/09/12: extubated, marginal, placed on bipap PM 12/21.  LEVEL OF CARE:  ICU PRIMARY SERVICE:  PCCM CONSULTANTS:  None CODE STATUS: full DIET:  NPO after extubation DVT Px:  SCDs GI Px:  Pepcid   LINES / TUBES: ETT 12/09 >>12/19, 12/19 >> 12/21 Rt [place] CVL 12/09 >>   CULTURES: MRSA screen 12/5 >>negative Blood 12/5 >>negative Urine (clean catch) 12/5 >> diphtheroids (deemed contaminant)  Legionella Ag 12/09 >>negative Strep Ag 12/09 >>negtive Pneumocystis DFA 12/09 >>negative AFB smear 12/09 >>negative Fungal smear/cx 12/09 >>negative Legionella cx 12/09 >>negative resp virus panel 12/09 >> Influenza A POSITIVE  Resp 12/09 >>negative C diff PCR 12/14 >> negative C Diff PCR 12/19 >> NEGATIVE   ANTIBIOTICS: Aztreonam 12/5 >> 12/11  oseltamivir 12/08 >> 12/15 TMP/SMX 12/09 >>  12/10  Levofloxacin 12/5 >> 12/12  vanco 12/5 >> 12/12  Po flagyl 12/29 (empiric c diff) >>12/20   SUBJECTIVE/OVERNIGHT/INTERVAL HX RASS +1. Indicates that she wants ETT out. Able to pull 950 cc on 5/5. Sig other informs me that her son passed away last night  VITAL SIGNS: Temp:  [97.9 F (36.6 C)-98.6 F (37 C)] 98.4 F (36.9 C) (12/22 0400) Pulse Rate:  [65-131] 82  (12/22 0700) Resp:  [21-35] 23  (12/22 0700) BP: (109-177)/(46-86) 113/64 mmHg (12/22 0700) SpO2:  [92 %-100 %] 98 % (12/22 0700) FiO2 (%):  [40 %] 40 % (12/22 0432)  HEMODYNAMICS:   VENTILATOR SETTINGS: Vent Mode:  [-] PRVC;CPAP;PSV FiO2 (%):  [40 %] 40 % PEEP:  [5 cmH20] 5 cmH20 Pressure Support:  [10 cmH20] 10 cmH20  INTAKE / OUTPUT: Intake/Output      12/21 0701 - 12/22 0700 12/22 0701 - 12/23 0700   I.V. (mL/kg) 1890.3 (15.5)    Other     NG/GT 60    IV Piggyback 208    Total Intake(mL/kg) 2158.3 (17.7)    Urine (mL/kg/hr) 2835 (1)    Total Output 2835    Net -676.7           PHYSICAL EXAMINATION: Gen: RASS 0, cognition appears intact, on precedex HEENT: WNL PULM: exp wheeze, scattered rhonchi, not able to expectorate mucus CV: RRR s M Ab: BS+, soft, nontender Ext: trace edema Neuro: No focal deficits   LABS: Cbc  Lab 04/10/12 0550 04/08/12 2344 04/08/12 0002  WBC 11.6* -- --  HGB 7.8* 7.1* 7.8*  HCT 26.9* 24.5* 26.4*  PLT 333 311 341  Chemistry   Lab 04/10/12 0550 04/09/12 0445 04/08/12 0530 04/06/12 0423  NA 149* 152* 154* --  K 3.4* 3.3* 3.4* --  CL 109 116* 117* --  CO2 30 27 26  --  BUN 43* 63* 65* --  CREATININE 1.20* 1.29* 1.69* --  CALCIUM 9.3 8.8 8.8 --  MG -- -- 2.1 2.1  PHOS -- -- 4.6 3.3  GLUCOSE 119* 114* 110* --    Liver fxn No results found for this basename: AST:3,ALT:3,ALKPHOS:3,BILITOT:3,PROT:3,ALBUMIN:3 in the last 168 hours coags No results found for this basename: APTT:3,INR:3 in the last 168 hours Sepsis markers No results found for this  basename: LATICACIDVEN:3,PROCALCITON:3 in the last 168 hours Cardiac markers No results found for this basename: CKTOTAL:3,CKMB:3,TROPONINI:3 in the last 168 hours BNP  Lab 04/06/12 0423  PROBNP 420.9*   ABG  Lab 04/07/12 0500  PHART 7.431  PCO2ART 39.7  PO2ART 71.8*  HCO3 25.6*  TCO2 26.8    CBG trend  Lab 04/04/12 0830 04/03/12 0806  GLUCAP 121* 116*    CXR: no film on 12/22 available  DIAGNOSES: Principal Problem:  *Acute respiratory failure with hypoxia Active Problems:  Microcytic anemia  Obesity (BMI 30.0-34.9)  ARDS (adult respiratory distress syndrome)  Steroid-induced hyperglycemia  Acute renal insufficiency  Influenza A Hypernatremia Hypokalemia Agitation Stridor  ASSESSMENT / PLAN: 1. Influenza A with acute resp failure and now bronchospasm and persisting pulm infiltrates with positive I>>>O  Plan: Diurese BDs on schedule Cont Bipap, high risk for reintubation, if so>>trach 12/23 IV medrol on schedule IVF KVO Start empiric cefepime/vanco  ?post influenza staph   2. ARF Monitor bmet daily  3. AMS/encephalopathy  Plan  Try off precedex   4. Steroid induced hyperglycemia  Plan HG protocol  5. Upper airway obstruction. Obese  Plan Cont bipap,  May need trach  6. Hypernatremia, hypokalemia Improved  Plan  Monitor bmet daily  7. Rash in perineum Plan WOC consult f/u   8. Critical illness polyneuropathy and myopathy, was on NMBs Plan Will need rehab when recovers PT/OT when more stable pulm wise  9.Micro cytic anemia  Plan Monitor  Family not present on AM rounds. Will update later.   35 mins CCM time  Caryl Bis  [phone]  Cell  [phone]  If no response or cell goes to voicemail, call beeper [phone] 04/10/2012 8:11AM

## 2012-04-11 ENCOUNTER — Inpatient Hospital Stay (HOSPITAL_COMMUNITY): Payer: MEDICAID

## 2012-04-11 LAB — CBC
HCT: 28.9 % — ABNORMAL LOW (ref 36.0–46.0)
MCH: 20.3 pg — ABNORMAL LOW (ref 26.0–34.0)
MCV: 69.1 fL — ABNORMAL LOW (ref 78.0–100.0)
Platelets: 370 10*3/uL (ref 150–400)
RBC: 4.18 MIL/uL (ref 3.87–5.11)
WBC: 10.9 10*3/uL — ABNORMAL HIGH (ref 4.0–10.5)

## 2012-04-11 LAB — BASIC METABOLIC PANEL
CO2: 27 mEq/L (ref 19–32)
Calcium: 9.5 mg/dL (ref 8.4–10.5)
Chloride: 109 mEq/L (ref 96–112)
Creatinine, Ser: 1.09 mg/dL (ref 0.50–1.10)
Glucose, Bld: 166 mg/dL — ABNORMAL HIGH (ref 70–99)
Sodium: 148 mEq/L — ABNORMAL HIGH (ref 135–145)

## 2012-04-11 MED ORDER — ALBUTEROL SULFATE (5 MG/ML) 0.5% IN NEBU
2.5000 mg | INHALATION_SOLUTION | Freq: Four times a day (QID) | RESPIRATORY_TRACT | Status: DC
  Administered 2012-04-11 – 2012-04-12 (×3): 2.5 mg via RESPIRATORY_TRACT
  Filled 2012-04-11 (×4): qty 0.5

## 2012-04-11 MED ORDER — IPRATROPIUM BROMIDE 0.02 % IN SOLN
0.5000 mg | Freq: Four times a day (QID) | RESPIRATORY_TRACT | Status: DC
  Administered 2012-04-11 – 2012-04-12 (×4): 0.5 mg via RESPIRATORY_TRACT
  Filled 2012-04-11 (×4): qty 2.5

## 2012-04-11 MED ORDER — POTASSIUM CHLORIDE 10 MEQ/50ML IV SOLN
10.0000 meq | INTRAVENOUS | Status: AC
  Administered 2012-04-11 (×4): 10 meq via INTRAVENOUS
  Filled 2012-04-11: qty 50
  Filled 2012-04-11: qty 100
  Filled 2012-04-11: qty 50

## 2012-04-11 MED ORDER — HEPARIN SODIUM (PORCINE) 5000 UNIT/ML IJ SOLN
5000.0000 [IU] | Freq: Three times a day (TID) | INTRAMUSCULAR | Status: DC
  Administered 2012-04-11 – 2012-04-14 (×10): 5000 [IU] via SUBCUTANEOUS
  Filled 2012-04-11 (×13): qty 1

## 2012-04-11 MED ORDER — POTASSIUM CHLORIDE CRYS ER 20 MEQ PO TBCR
40.0000 meq | EXTENDED_RELEASE_TABLET | Freq: Once | ORAL | Status: DC
  Filled 2012-04-11: qty 2

## 2012-04-11 MED ORDER — SODIUM CHLORIDE 0.9 % IV SOLN
INTRAVENOUS | Status: DC
  Administered 2012-04-11: 11:00:00 via INTRAVENOUS

## 2012-04-11 MED ORDER — ACETAMINOPHEN 325 MG PO TABS: 650.0000 mg | ORAL_TABLET | Freq: Four times a day (QID) | ORAL | Status: DC | PRN

## 2012-04-11 MED ORDER — METHYLPREDNISOLONE SODIUM SUCC 40 MG IJ SOLR
40.0000 mg | Freq: Two times a day (BID) | INTRAMUSCULAR | Status: DC
  Administered 2012-04-11 – 2012-04-12 (×2): 40 mg via INTRAVENOUS
  Filled 2012-04-11 (×4): qty 1

## 2012-04-11 NOTE — Evaluation (Addendum)
Physical Therapy Evaluation Patient Details Name: Megan Espinoza MRN: [id number] DOB: [birth year] Today's Date: 04/11/2012 Time: 4098-1191 PT Time Calculation (min): 32 min  PT Assessment / Plan / Recommendation Clinical Impression  Pt admitted with CAP found to be flu positive, developed ARDS, hypoxia with VDRF 12/9-12/21 and sepsis. Pt very pleasant and eager to be able to get OOB to sit up for meals and to progress function. Pt on 35% venturi mask on arrival with sats 92% with decrease to 83% with attempts at scooting on EOB and HR up to 143. Pt requires grossly to recover prior to next attempt, Increased venturi to 50% for increased standing trials and return to bed with sats 85-89% and HR 130-140. Pt with sats 93% on 40% end of session with HR 110. Increased time between all transfers and mobility attempts due to respiratory status. Pt will benefit from acute therapy to maximize function, transfers and moblility to return to PLOF and decrease burden of care.     PT Assessment  Patient needs continued PT services    Follow Up Recommendations  CIR    Does the patient have the potential to tolerate intense rehabilitation      Barriers to Discharge Other (comment) (unsure how much caregiver support)      Equipment Recommendations  Rolling walker with 5" wheels (wide RW)    Recommendations for Other Services OT consult;Rehab consult   Frequency Min 3X/week    Precautions / Restrictions Precautions Precautions: Fall Precaution Comments: oxygen   Pertinent Vitals/Pain No pain Pt with hypokalemia and discussed with RN prior to visit who stated pt had been receiving in IV until 11am and that MD had been made aware. No significant arrhythmias during session, tachycardia and mobility limited.       Mobility  Bed Mobility Bed Mobility: Rolling Left;Supine to Sit;Sitting - Scoot to [place] of Bed;Sit to Supine;Scooting to [facility] Rolling Left: 4: Min assist;With rail (cueing for  sequence) Supine to Sit: 4: Min assist;HOB elevated (HOB 30degrees) Sitting - Scoot to Edge of Bed: 4: Min assist Sit to Supine: 2: Max assist Scooting to [facility]: 1: +2 Total assist Scooting to [facility]: Patient Percentage: 30% Details for Bed Mobility Assistance: pt able to pivot to EOB with HOB elevated with increased time and cueing for sequence with assist at pad. For return to bed cueing for sequence with assist to elevate legs onto surface. Scooting to [facility] with trendelenburg and pt assisting with bil LE Transfers Transfers: Sit to Stand Sit to Stand: 1: +2 Total assist;From bed;From elevated surface Sit to Stand: Patient Percentage: 30% Details for Transfer Assistance: Pt attempted standing x 6 trials with one and 2 person assist. Pt able to shift weight anteiorly and scoot hips but unable to clear buttock from surface into standing. Pt scooted to right x 3 and then with increased assist with further attempts at standing and scooting back to left toward HOB.  Ambulation/Gait Ambulation/Gait Assistance: Not tested (comment)    Shoulder Instructions     Exercises     PT Diagnosis: Generalized weakness;Difficulty walking  PT Problem List: Decreased strength;Decreased activity tolerance;Decreased mobility;Decreased knowledge of use of DME;Cardiopulmonary status limiting activity;Obesity PT Treatment Interventions: Gait training;DME instruction;Functional mobility training;Therapeutic activities;Therapeutic exercise;Patient/family education   PT Goals Acute Rehab PT Goals PT Goal Formulation: With patient Time For Goal Achievement: 04/25/12 Potential to Achieve Goals: Fair Pt will go Supine/Side to Sit: with supervision;with HOB 0 degrees PT Goal: Supine/Side to Sit - Progress: Goal set  today Pt will go Sit to Supine/Side: with min assist;with HOB 0 degrees PT Goal: Sit to Supine/Side - Progress: Goal set today Pt will go Sit to Stand: with +2 total assist (pt=60%) PT Goal: Sit to Stand -  Progress: Goal set today Pt will go Stand to Sit: with +2 total assist (pt=60%) PT Goal: Stand to Sit - Progress: Goal set today Pt will Transfer Bed to Chair/Chair to Bed: with +2 total assist (pt=60%) PT Transfer Goal: Bed to Chair/Chair to Bed - Progress: Goal set today Pt will Ambulate: 1 - 15 feet;with +2 total assist;with least restrictive assistive device (pt=60%) PT Goal: Ambulate - Progress: Goal set today  Visit Information  Last PT Received On: 04/11/12 Assistance Needed: +2    Subjective Data  Subjective: "My son just died of CA since I've been in here, he was 71" Patient Stated Goal: return home and to work   Prior Functioning  Home Living Lives With: Spouse (3 dogs) Available Help at Discharge: Family;Available PRN/intermittently Type of Home: Mobile home Home Access: Stairs to enter Entrance Stairs-Number of Steps: 3 Entrance Stairs-Rails: None Home Layout: One level Bathroom Shower/Tub: Walk-in shower;Tub/shower Orthoptist: Standard Home Adaptive Equipment: Other (comment) (pt has RW, 3in1 and hospital bed that belonged to son) Prior Function Level of Independence: Independent Able to Take Stairs?: Yes Driving: Yes Vocation: Full time employment Comments: Pt works for [employer] Communication Communication: Other (comment) (soft spoken and difficult to hear over venturi mask)    Cognition  Overall Cognitive Status: Appears within functional limits for tasks assessed/performed Arousal/Alertness: Awake/alert Orientation Level: Appears intact for tasks assessed Behavior During Session: [facility] for tasks performed    Extremity/Trunk Assessment Right Lower Extremity Assessment RLE ROM/Strength/Tone: Deficits RLE ROM/Strength/Tone Deficits: grossly 2/5 did not formally assess secondary to respiratory status Left Lower Extremity Assessment LLE ROM/Strength/Tone: Deficits LLE ROM/Strength/Tone Deficits: grossly 2/5 did not formally assess secondary to  respiratory status Trunk Assessment Trunk Assessment: Normal   Balance Static Sitting Balance Static Sitting - Balance Support: Feet supported;No upper extremity supported Static Sitting - Level of Assistance: 5: Stand by assistance Static Sitting - Comment/# of Minutes: 15  End of Session PT - End of Session Activity Tolerance: Patient limited by fatigue Patient left: in bed;with call bell/phone within reach;Other (comment) (in chair position) Nurse Communication: Mobility status       Delorse Lek 04/11/2012, 4:56 PM  Delaney Meigs, PT [phone]

## 2012-04-11 NOTE — Progress Notes (Signed)
Rehab Admissions Coordinator Note:  Patient was screened by Clois Dupes for appropriateness for an Inpatient Acute Rehab Consult.  At this time, we are recommending Inpatient Rehab consult and OT eval.  Clois Dupes, RN 04/11/2012, 7:45 PM  I can be reached at [phone].

## 2012-04-11 NOTE — Progress Notes (Signed)
NUTRITION FOLLOW UP  Intervention:   Magic cup TID between meals, each supplement provides 290 kcal and 9 grams of protein, once diet advanced.    Nutrition Dx:   Inadequate oral intake related to inability to eat as evidenced by NPO status; progressing.  Goal:   Enteral nutrition to provide 60-70% of estimated calorie needs (22-25 kcals/kg ideal body weight) and >/= 90% of estimated protein needs, based on ASPEN guidelines for permissive underfeeding in critically ill obese individuals; NA  NEW Goal:  Pt to meet >/= 90% of their estimated nutrition needs.  Monitor:   PO intake, weight  Assessment:   Pt intubated from 12/19-12/21. Pt positive Influenza A. Pt discussed with RN, diarrhea continues off of TF. Flexiseal was removed 12/22. Pt to start diet after being assessed by SLP.   Height: Ht Readings from Last 1 Encounters:  No data found for Ht  5'5"  Weight Status:   Wt Readings from Last 1 Encounters:  04/11/12 252 lb 6.8 oz (114.5 kg)    Re-estimated needs:  Kcal: 1950-2150 Protein: 100-120 grams Fluid: > 2 L/day   Skin: perineum breakdown due to moisture  Diet Order: NPO   Intake/Output Summary (Last 24 hours) at 04/11/12 1459 Last data filed at 04/11/12 1400  Gross per 24 hour  Intake   1012 ml  Output    975 ml  Net     37 ml    Last bm: Rectal pouch inserted on 12/13 and removed 12/22 Diarrhea x 5 12/22  Labs:   Lab 04/11/12 0520 04/10/12 0550 04/09/12 0445 04/08/12 0530 04/06/12 0423  NA 148* 149* 152* -- --  K 2.8* 3.4* 3.3* -- --  CL 109 109 116* -- --  CO2 27 30 27  -- --  BUN 44* 43* 63* -- --  CREATININE 1.09 1.20* 1.29* -- --  CALCIUM 9.5 9.3 8.8 -- --  MG -- -- -- 2.1 2.1  PHOS -- -- -- 4.6 3.3  GLUCOSE 166* 119* 114* -- --   CBG (last 3)  No results found for this basename: GLUCAP:3 in the last 72 hours  Scheduled Meds:    . ipratropium  0.5 mg Nebulization Q6H   And  . albuterol  2.5 mg Nebulization Q6H  . antiseptic oral  rinse  15 mL Mouth Rinse QID  . chlorhexidine  15 mL Mouth Rinse BID  . ferrous sulfate  300 mg Per Tube BID WC  . heparin subcutaneous  5,000 Units Subcutaneous Q8H  . methylPREDNISolone (SOLU-MEDROL) injection  40 mg Intravenous Q12H   Continuous Infusions:    . sodium chloride 20 mL/hr at 04/11/12 [address], LDN, CNSC [phone] Pager [phone] After Hours Pager

## 2012-04-11 NOTE — Progress Notes (Signed)
eLink Physician-Brief Progress Note Patient Name: Megan Espinoza DOB: 1955/12/24 MRN: [id number]  Date of Service  04/11/2012   HPI/Events of Note   K 2.8 from 5AM 04/11/2012 No K supp ordered by prior Elink MD or rounding MD  eICU Interventions  KCL po ordered and 4 runs IV KCL ordered    Intervention Category Major Interventions: Electrolyte abnormality - evaluation and management  Shan Levans 04/11/2012, 4:23 PM

## 2012-04-11 NOTE — Evaluation (Signed)
Clinical/Bedside Swallow Evaluation Patient Details  Name: Megan Espinoza MRN: [id number] Date of Birth: 1955/05/01  Today's Date: 04/11/2012 Time: 1400-1430 SLP Time Calculation (min): 30 min  Past Medical History:  Past Medical History  Diagnosis Date  . Obesity (BMI 30.0-34.9)    Past Surgical History:  Past Surgical History  Procedure Date  . Cholecystectomy   . Hernia repair    HPI:  56 yo female admitted 03/24/2012 fever, dry cough, chills, nausea 2nd to PNA complicated by anemia.  Developed worsening hypoxia 12/08 from ARDS and transfer to ICU with PCCM assuming care.  On 12/09 developed VDRF due to ARDS.  Found to have influenza A positive. Pt was intubated from 12/9 to 12/19, then reintubated on 12/19 to the 21st but required BiPAP post extubation.    Assessment / Plan / Recommendation Clinical Impression  Pt presents with an acute reversible dysphagia with signs consistent with decreased airway protection (aphonia, weak cough, immediate signs of aspiration with thin and nectar thick consistencies). With trials of puree, graham cracker and honey thick liquids there are no immediate signs of aspiration and risk is decreased. Instructed pt to swallow 3 times with each bolus to reduce possible residual. Full supervision required for management of O2 mask and to monitor for tolerance. If any increased coughing is observed, make pt NPO. SLP will f/u in am. Pt may need FEES if tolerance of current diet is in question.     Aspiration Risk  Moderate    Diet Recommendation Dysphagia 3 (Mechanical Soft);Honey-thick liquid   Liquid Administration via: Cup;No straw Medication Administration: Whole meds with puree Supervision: Full supervision/cueing for compensatory strategies Compensations: Slow rate;Small sips/bites;Multiple dry swallows after each bite/sip Postural Changes and/or Swallow Maneuvers: Seated upright 90 degrees;Upright 30-60 min after meal    Other   Recommendations Oral Care Recommendations: Oral care BID Other Recommendations: Order thickener from pharmacy   Follow Up Recommendations       Frequency and Duration min 2x/week  2 weeks   Pertinent Vitals/Pain NA    SLP Swallow Goals Patient will consume recommended diet without observed clinical signs of aspiration with: Moderate assistance Patient will utilize recommended strategies during swallow to increase swallowing safety with: Minimal cueing Goal #3: Pt will consume trials of nectar thick/thin liquids with no overt signs of aspiration to determine readiness for diet upgrade.    Swallow Study Prior Functional Status       General HPI: 56 yo female admitted 03/24/2012 fever, dry cough, chills, nausea 2nd to PNA complicated by anemia.  Developed worsening hypoxia 12/08 from ARDS and transfer to ICU with PCCM assuming care.  On 12/09 developed VDRF due to ARDS.  Found to have influenza A positive. Pt was intubated from 12/9 to 12/19, then reintubated on 12/19 to the 21st but required BiPAP post extubation.  Type of Study: Bedside swallow evaluation Diet Prior to this Study: NPO Respiratory Status: Supplemental O2 delivered via (comment) History of Recent Intubation: Yes Length of Intubations (days): 13 days Date extubated: 04/09/12 Behavior/Cognition: Alert;Cooperative;Pleasant mood Oral Cavity - Dentition: Adequate natural dentition Self-Feeding Abilities: Able to feed self Patient Positioning: Upright in bed Baseline Vocal Quality: Aphonic Volitional Cough: Weak Volitional Swallow: Able to elicit    Oral/Motor/Sensory Function Overall Oral Motor/Sensory Function: Appears within functional limits for tasks assessed   Ice Chips     Thin Liquid Thin Liquid: Impaired Presentation: Cup;Self Fed Pharyngeal  Phase Impairments: Cough - Immediate    Nectar Thick Nectar Thick Liquid: Impaired  Presentation: Cup;Self Fed Pharyngeal Phase Impairments: Cough - Delayed   Honey  Thick Honey Thick Liquid: Within functional limits Presentation: Cup;Self fed   Puree Puree: Within functional limits Presentation: Self Fed;Spoon   Solid   GO    Solid: Within functional limits      [facility] MA CCC-SLP [phone]  Dianely Krehbiel, Riley Nearing 04/11/2012,2:41 PM

## 2012-04-11 NOTE — Progress Notes (Signed)
PULMONARY  / CRITICAL CARE MEDICINE  Name: Megan Espinoza MRN: [id number] DOB: 1956-01-27    LOS: 18 Date of admit: 03/24/2012  7:37 PM   REFERRING MD :  Elizabeth Palau  CHIEF COMPLAINT:  Respiratory failure  BRIEF PATIENT DESCRIPTION:  56 yo female admitted 03/24/2012 fever, dry cough, chills, nausea 2nd to PNA complicated by anemia.  Developed worsening hypoxia 12/08 from ARDS and transfer to ICU with PCCM assuming care.  On 12/09 developed VDRF due to ARDS.  Found to have influenza A positive.   SIGNIFICANT EVENTS/TESTS:  12/06 CT chest >>diffuse b/l patchy ASD with air bronchograms 12/08 Transfer to ICU 12/09 Bronchoscopy>>minimal secretions, normal airways, blood tinged BAL 12/09 RF, Ant-DS DNA, SCL-70 Ab, HIV negative/non-reactive 12/09 VDRF, ARDS, nimbex started, steroids started 12/10 Transfuse PRBC, Pressors started 12/11 Nimbex stopped 12/17 Added scheduled haldol for agitated delirium 12/21 Extubated, required BiPAP for stridor  LEVEL OF CARE:  ICU PRIMARY SERVICE:  PCCM CONSULTANTS:  None CODE STATUS: full DIET:  NPO DVT Px:  SCDs GI Px:  Pepcid   LINES / TUBES: ETT 12/09 >>12/19, 12/19 >> 12/21 Rt [place] CVL 12/09 >>   CULTURES: MRSA screen 12/5 >>negative Blood 12/5 >>negative Urine (clean catch) 12/5 >> diphtheroids (deemed contaminant)  Legionella Ag 12/09 >>negative Strep Ag 12/09 >>negtive Pneumocystis DFA 12/09 >>negative AFB smear 12/09 >>negative Fungal smear/cx 12/09 >>negative Legionella cx 12/09 >>negative resp virus panel 12/09 >> Influenza A POSITIVE  Resp 12/09 >>negative C diff PCR 12/14 >> negative C Diff PCR 12/19 >> NEGATIVE   ANTIBIOTICS: Aztreonam 12/5 >> 12/11  oseltamivir 12/08 >> 12/15 TMP/SMX 12/09 >> 12/10  Levofloxacin 12/5 >> 12/12  vanco 12/5 >> 12/12  Po flagyl 12/29 (empiric c diff) >>12/20 Aztreonam 12/22>>12/23 Vancomycin 12/22>>12/23   SUBJECTIVE: Not needing BiPAP.  Maintaining SpO2 with  ventimask.  Feels hungry.  VITAL SIGNS: Temp:  [97.8 F (36.6 C)-99 F (37.2 C)] 98.9 F (37.2 C) (12/23 0833) Pulse Rate:  [90-125] 114  (12/23 0500) Resp:  [21-30] 27  (12/23 0500) BP: (102-171)/(42-106) 154/74 mmHg (12/23 0500) SpO2:  [89 %-98 %] 96 % (12/23 0853) FiO2 (%):  [40 %-50 %] 50 % (12/23 0853) Weight:  [252 lb 6.8 oz (114.5 kg)] 252 lb 6.8 oz (114.5 kg) (12/23 0200)  HEMODYNAMICS:   VENTILATOR SETTINGS: Vent Mode:  [-]  FiO2 (%):  [40 %-50 %] 50 %  INTAKE / OUTPUT: Intake/Output      12/22 0701 - 12/23 0700 12/23 0701 - 12/24 0700   I.V. (mL/kg) 512.7 (4.5) 40 (0.3)   NG/GT     IV Piggyback 904    Total Intake(mL/kg) 1416.7 (12.4) 40 (0.3)   Urine (mL/kg/hr) 1380 (0.5) 30   Stool 50    Total Output 1430 30   Net -13.3 +10        Urine Occurrence 2 x    Stool Occurrence 5 x      PHYSICAL EXAMINATION: Gen: No distress HEENT: No sinus tenderness PULM: faint stridor, b/l faint wheeze, scattered rhonchi CV: s1s2 regular, tachycardic Ab: BS+, soft, nontender Ext: no edema Neuro: Alert, follows commands, moves all extremities   LABS: Cbc  Lab 04/11/12 0520 04/10/12 0550 04/08/12 2344  WBC 10.9* -- --  HGB 8.5* 7.8* 7.1*  HCT 28.9* 26.9* 24.5*  PLT 370 333 311    Chemistry   Lab 04/11/12 0520 04/10/12 0550 04/09/12 0445 04/08/12 0530 04/06/12 0423  NA 148* 149* 152* -- --  K 2.8* 3.4*  3.3* -- --  CL 109 109 116* -- --  CO2 27 30 27  -- --  BUN 44* 43* 63* -- --  CREATININE 1.09 1.20* 1.29* -- --  CALCIUM 9.5 9.3 8.8 -- --  MG -- -- -- 2.1 2.1  PHOS -- -- -- 4.6 3.3  GLUCOSE 166* 119* 114* -- --    Liver fxn No results found for this basename: AST:3,ALT:3,ALKPHOS:3,BILITOT:3,PROT:3,ALBUMIN:3 in the last 168 hours coags No results found for this basename: APTT:3,INR:3 in the last 168 hours Sepsis markers No results found for this basename: LATICACIDVEN:3,PROCALCITON:3 in the last 168 hours Cardiac markers No results found for this  basename: CKTOTAL:3,CKMB:3,TROPONINI:3 in the last 168 hours BNP  Lab 04/06/12 0423  PROBNP 420.9*   ABG  Lab 04/07/12 0500  PHART 7.431  PCO2ART 39.7  PO2ART 71.8*  HCO3 25.6*  TCO2 26.8    CBG trend No results found for this basename: GLUCAP:5 in the last 168 hours  Radiology:  Dg Chest Port 1 View  04/11/2012  *RADIOLOGY REPORT*  Clinical Data: Influenza.  Pneumonia.  PORTABLE CHEST - 1 VIEW  Comparison: One-view chest 12/21/ 2013.  Findings: The heart is enlarged.  A diffuse interstitial and airspace pattern is again noted.  Aeration is improved at the left base.  A left effusion is suspected.  The patient has been extubated.  The right subclavian line is stable.  The NG tube was removed.  IMPRESSION:  1.  Status post extubation. 2.  Slight improved aeration at the left base. 3.  Persistent interstitial and airspace disease likely representing edema, worse on the left.   Original Report Authenticated By: Marin Roberts, M.D.      DIAGNOSES: Principal Problem:  *Acute respiratory failure with hypoxia Active Problems:  Microcytic anemia  Obesity (BMI 30.0-34.9)  ARDS (adult respiratory distress syndrome)  Steroid-induced hyperglycemia  Acute renal insufficiency  Influenza A Hypernatremia Hypokalemia Agitation Stridor  ASSESSMENT / PLAN:  Acute respiratory failure with ARDS 2nd to Influenza pneumonia and hypervolemia. Completed course of tamiflu.  Improved with diuresis. Post-extubation stridor, bronchospasm. Improved with BiPAP, nebulizer tx and solumedrol.  Doubt infection. Plan: Keep in even to negative fluid balance F/u CXR intermittently Adjust oxygen to keep SpO2 > 92% BiPAP prn Change solumedrol to 40 mg q12h BDs on schedule D/c vancomycin, aztrenonam 12/23  Septic shock 2nd to PNA.  Resolved.  Hypertension, Sinus tachycardia.  Likely related to agitation, respiratory distress. Improved 12/16.  Hypervolemia. Improved. Plan: Lopressor  PRN Hold lasix 12/23  Acute renal failure in setting of septic shock.  Resolved Hypernatremia.  Likely from diuresis. Plan: F/u BMET Keep in even to negative fluid balance.  Nutrition.  Dysphagia. Diarrhea. Likely from tube feeds>>resolved. Plan: Speech therapy to assess swallowing, then advance diet as tolerated.  Iron deficiency anemia. No evidence for bleeding.  Ferritin 94, Iron < 10 from 12/05. Plan: Continue FeSO4 when able to swallow F/u CBC Resume SQ heparin for DVT prophylaxis 12/23  Agitated delirium 2nd to sepsis. Resolved. Deconditioning 2nd to sepsis. Plan: OOB to chair PT/OT evaluation  Rash in perineum 2nd to moisture associated skin damage. Plan: Per WOC>>Zinc based barrier, thick layers applied frequently, after episode of incontinence clean away stool and reapply thick layer of the zinc    Coralyn Helling, MD [facility] Pulmonary/Critical Care 04/11/2012, 9:55 AM Pager:  [phone] After 3pm call: [phone]

## 2012-04-12 ENCOUNTER — Encounter (HOSPITAL_COMMUNITY): Payer: Self-pay | Admitting: Pulmonary Disease

## 2012-04-12 DIAGNOSIS — R5381 Other malaise: Secondary | ICD-10-CM

## 2012-04-12 LAB — BASIC METABOLIC PANEL
BUN: 46 mg/dL — ABNORMAL HIGH (ref 6–23)
CO2: 27 mEq/L (ref 19–32)
Chloride: 114 mEq/L — ABNORMAL HIGH (ref 96–112)
Creatinine, Ser: 1.15 mg/dL — ABNORMAL HIGH (ref 0.50–1.10)
GFR calc Af Amer: 60 mL/min — ABNORMAL LOW (ref >90)
Glucose, Bld: 203 mg/dL — ABNORMAL HIGH (ref 70–99)
Potassium: 3.2 mEq/L — ABNORMAL LOW (ref 3.5–5.1)

## 2012-04-12 LAB — CBC
HCT: 28.1 % — ABNORMAL LOW (ref 36.0–46.0)
Hemoglobin: 8.5 g/dL — ABNORMAL LOW (ref 12.0–15.0)
MCH: 21.1 pg — ABNORMAL LOW (ref 26.0–34.0)
MCHC: 30.2 g/dL (ref 30.0–36.0)
MCV: 69.9 fL — ABNORMAL LOW (ref 78.0–100.0)
RDW: 27.1 % — ABNORMAL HIGH (ref 11.5–15.5)

## 2012-04-12 MED ORDER — ALBUTEROL SULFATE (5 MG/ML) 0.5% IN NEBU
2.5000 mg | INHALATION_SOLUTION | Freq: Two times a day (BID) | RESPIRATORY_TRACT | Status: DC
  Administered 2012-04-12 – 2012-04-13 (×2): 2.5 mg via RESPIRATORY_TRACT
  Filled 2012-04-12 (×2): qty 0.5

## 2012-04-12 MED ORDER — FERROUS SULFATE 325 (65 FE) MG PO TABS
325.0000 mg | ORAL_TABLET | Freq: Two times a day (BID) | ORAL | Status: DC
  Administered 2012-04-12 – 2012-04-14 (×4): 325 mg via ORAL
  Filled 2012-04-12 (×6): qty 1

## 2012-04-12 MED ORDER — ALPRAZOLAM 0.25 MG PO TABS
0.2500 mg | ORAL_TABLET | Freq: Four times a day (QID) | ORAL | Status: DC | PRN
  Administered 2012-04-12 – 2012-04-14 (×5): 0.25 mg via ORAL
  Filled 2012-04-12 (×5): qty 1

## 2012-04-12 MED ORDER — METHYLPREDNISOLONE SODIUM SUCC 40 MG IJ SOLR
20.0000 mg | Freq: Two times a day (BID) | INTRAMUSCULAR | Status: DC
  Administered 2012-04-12 – 2012-04-13 (×2): 20 mg via INTRAVENOUS
  Filled 2012-04-12 (×5): qty 0.5

## 2012-04-12 MED ORDER — POTASSIUM CHLORIDE CRYS ER 20 MEQ PO TBCR
40.0000 meq | EXTENDED_RELEASE_TABLET | Freq: Once | ORAL | Status: DC
  Filled 2012-04-12: qty 2

## 2012-04-12 MED ORDER — STARCH (THICKENING) PO POWD
ORAL | Status: DC | PRN
  Filled 2012-04-12 (×2): qty 227

## 2012-04-12 NOTE — Progress Notes (Signed)
PULMONARY  / CRITICAL CARE MEDICINE  Name: Megan Espinoza MRN: [id number] DOB: Apr 16, 1956    LOS: 19 Date of admit: 03/24/2012  7:37 PM   REFERRING MD :  Elizabeth Palau  CHIEF COMPLAINT:  Respiratory failure  BRIEF PATIENT DESCRIPTION:  56 yo female admitted 03/24/2012 fever, dry cough, chills, nausea 2nd to PNA complicated by anemia.  Developed worsening hypoxia 12/08 from ARDS and transfer to ICU with PCCM assuming care.  On 12/09 developed VDRF due to ARDS.  Found to have influenza A positive.   SIGNIFICANT EVENTS/TESTS:  12/06 CT chest >>diffuse b/l patchy ASD with air bronchograms 12/08 Transfer to ICU 12/09 Bronchoscopy>>minimal secretions, normal airways, blood tinged BAL 12/09 RF, Ant-DS DNA, SCL-70 Ab, HIV negative/non-reactive 12/09 VDRF, ARDS, nimbex started, steroids started 12/10 Transfuse PRBC, Pressors started 12/11 Nimbex stopped 12/17 Added scheduled haldol for agitated delirium 12/21 Extubated, required BiPAP for stridor  LEVEL OF CARE:  ICU PRIMARY SERVICE:  PCCM CONSULTANTS:  None CODE STATUS: full DIET:  D3 diet DVT Px:  SQ heparin GI Px:  Not indicated   LINES / TUBES: ETT 12/09 >>12/19, 12/19 >> 12/21 Rt [place] CVL 12/09 >>   CULTURES: MRSA screen 12/5 >>negative Blood 12/5 >>negative Urine (clean catch) 12/5 >> diphtheroids (deemed contaminant)  Legionella Ag 12/09 >>negative Strep Ag 12/09 >>negtive Pneumocystis DFA 12/09 >>negative AFB smear 12/09 >>negative Fungal smear/cx 12/09 >>negative Legionella cx 12/09 >>negative resp virus panel 12/09 >> Influenza A POSITIVE  Resp 12/09 >>negative C diff PCR 12/14 >> negative C Diff PCR 12/19 >> NEGATIVE   ANTIBIOTICS: Aztreonam 12/5 >> 12/11  oseltamivir 12/08 >> 12/15 TMP/SMX 12/09 >> 12/10  Levofloxacin 12/5 >> 12/12  vanco 12/5 >> 12/12  Po flagyl 12/29 (empiric c diff) >>12/20 Aztreonam 12/22>>12/23 Vancomycin 12/22>>12/23   SUBJECTIVE: Tolerating diet.  Feels better.   Denies chest pain.  Still has intermittent cough.    VITAL SIGNS: Temp:  [98.8 F (37.1 C)-100.7 F (38.2 C)] 99.1 F (37.3 C) (12/24 0400) Pulse Rate:  [87-135] 89  (12/24 0800) Resp:  [19-30] 22  (12/24 0800) BP: (66-169)/(43-101) 131/91 mmHg (12/24 0800) SpO2:  [90 %-97 %] 97 % (12/24 0800) FiO2 (%):  [40 %-50 %] 40 % (12/23 1700)  HEMODYNAMICS:   VENTILATOR SETTINGS: Vent Mode:  [-]  FiO2 (%):  [40 %-50 %] 40 %  INTAKE / OUTPUT: Intake/Output      12/23 0701 - 12/24 0700 12/24 0701 - 12/25 0700   I.V. (mL/kg) 192 (1.7) 260 (2.3)   IV Piggyback 450    Total Intake(mL/kg) 642 (5.6) 260 (2.3)   Urine (mL/kg/hr) 920 (0.3) 30   Stool     Total Output 920 30   Net -278 +230          PHYSICAL EXAMINATION: Gen: No distress HEENT: No sinus tenderness PULM: no stridor, no wheeze, scattered rhonchi CV: s1s2 regular, no murmur Ab: BS+, soft, nontender Ext: no edema Neuro: Alert, follows commands, moves all extremities   LABS: Cbc  Lab 04/12/12 0455 04/11/12 0520 04/10/12 0550  WBC 10.9* -- --  HGB 8.5* 8.5* 7.8*  HCT 28.1* 28.9* 26.9*  PLT 363 370 333    Chemistry   Lab 04/12/12 0455 04/11/12 0520 04/10/12 0550 04/08/12 0530 04/06/12 0423  NA 151* 148* 149* -- --  K 3.2* 2.8* 3.4* -- --  CL 114* 109 109 -- --  CO2 27 27 30  -- --  BUN 46* 44* 43* -- --  CREATININE 1.15*  1.09 1.20* -- --  CALCIUM 9.5 9.5 9.3 -- --  MG -- -- -- 2.1 2.1  PHOS -- -- -- 4.6 3.3  GLUCOSE 203* 166* 119* -- --    Liver fxn No results found for this basename: AST:3,ALT:3,ALKPHOS:3,BILITOT:3,PROT:3,ALBUMIN:3 in the last 168 hours coags No results found for this basename: APTT:3,INR:3 in the last 168 hours Sepsis markers No results found for this basename: LATICACIDVEN:3,PROCALCITON:3 in the last 168 hours Cardiac markers No results found for this basename: CKTOTAL:3,CKMB:3,TROPONINI:3 in the last 168 hours BNP  Lab 04/06/12 0423  PROBNP 420.9*   ABG  Lab 04/07/12 0500   PHART 7.431  PCO2ART 39.7  PO2ART 71.8*  HCO3 25.6*  TCO2 26.8    CBG trend  Lab 04/11/12 0526  GLUCAP 152*    Radiology:  Dg Chest Port 1 View  04/11/2012  *RADIOLOGY REPORT*  Clinical Data: Influenza.  Pneumonia.  PORTABLE CHEST - 1 VIEW  Comparison: One-view chest 12/21/ 2013.  Findings: The heart is enlarged.  A diffuse interstitial and airspace pattern is again noted.  Aeration is improved at the left base.  A left effusion is suspected.  The patient has been extubated.  The right subclavian line is stable.  The NG tube was removed.  IMPRESSION:  1.  Status post extubation. 2.  Slight improved aeration at the left base. 3.  Persistent interstitial and airspace disease likely representing edema, worse on the left.   Original Report Authenticated By: Marin Roberts, M.D.      DIAGNOSES: Principal Problem:  *Acute respiratory failure with hypoxia Active Problems:  Microcytic anemia  Obesity (BMI 30.0-34.9)  ARDS (adult respiratory distress syndrome)  Steroid-induced hyperglycemia  Acute renal insufficiency  Influenza A Hypernatremia Hypokalemia Agitation Stridor  ASSESSMENT / PLAN:  Acute respiratory failure with ARDS 2nd to Influenza pneumonia and hypervolemia. Completed course of tamiflu.  Improved with diuresis. Post-extubation stridor, bronchospasm. Improved with BiPAP, nebulizer tx and solumedrol. Plan: Keep in even fluid balance F/u CXR intermittently Adjust oxygen to keep SpO2 > 92% D/c BiPAP Change solumedrol to 20 mg q12h>>if stable, then can change to prednisone 12/26 and wean off quickly Change BD's to bid, and q3h prn  Septic shock 2nd to PNA.  Resolved.  Hypertension, Sinus tachycardia.  Likely related to agitation, respiratory distress. Improved 12/16.  Hypervolemia. Improved. Plan: Lopressor PRN  Acute renal failure in setting of septic shock.  Resolved Hypernatremia.  Likely from diuresis. Hypokalemia. Plan: F/u BMET Keep in  even fluid balance.  Nutrition.  Dysphagia. Seen by speech therapy 12/23. Diarrhea. Likely from tube feeds>>resolved. Plan: D3 diet  Iron deficiency anemia. No evidence for bleeding.  Ferritin 94, Iron < 10 from 12/05. Plan: Continue FeSO4 F/u CBC Resumed SQ heparin for DVT prophylaxis 12/23  Agitated delirium 2nd to sepsis. Resolved. Deconditioning 2nd to sepsis. Plan: OOB to chair PT/OT  CIR consult  Rash in perineum 2nd to moisture associated skin damage. Plan: Per WOC>>Zinc based barrier, thick layers applied frequently, after episode of incontinence clean away stool and reapply thick layer of the zinc  D/c foley, central line.  Transfer to telemetry.  Keep on PCCM service since likely to transfer to CIR soon.  Coralyn Helling, MD [facility] Pulmonary/Critical Care 04/12/2012, 8:25 AM Pager:  [phone] After 3pm call: [phone]

## 2012-04-12 NOTE — Consult Note (Signed)
Physical Medicine and Rehabilitation Consult Reason for Consult: Deconditioning/respiratory failure Referring Physician: Critical care   HPI: Megan Espinoza is a 56 y.o. right-handed female admitted 03/24/2012 with nonproductive cough and generalized myalgias with increasing shortness of breath and wheezing as well as fever of 102 with mixed chills but no nausea vomiting. Chest x-ray with bilateral airspace opacity suspicious for pneumonia left greater than right. Placed on antibiotic therapy. Noted hemoglobin upon admission was 7.7 and ultimately transfused on 03/29/2012. CT of the chest confirmed patchy infiltrates with associated air bronchograms. Anemia workup consistent with iron deficiency with, low reticulocytes and no evidence of bleeding. On 03/28/2012 develop VDRF due to ARDS with followup per critical care medicine and maintained on steroid protocol. Patient remained intubated until 04/09/2012. Respiratory viral panel positive influenza and change to broad-spectrum antibiotics. Bouts of delirium agitation which has slowly improved suspect encephalopathy. Currently maintained on dysphagia 3 honey thick liquid per followup per speech therapy. Subcutaneous heparin for DVT prophylaxis. Physical therapy evaluation completed 04/11/2012 noted profound deconditioning with recommendations of physical medicine rehabilitation consult to consider inpatient rehabilitation services Vocal volume diminished, hoarse whisper Review of Systems  Constitutional: Positive for fever and chills.  Respiratory: Positive for cough and wheezing.   Musculoskeletal: Positive for myalgias.  Neurological: Positive for weakness.  All other systems reviewed and are negative.   Past Medical History  Diagnosis Date  . Obesity (BMI 30.0-34.9)   . Influenza, pneumonia December 2013    Associated with ARDS, Septic shock   Past Surgical History  Procedure Date  . Cholecystectomy   . Hernia repair    Family  History  Problem Relation Age of Onset  . Hypertension     Social History:  reports that she has never smoked. She does not have any smokeless tobacco history on file. She reports that she drinks alcohol. She reports that she does not use illicit drugs. Allergies:  Allergies  Allergen Reactions  . Penicillins Swelling  . Codeine Nausea And Vomiting   Medications Prior to Admission  Medication Sig Dispense Refill  . Omeprazole (PRILOSEC PO) Take 1 tablet by mouth daily.         Home: Home Living Lives With: Spouse (3 dogs) Available Help at Discharge: Family;Available PRN/intermittently Type of Home: Mobile home Home Access: Stairs to enter Entrance Stairs-Number of Steps: 3 Entrance Stairs-Rails: None Home Layout: One level Bathroom Shower/Tub: Walk-in shower;Tub/shower unit [employer]: Standard Home Adaptive Equipment: Other (comment) (pt has RW, 3in1 and hospital bed that belonged to son)  Functional History: Prior Function Able to Take Stairs?: Yes Driving: Yes Vocation: Full time employment Comments: Pt works for [employer] Functional Status:  Mobility: Bed Mobility Bed Mobility: Rolling Left;Supine to Sit;Sitting - Scoot to [place] of Bed;Sit to Supine;Scooting to [facility] Rolling Left: 4: Min assist;With rail (cueing for sequence) Supine to Sit: 4: Min assist;HOB elevated (HOB 30degrees) Sitting - Scoot to Edge of Bed: 4: Min assist Sit to Supine: 2: Max assist Scooting to [facility]: 1: +2 Total assist Scooting to [facility]: Patient Percentage: 30% Transfers Transfers: Sit to Stand Sit to Stand: 1: +2 Total assist;From bed;From elevated surface Sit to Stand: Patient Percentage: 30% Ambulation/Gait Ambulation/Gait Assistance: Not tested (comment)    ADL:    Cognition: Cognition Arousal/Alertness: Awake/alert Orientation Level: Oriented X4 Cognition Overall Cognitive Status: Appears within functional limits for tasks assessed/performed Arousal/Alertness:  Awake/alert Orientation Level: Appears intact for tasks assessed Behavior During Session: [facility] for tasks performed  Blood pressure 131/91, pulse 89, temperature  99.1 F (37.3 C), temperature source Oral, resp. rate 22, height 5\' 5"  (1.651 m), weight 114.5 kg (252 lb 6.8 oz), SpO2 97.00%. Physical Exam  Vitals reviewed. HENT:  Head: Normocephalic.  Eyes:       Pupils round and reactive to light  Neck: Normal range of motion. Neck supple. No thyromegaly present.  Cardiovascular: Normal rate and regular rhythm.   Pulmonary/Chest:       Decreased breath sounds at the bases with limited inspiratory effort  Abdominal: Soft. Bowel sounds are normal. She exhibits no distension.       Obese  Neurological: She is alert.       Patient was able to name person, date of birth and age. She followed simple commands and name familiar objects. Her voice was very hoarse but intelligible  Skin: Skin is warm and dry.  Motor strength is 4/5 in bilateral deltoid, biceps, triceps, grip, 3 minus hip flexors,Knee extensors, 4 minus ankle dorsiflexors and plantar flexors Sensation is intact to light touch in both upper and lower limbs  Results for orders placed during the hospital encounter of 03/24/12 (from the past 24 hour(s))  BASIC METABOLIC PANEL     Status: Abnormal   Collection Time   04/12/12  4:55 AM      Component Value Range   Sodium 151 (*) 135 - 145 mEq/L   Potassium 3.2 (*) 3.5 - 5.1 mEq/L   Chloride 114 (*) 96 - 112 mEq/L   CO2 27  19 - 32 mEq/L   Glucose, Bld 203 (*) 70 - 99 mg/dL   BUN 46 (*) 6 - 23 mg/dL   Creatinine, Ser 1.61 (*) 0.50 - 1.10 mg/dL   Calcium 9.5  8.4 - 09.6 mg/dL   GFR calc non Af Amer 52 (*) >90 mL/min   GFR calc Af Amer 60 (*) >90 mL/min  CBC     Status: Abnormal   Collection Time   04/12/12  4:55 AM      Component Value Range   WBC 10.9 (*) 4.0 - 10.5 K/uL   RBC 4.02  3.87 - 5.11 MIL/uL   Hemoglobin 8.5 (*) 12.0 - 15.0 g/dL   HCT 04.5 (*) 40.9 - 81.1 %   MCV  69.9 (*) 78.0 - 100.0 fL   MCH 21.1 (*) 26.0 - 34.0 pg   MCHC 30.2  30.0 - 36.0 g/dL   RDW 91.4 (*) 78.2 - 95.6 %   Platelets 363  150 - 400 K/uL   Dg Chest Port 1 View  04/11/2012  *RADIOLOGY REPORT*  Clinical Data: Influenza.  Pneumonia.  PORTABLE CHEST - 1 VIEW  Comparison: One-view chest 12/21/ 2013.  Findings: The heart is enlarged.  A diffuse interstitial and airspace pattern is again noted.  Aeration is improved at the left base.  A left effusion is suspected.  The patient has been extubated.  The right subclavian line is stable.  The NG tube was removed.  IMPRESSION:  1.  Status post extubation. 2.  Slight improved aeration at the left base. 3.  Persistent interstitial and airspace disease likely representing edema, worse on the left.   Original Report Authenticated By: Marin Roberts, M.D.     Assessment/Plan: Diagnosis: Deconditioning after respiratory failure associated with pneumonia 1. Does the need for close, 24 hr/day medical supervision in concert with the patient's rehab needs make it unreasonable for this patient to be served in a less intensive setting? Yes and Potentially 2. 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 Due to bladder management, bowel management, safety, skin/wound care, disease management, medication administration, pain management and patient education, does the patient require 24 hr/day rehab nursing? Yes 4. Does the patient require coordinated care of a physician, rehab nurse, PT (1-2 hrs/day, 55 days/week), OT (1-2 hrs/day, 5 days/week) and SLP (0.5-1 hrs/day, 5 days/week) to address physical and functional deficits in the context of the above medical diagnosis(es)? Potentially Addressing deficits in the following areas: balance, endurance, locomotion, strength, transferring, bowel/bladder control, bathing, dressing, feeding,  grooming, toileting and speechAnd swallowing 5. Can the patient actively participate in an intensive therapy program of at least 3 hrs of therapy per day at least 5 days per week? Potentially 6. The potential for patient to make measurable gains while on inpatient rehab is good 7. Anticipated functional outcomes upon discharge from inpatient rehab are Supervision mobility with PT, Supervision ADLs with OT, Normal liquids and textures safe swallowing, improve vocal volume with SLP. 8. Estimated rehab length of stay to reach the above functional goals is: 2 weeks 9. Does the patient have adequate social supports to accommodate these discharge functional goals? Potentially 10. Anticipated D/C setting: Home 11. Anticipated post D/C treatments: HH therapy 12. Overall Rehab/Functional Prognosis: good  RECOMMENDATIONS: This patient's condition is appropriate for continued rehabilitative care in the following setting: CIR Patient has agreed to participate in recommended program. Potentially Note that insurance prior authorization may be required for reimbursement for recommended care.  Comment:Should be ready in next 48 hours assuming she remains medically stable    04/12/2012

## 2012-04-12 NOTE — Evaluation (Signed)
Occupational Therapy Evaluation Patient Details Name: Megan Espinoza MRN: [id number] DOB: 1955/11/30 Today's Date: 04/12/2012 Time: 0930-1008 OT Time Calculation (min): 38 min  OT Assessment / Plan / Recommendation Clinical Impression  Pleasant 56 yr old female admitted with PNA, ARDS, and the Flu.  currently presents with severely limited endurance and overall independence with basic selfcare tasks.  Feel she will benefit from acute OT services to help increase overall independence and endurance in order to return home with family.  Feel pt will benefit from follow-up CIR level therapies prior to returning home.    OT Assessment  Patient needs continued OT Services    Follow Up Recommendations  CIR    Barriers to Discharge None    Equipment Recommendations  3 in 1 bedside comode;Tub/shower seat       Frequency  Min 2X/week    Precautions / Restrictions Precautions Precautions: Fall Precaution Comments: oxygen Restrictions Weight Bearing Restrictions: No   Pertinent Vitals/Pain HR 115 at rest increasing to 144 with activity, O2 sats at rest 92% on 5Ls decreased to 85% with activity     ADL  Eating/Feeding: Simulated;Independent Where Assessed - Eating/Feeding: Edge of bed Grooming: Performed;Set up;Supervision/safety Where Assessed - Grooming: Unsupported sitting Upper Body Bathing: Simulated;Set up Where Assessed - Upper Body Bathing: Unsupported sitting Lower Body Bathing: Simulated;+2 Total assistance Lower Body Bathing: Patient Percentage: 40% Where Assessed - Lower Body Bathing: Supported sit to stand Upper Body Dressing: Simulated;Set up Where Assessed - Upper Body Dressing: Unsupported sitting Lower Body Dressing: Simulated;+2 Total assistance Lower Body Dressing: Patient Percentage: 40% Where Assessed - Lower Body Dressing: Supported sit to stand Toilet Transfer: Simulated;+2 Total assistance Toilet Transfer: Patient Percentage: 40% Statistician  Method: Surveyor, minerals: Other (comment) (to bedside chair) Toileting - Clothing Manipulation and Hygiene: Performed;+2 Total assistance Toileting - Clothing Manipulation and Hygiene: Patient Percentage: 40% Where Assessed - Toileting Clothing Manipulation and Hygiene: Sit to stand from 3-in-1 or toilet Tub/Shower Transfer Method: Not assessed Transfers/Ambulation Related to ADLs: Pt overall total +2 pt 40% for sit to stand and for pivot transfer to bed. ADL Comments: Pt with HR increasing to 144 BPM and O2 sats decreasing to 85% with activity.  Only able to tolerate standing for approximately 30 seconds on 4 attempts then needed to sit and rest for 2-4 mins.  O2 sats around 88% after recovering in sitting position.  Demonstrates ability to reach her feet for donning socks but with increased effort and increased time.  Will benefit from AE for energy conservation.    OT Diagnosis: Generalized weakness  OT Problem List: Decreased strength;Decreased activity tolerance;Cardiopulmonary status limiting activity;Decreased knowledge of use of DME or AE;Impaired balance (sitting and/or standing) OT Treatment Interventions: Self-care/ADL training;Therapeutic activities;Energy conservation;DME and/or AE instruction;Balance training;Patient/family education;Therapeutic exercise   OT Goals Acute Rehab OT Goals OT Goal Formulation: With patient Time For Goal Achievement: 04/26/12 Potential to Achieve Goals: Good ADL Goals Pt Will Perform Upper Body Bathing: with supervision;Unsupported;Sitting, edge of bed ADL Goal: Upper Body Bathing - Progress: Goal set today Pt Will Perform Lower Body Bathing: with mod assist;Sit to stand from bed;with adaptive equipment ADL Goal: Lower Body Bathing - Progress: Goal set today Pt Will Perform Upper Body Dressing: with set-up;Unsupported;Sitting, bed ADL Goal: Upper Body Dressing - Progress: Goal set today Pt Will Perform Lower Body Dressing: with  mod assist;with adaptive equipment;Sit to stand from bed ADL Goal: Lower Body Dressing - Progress: Goal set today Pt Will Transfer to Toilet:  with mod assist;with DME;Extra wide 3-in-1 ADL Goal: Toilet Transfer - Progress: Goal set today Pt Will Perform Toileting - Clothing Manipulation: with mod assist;Sitting on 3-in-1 or toilet;Standing ADL Goal: Toileting - Clothing Manipulation - Progress: Goal set today Pt Will Perform Toileting - Hygiene: with mod assist;Sit to stand from 3-in-1/toilet ADL Goal: Toileting - Hygiene - Progress: Goal set today Miscellaneous OT Goals Miscellaneous OT Goal #1: Pt will increased bilateral shoulder strength to 3+/5 for greater use with selfcare tasks. OT Goal: Miscellaneous Goal #1 - Progress: Goal set today Miscellaneous OT Goal #2: Pt will perform bed mobility supine to sit EOB with supervision in preparation for selfcare tasks. OT Goal: Miscellaneous Goal #2 - Progress: Goal set today  Visit Information  Last OT Received On: 04/12/12 Assistance Needed: +2 PT/OT Co-Evaluation/Treatment: Yes    Subjective Data  Subjective: I want to go back to work. Patient Stated Goal: Pt wants to be able to get back to work.   Prior Functioning     Home Living Lives With: Spouse (3 dogs) Available Help at Discharge: Family;Available PRN/intermittently Type of Home: Mobile home Home Access: Stairs to enter Entrance Stairs-Number of Steps: 3 Entrance Stairs-Rails: None Home Layout: One level Bathroom Shower/Tub: Walk-in shower;Tub/shower unit Bathroom Toilet: Standard Home Adaptive Equipment: Other (comment) (pt has RW, 3in1 and hospital bed that belonged to son) Prior Function Level of Independence: Independent Able to Take Stairs?: Yes Driving: Yes Vocation: Full time employment Communication Communication: Other (comment) (soft spoken and difficult to hear over venturi mask) Dominant Hand: Right         Vision/Perception Vision -  Assessment Eye Alignment: Within Functional Limits Vision Assessment: Vision not tested Perception Perception: Within Functional Limits Praxis Praxis: Intact   Cognition  Overall Cognitive Status: Appears within functional limits for tasks assessed/performed Arousal/Alertness: Awake/alert Orientation Level: Appears intact for tasks assessed Behavior During Session: [facility] for tasks performed    Extremity/Trunk Assessment Right Upper Extremity Assessment RUE ROM/Strength/Tone: Deficits RUE ROM/Strength/Tone Deficits: shoulder strength 3/5, elbow flexion and extension 3+/5, grip also 3+/5 RUE Sensation: WFL - Light Touch RUE Coordination: WFL - gross/fine motor Left Upper Extremity Assessment LUE ROM/Strength/Tone: Deficits LUE ROM/Strength/Tone Deficits: shoulder strength 3/5, elbow flexion and extension 3+/5, grip also 3+/5 LUE Sensation: WFL - Light Touch LUE Coordination: WFL - gross/fine motor Trunk Assessment Trunk Assessment: Normal     Mobility Bed Mobility Bed Mobility: Supine to Sit Rolling Left: 4: Min assist Supine to Sit: 4: Min assist;HOB flat Sit to Supine: 3: Mod assist;HOB flat Details for Bed Mobility Assistance: Assist to translate trunk anterior over BOS with pt able to translate bilateral LEs to EOB. Cues for sequence. Assist to slow descent of trunk to bed an move bilateral LEs back onto bed when returning supine. Transfers Transfers: Sit to Stand Sit to Stand: With upper extremity assist;1: +1 Total assist;From bed Sit to Stand: Patient Percentage: 40% Stand to Sit: 1: +2 Total assist;To bed;With upper extremity assist Stand to Sit: Patient Percentage: 40% Details for Transfer Assistance: Pt performed 4 trials of sit to stand in order to complete perianal hygiene and work on endurance.  She was able to maintain staning for 5- 30 seconds with each attempt.           Balance Balance Balance Assessed: Yes Static Sitting Balance Static Sitting - Balance  Support: Right upper extremity supported;Left upper extremity supported Static Sitting - Level of Assistance: 5: Stand by assistance Static Standing Balance Static Standing - Balance Support:  Right upper extremity supported;Left upper extremity supported Static Standing - Level of Assistance: 1: +2 Total assist;Patient percentage (comment);Other (comment) (pt 40 %) Static Standing - Comment/# of Minutes: Pt stood x 4 trials for 30 seconds each. Limited by fatigue. Cues for tall posture with facilitation for extension through trunk.   End of Session OT - End of Session Equipment Utilized During Treatment: Gait belt Activity Tolerance: Patient limited by fatigue Patient left: in bed;with call bell/phone within reach Nurse Communication: Mobility status     Shatona Andujar OTR/L Pager number [phone] 04/12/2012, 10:29 AM

## 2012-04-12 NOTE — Progress Notes (Signed)
Speech Language Pathology Dysphagia Treatment Patient Details Name: Megan Espinoza MRN: [id number] DOB: 1955/10/08 Today's Date: 04/12/2012 Time: 4098-1191 SLP Time Calculation (min): 12 min  Assessment / Plan / Recommendation Clinical Impression  Pt presents with persisting dysphagia with symptoms of overt aspiration associated with trials of thin liquids; toleration of honey-thick liquids much better.  Continues with increased RR, aphonia, and decreased management of secretions, all of which contribute to her aspiration risk.  Upon entering room, noted near-empty cup of unthickened Coke at bedside, which pt confirmed she consumed.  Informed RN so that precautions can be shared with nursing staff.  SLP will continue to follow for improvements/intervention.    Diet Recommendation  Continue with Current Diet: Dysphagia 3 (mechanical soft);Honey-thick liquid    SLP Plan Continue with current plan of care   Pertinent Vitals/Pain No pain   Swallowing Goals  SLP Swallowing Goals Patient will consume recommended diet without observed clinical signs of aspiration with: Moderate assistance Swallow Study Goal #1 - Progress: Progressing toward goal Patient will utilize recommended strategies during swallow to increase swallowing safety with: Minimal cueing Swallow Study Goal #2 - Progress: Progressing toward goal Goal #3: Pt will consume trials of nectar thick/thin liquids with no overt signs of aspiration to determine readiness for diet upgrade.  Swallow Study Goal #3 - Progress: Progressing toward goal  General Temperature Spikes Noted: No Respiratory Status: Other (comment) (nasal cannula) Behavior/Cognition: Alert;Cooperative Oral Cavity - Dentition: Adequate natural dentition Patient Positioning: Upright in bed  Oral Cavity - Oral Hygiene Brush patient's teeth BID with toothbrush (using toothpaste with fluoride): Yes Patient is mechanically ventilated, follow VAP prevention  protocol for oral care: Oral care provided every 4 hours   Dysphagia Treatment Treatment focused on: Skilled observation of diet tolerance;Upgraded PO texture trials;Patient/family/caregiver education Treatment Methods/Modalities: Skilled observation;Differential diagnosis Patient observed directly with PO's: Yes Type of PO's observed: Thin liquids;Honey-thick liquids Feeding: Able to feed self Liquids provided via: Cup;Teaspoon Pharyngeal Phase Signs & Symptoms: Suspected delayed swallow initiation;Multiple swallows;Immediate cough (cough with thin liquids) Type of cueing: Verbal Amount of cueing: Moderate   GO    Clinton Dragone L. Samson Frederic, [place] CCC/SLP Pager [phone]  Blenda Mounts Laurice 04/12/2012, 4:45 PM

## 2012-04-12 NOTE — PMR Pre-admission (Signed)
PMR Admission Coordinator Pre-Admission Assessment  Patient: Megan Espinoza is an 56 y.o., female MRN: [id number] DOB: [birth year] Height: 5\' 5"  (165.1 cm) Weight: 114.5 kg (252 lb 6.8 oz)              Insurance Information HMO:     PPO:      PCP:      IPA:      80/20:      OTHER:  PRIMARY: self pay       I left voicemail on 12/24 for financial counselor, Tamala Bari, [phone]. To contact pt at bedside, per pt request for disability and medicaid applications. I also gave her Alinda Money and Victorino Dike as contacts to assist. Patient admitted 03/24/12.  Medicaid Application Date: tba      Case Manager: tba Disability Application Date: tba      Case Worker: tba  Emergency Contact Information Contact Information    Name Relation Home Work Mobile   [place] Significant other [phone]     Fowler,Jennifer Daughter   [phone]   Randye Lobo [phone]       Current Medical History  Patient Admitting Diagnosis:Deconditioning after respiratory failure associated with pneumonia and influenza A  History of Present Illness: HPI: Megan Espinoza is a 56 y.o. right-handed female admitted 03/24/2012 with nonproductive cough and generalized myalgias with increasing shortness of breath and wheezing as well as fever of 102 with mixed chills but no nausea vomiting. Chest x-ray with bilateral airspace opacity suspicious for pneumonia left greater than right.  Placed on antibiotic therapy. Noted hemoglobin upon admission was 7.7 and ultimately transfused on 03/29/2012. CT of the chest confirmed patchy infiltrates with associated air bronchograms. Anemia workup consistent with iron deficiency with, low reticulocytes and no evidence of bleeding. On 03/28/2012 develop VDRF due to ARDS with followup per critical care medicine and maintained on steroid protocol. Patient remained intubated until 04/09/2012. Respiratory viral panel positive influenza and change to broad-spectrum antibiotics. Bouts of  delirium agitation which has slowly improved suspect encephalopathy. Currently maintained on dysphagia 3 honey thick liquid per followup per speech therapy. Subcutaneous heparin for DVT prophylaxis. Has completed course of Tamiflu.  Past Medical History  Past Medical History  Diagnosis Date  . Obesity (BMI 30.0-34.9)   . Influenza, pneumonia December 2013    Associated with ARDS, Septic shock    Family History  family history includes Hypertension in an unspecified family member.  Prior Rehab/Hospitalizations:none  Current Medications  Current facility-administered medications:acetaminophen (TYLENOL) tablet 650 mg, 650 mg, Oral, Q6H PRN, Coralyn Helling, MD;  albuterol (PROVENTIL) (5 MG/ML) 0.5% nebulizer solution 2.5 mg, 2.5 mg, Nebulization, Q3H PRN, Merwyn Katos, MD;  albuterol (PROVENTIL) (5 MG/ML) 0.5% nebulizer solution 2.5 mg, 2.5 mg, Nebulization, BID, Coralyn Helling, MD;  ALPRAZolam Prudy Feeler) tablet 0.25 mg, 0.25 mg, Oral, Q6H PRN, Coralyn Helling, MD antiseptic oral rinse (BIOTENE) solution 15 mL, 15 mL, Mouth Rinse, QID, Merwyn Katos, MD, 15 mL at 04/12/12 1636;  chlorhexidine (PERIDEX) 0.12 % solution 15 mL, 15 mL, Mouth Rinse, BID, Coralyn Helling, MD, 15 mL at 04/12/12 0759;  fentaNYL (SUBLIMAZE) injection 12.5-25 mcg, 12.5-25 mcg, Intravenous, Q2H PRN, Merwyn Katos, MD, 25 mcg at 04/10/12 0316;  ferrous sulfate tablet 325 mg, 325 mg, Oral, BID WC, Coralyn Helling, MD, 325 mg at 04/12/12 1636 heparin injection 5,000 Units, 5,000 Units, Subcutaneous, Q8H, Coralyn Helling, MD, 5,000 Units at 04/12/12 1321;  methylPREDNISolone sodium succinate (SOLU-MEDROL) 40 mg/mL injection 20 mg, 20 mg, Intravenous, Q12H, Vineet Sood,  MD;  metoprolol (LOPRESSOR) injection 2.5-5 mg, 2.5-5 mg, Intravenous, Q3H PRN, Merwyn Katos, MD, 2.5 mg at 04/11/12 0448;  potassium chloride SA (K-DUR,KLOR-CON) CR tablet 40 mEq, 40 mEq, Oral, Once, Storm Frisk, MD potassium chloride SA (K-DUR,KLOR-CON) CR tablet 40 mEq, 40  mEq, Oral, Once, Merwyn Katos, MD;  sodium chloride 0.9 % injection 3 mL, 3 mL, Intravenous, PRN, Gery Pray, MD  Patients Current Diet: Dysphagia 3 diet with honey thick liquids  Precautions / Restrictions Precautions Precautions: Fall Precaution Comments: oxygen Restrictions Weight Bearing Restrictions: No   Prior Activity Level Community (5-7x/wk): 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 Home Assistive Devices/Equipment: None Home Adaptive Equipment: Other (comment) (pt has RW, 3in1 and hospital bed that belonged to son)  Prior Functional Level Prior Function Level of Independence: Independent Able to Take Stairs?: Yes Driving: Yes Vocation: Part time employment Comments: Pt works for [employer]  Current Functional Level Cognition  Arousal/Alertness: Awake/alert Overall Cognitive Status: Appears within functional limits for tasks assessed/performed Orientation Level: Oriented X4    Extremity Assessment (includes Sensation/Coordination)  RUE ROM/Strength/Tone: Deficits RUE ROM/Strength/Tone Deficits: shoulder strength 3/5, elbow flexion and extension 3+/5, grip also 3+/5 RUE Sensation: WFL - Light Touch RUE Coordination: WFL - gross/fine motor  RLE ROM/Strength/Tone: Deficits RLE ROM/Strength/Tone Deficits: grossly 2/5 did not formally assess secondary to respiratory status    ADLs  Eating/Feeding: Simulated;Independent Where Assessed - Eating/Feeding: Edge of bed Grooming: Performed;Set up;Supervision/safety Where Assessed - Grooming: Unsupported sitting Upper Body Bathing: Simulated;Set up Where Assessed - Upper Body Bathing: Unsupported sitting Lower Body Bathing: Simulated;+2 Total assistance Lower Body Bathing: Patient Percentage: 40% Where Assessed - Lower Body Bathing: Supported sit to stand Upper Body Dressing: Simulated;Set up Where Assessed - Upper Body Dressing: Unsupported sitting Lower  Body Dressing: Simulated;+2 Total assistance Lower Body Dressing: Patient Percentage: 40% Where Assessed - Lower Body Dressing: Supported sit to stand Toilet Transfer: Simulated;+2 Total assistance Toilet Transfer: Patient Percentage: 40% Statistician Method: Surveyor, minerals: Other (comment) (to bedside chair) Toileting - Clothing Manipulation and Hygiene: Performed;+2 Total assistance Toileting - Clothing Manipulation and Hygiene: Patient Percentage: 40% Where Assessed - Toileting Clothing Manipulation and Hygiene: Sit to stand from 3-in-1 or toilet Tub/Shower Transfer Method: Not assessed Transfers/Ambulation Related to ADLs: Pt overall total +2 pt 40% for sit to stand and for pivot transfer to bed. ADL Comments: Pt with HR increasing to 144 BPM and O2 sats decreasing to 85% with activity.  Only able to tolerate standing for approximately 30 seconds on 4 attempts then needed to sit and rest for 2-4 mins.  O2 sats around 88% after recovering in sitting position.  Demonstrates ability to reach her feet for donning socks but with increased effort and increased time.  Will benefit from AE for energy conservation.    Mobility  Bed Mobility: Supine to Sit Rolling Left: 4: Min assist Supine to Sit: 4: Min assist;HOB flat Sitting - Scoot to Edge of Bed: 4: Min assist Sit to Supine: 3: Mod assist;HOB flat Scooting to HOB: 1: +2 Total assist Scooting to [facility]: Patient Percentage: 30%    Transfers  Transfers: Sit to Stand;Stand to [employer] Transfers (4 trials for sit to stand.) Sit to Stand: With upper extremity assist;1: +1 Total assist;From bed Sit to Stand: Patient Percentage: 40% Stand to Sit: 1: +2 Total assist;To bed;With upper extremity assist Stand to Sit: Patient Percentage: 40%  Stand Pivot Transfers: 1: +2 Total assist Stand Pivot Transfers: Patient Percentage: 40%    Ambulation / Gait / Stairs / Wheelchair Mobility  Ambulation/Gait Ambulation/Gait  Assistance: Not tested (comment) Stairs: No Wheelchair Mobility Wheelchair Mobility: No    Posture / Games developer Sitting - Balance Support: Right upper extremity supported;Left upper extremity supported Static Sitting - Level of Assistance: 5: Stand by assistance Static Sitting - Comment/# of Minutes: 15 Static Standing Balance Static Standing - Balance Support: Right upper extremity supported;Left upper extremity supported Static Standing - Level of Assistance: 1: +2 Total assist;Patient percentage (comment);Other (comment) (pt 40 %) Static Standing - Comment/# of Minutes: Pt stood x 4 trials for 30 seconds each. Limited by fatigue. Cues for tall posture with facilitation for extension through trunk.    Special needs/care consideration BiPAP/CPAP no CPM no Continuous Drip IV no  Dialysis no        Days no Life Vest no Oxygen - PRN Special Bed no Trach Size no Wound Vac (area)no      Location no Skin perineum and skin breakdown due to moisture caused by diarrhea. Some partial thickness skin loss with peeling of the inner thighs. Standard of care per WOC is use of zinc based barrier, thick layers applied frequently, after episodes of incontinenc clean away stool and reapply thick layers of the zinc. Bowel mgmt: - Incontinent at times. LBM 12/26 Bladder mgmt: WNL Diabetic mgmt: n/a   Previous Home Environment Living Arrangements:  (Melvin BF of 12 years) Lives With: Significant other Available Help at Discharge:  Alinda Money works. ) Type of Home: Mobile home Home Layout: One level Home Access: Stairs to enter Entrance Stairs-Rails: None Entrance Stairs-Number of Steps: 3 Bathroom Shower/Tub: Psychologist, counselling;Tub/shower unit Teacher, early years/pre: No Home Care Services: No  Discharge Living Setting Plans for Discharge Living Setting: Mobile Home Type of Home at Discharge: Mobile home Discharge Home Layout: One level Discharge  Home Access: Stairs to enter Entrance Stairs-Rails: None Entrance Stairs-Number of Steps: 3 steps Discharge Bathroom Shower/Tub: Walk-in shower;Tub/shower unit Discharge Bathroom Toilet: Standard Discharge Bathroom Accessibility: No Do you have any problems obtaining your medications?: No  Social/Family/Support Systems Patient Roles: Partner;Parent (employee) Contact Information: Cristela Blue, BF of 12 years Anticipated Caregiver: Alinda Money when not working and daughter, Victorino Dike Anticipated Industrial/product designer Information: [phone] Ability/Limitations of Caregiver: Alinda Money works Engineer, structural Availability: Intermittent Discharge Plan Discussed with Primary Caregiver: No Is Caregiver In Agreement with Plan?: Yes Does Caregiver/Family have Issues with Lodging/Transportation while Pt is in Rehab?: No  Goals/Additional Needs Patient/Family Goal for Rehab: supervision PT and OT, safe pos with SLP Expected length of stay: ELOS 2 to 3 weeks Dietary Needs: Dysphagia 3 diet with honey thick liquids Special Service Needs: Pt's son, Barbara Cower ( 37 year old) died since pt admitted. Pt very emotional and grieving. Pt's daughter, Toni Amend , is 47 years old, bipolar, has been hospitalized during this admission also. Alinda Money and daughter, Victorino Dike , are who she relies on. Pt requesting POA papers to be completed with Alinda Money as POA. Pt/Family Agrees to Admission and willing to participate: Yes Program Orientation Provided & Reviewed with Pt/Caregiver Including Roles  & Responsibilities: Yes  Decrease burden of Care through IP rehab admission: not applicable  Possible need for SNF placement upon discharge: no  Patient Condition: Please see physician update to information in consult dated 04/12/12.  Preadmission Screen Completed By:  Clois Dupes, 04/12/2012 5:25 PM ______________________________________________________________________   Discussed status with Dr. Bryson Dames  on12/26/13 at 2:25 PM  and received telephone approval for admission today.  Admission Coordinator:  Lutricia Horsfall, time 2:25 PM/Date12/26/13

## 2012-04-12 NOTE — Progress Notes (Signed)
I have contacted financial counselor per pt request for disability and medicaid applications, Megan Espinoza , at [phone].  [phone]

## 2012-04-12 NOTE — Progress Notes (Signed)
Physical Therapy Treatment Patient Details Name: Megan Espinoza MRN: [id number] DOB: [birth year] Today's Date: 04/12/2012 Time: 0930-1011 PT Time Calculation (min): 41 min  PT Assessment / Plan / Recommendation Comments on Treatment Session  Pt admitted with CAP/flu and is motivated to work with therapy. Pt able to increase activity tolerance today with increased ability to participate in sit to stand as well as stand pivot transfer.    Follow Up Recommendations  CIR     Does the patient have the potential to tolerate intense rehabilitation     Barriers to Discharge        Equipment Recommendations  Rolling walker with 5" wheels    Recommendations for Other Services OT consult;Rehab consult  Frequency Min 3X/week   Plan Discharge plan remains appropriate;Frequency remains appropriate    Precautions / Restrictions Precautions Precautions: Fall Precaution Comments: oxygen Restrictions Weight Bearing Restrictions: No   Pertinent Vitals/Pain None    Mobility  Bed Mobility Bed Mobility: Supine to Sit Rolling Left: 4: Min assist Supine to Sit: 4: Min assist;HOB flat Sit to Supine: 3: Mod assist;HOB flat Details for Bed Mobility Assistance: Assist to translate trunk anterior over BOS with pt able to translate bilateral LEs to EOB. Cues for sequence. Assist to slow descent of trunk to bed an move bilateral LEs back onto bed when returning supine. Transfers Transfers: Sit to Stand;Stand to [employer] Transfers (4 trials for sit to stand.) Sit to Stand: With upper extremity assist;1: +1 Total assist;From bed Sit to Stand: Patient Percentage: 40% Stand to Sit: 1: +2 Total assist;To bed;With upper extremity assist Stand to Sit: Patient Percentage: 40% Stand Pivot Transfers: 1: +2 Total assist Stand Pivot Transfers: Patient Percentage: 40% Details for Transfer Assistance: Pt performed 4 trials of sit to stand in order to complete perianal hygiene and work on endurance.   She was able to maintain staning for 5- 30 seconds with each attempt. Ambulation/Gait Ambulation/Gait Assistance: Not tested (comment) Stairs: No Wheelchair Mobility Wheelchair Mobility: No    Exercises     PT Diagnosis:    PT Problem List:   PT Treatment Interventions:     PT Goals Acute Rehab PT Goals PT Goal Formulation: With patient Time For Goal Achievement: 04/25/12 Potential to Achieve Goals: Fair PT Goal: Supine/Side to Sit - Progress: Progressing toward goal PT Goal: Sit to Supine/Side - Progress: Progressing toward goal PT Goal: Sit to Stand - Progress: Progressing toward goal PT Goal: Stand to Sit - Progress: Progressing toward goal PT Transfer Goal: Bed to Chair/Chair to Bed - Progress: Progressing toward goal  Visit Information  Last PT Received On: 04/12/12 Assistance Needed: +2 PT/OT Co-Evaluation/Treatment: Yes    Subjective Data  Subjective: "I can try a little." Patient Stated Goal: return home and to work   Cognition  Overall Cognitive Status: Appears within functional limits for tasks assessed/performed Arousal/Alertness: Awake/alert Orientation Level: Appears intact for tasks assessed Behavior During Session: [facility] for tasks performed    Balance  Balance Balance Assessed: Yes Static Sitting Balance Static Sitting - Balance Support: Right upper extremity supported;Left upper extremity supported Static Sitting - Level of Assistance: 5: Stand by assistance Static Standing Balance Static Standing - Balance Support: Right upper extremity supported;Left upper extremity supported Static Standing - Level of Assistance: 1: +2 Total assist;Patient percentage (comment);Other (comment) (pt 40 %) Static Standing - Comment/# of Minutes: Pt stood x 4 trials for 30 seconds each. Limited by fatigue. Cues for tall posture with facilitation for extension through trunk.  End of Session PT - End of Session Equipment Utilized During Treatment: Gait belt Activity  Tolerance: Patient limited by fatigue Patient left: in bed;with call bell/phone within reach Nurse Communication: Mobility status   GP     Cephus Shelling 04/12/2012, 10:22 AM  04/12/2012 Cephus Shelling, PT, DPT [phone]

## 2012-04-12 NOTE — Progress Notes (Signed)
I met with patient at bedside. She will benefit from an inpt rehab admission prior to d/c home with boyfriend of 12 years. ? Thursday if felt medically ready. Patient very emotional for her 56 year old son,  Cower, has died since her admission. Grieving that he is really deceased. Emotional support offered. Please clarify when droplett precautions can be discontinued. We will follow up on THursday. [phone]

## 2012-04-13 LAB — BASIC METABOLIC PANEL
BUN: 42 mg/dL — ABNORMAL HIGH (ref 6–23)
CO2: 27 mEq/L (ref 19–32)
Calcium: 9.4 mg/dL (ref 8.4–10.5)
Creatinine, Ser: 1.04 mg/dL (ref 0.50–1.10)
GFR calc non Af Amer: 59 mL/min — ABNORMAL LOW (ref >90)
Glucose, Bld: 132 mg/dL — ABNORMAL HIGH (ref 70–99)
Sodium: 150 mEq/L — ABNORMAL HIGH (ref 135–145)

## 2012-04-13 LAB — CBC
MCH: 20.6 pg — ABNORMAL LOW (ref 26.0–34.0)
MCHC: 29.3 g/dL — ABNORMAL LOW (ref 30.0–36.0)
MCV: 70.4 fL — ABNORMAL LOW (ref 78.0–100.0)
Platelets: 291 10*3/uL (ref 150–400)
RBC: 4.22 MIL/uL (ref 3.87–5.11)

## 2012-04-13 MED ORDER — ARFORMOTEROL TARTRATE 15 MCG/2ML IN NEBU
15.0000 ug | INHALATION_SOLUTION | Freq: Two times a day (BID) | RESPIRATORY_TRACT | Status: DC
  Administered 2012-04-13 – 2012-04-14 (×2): 15 ug via RESPIRATORY_TRACT
  Filled 2012-04-13 (×5): qty 2

## 2012-04-13 MED ORDER — PANTOPRAZOLE SODIUM 40 MG PO TBEC
40.0000 mg | DELAYED_RELEASE_TABLET | Freq: Two times a day (BID) | ORAL | Status: DC
  Administered 2012-04-13 – 2012-04-14 (×4): 40 mg via ORAL
  Filled 2012-04-13 (×2): qty 1

## 2012-04-13 MED ORDER — PREDNISONE 20 MG PO TABS
20.0000 mg | ORAL_TABLET | Freq: Two times a day (BID) | ORAL | Status: DC
  Administered 2012-04-13 – 2012-04-14 (×2): 20 mg via ORAL
  Filled 2012-04-13 (×4): qty 1

## 2012-04-13 MED ORDER — BUDESONIDE 0.5 MG/2ML IN SUSP
0.5000 mg | Freq: Two times a day (BID) | RESPIRATORY_TRACT | Status: DC
  Administered 2012-04-13 – 2012-04-14 (×2): 0.5 mg via RESPIRATORY_TRACT
  Filled 2012-04-13 (×5): qty 2

## 2012-04-13 MED ORDER — POTASSIUM CHLORIDE CRYS ER 20 MEQ PO TBCR
40.0000 meq | EXTENDED_RELEASE_TABLET | Freq: Once | ORAL | Status: AC
  Administered 2012-04-13: 40 meq via ORAL

## 2012-04-13 MED ORDER — POTASSIUM CHLORIDE CRYS ER 20 MEQ PO TBCR
EXTENDED_RELEASE_TABLET | ORAL | Status: AC
  Filled 2012-04-13: qty 2

## 2012-04-13 NOTE — Progress Notes (Signed)
PULMONARY  / CRITICAL CARE MEDICINE  Name: BELEN PESCH MRN: [id number] DOB: Apr 14, 1956    LOS: 20 Date of admit: 03/24/2012  7:37 PM   REFERRING MD :  Elizabeth Palau  CHIEF COMPLAINT:  Respiratory failure  BRIEF PATIENT DESCRIPTION:  56 yo female admitted 03/24/2012 fever, dry cough, chills, nausea 2nd to PNA complicated by anemia.  Developed worsening hypoxia 12/08 from ARDS and transfer to ICU with PCCM assuming care.  On 12/09 developed VDRF due to ARDS.  Found to be influenza A positive.   SIGNIFICANT EVENTS/TESTS:  12/06 CT chest >>diffuse b/l patchy ASD with air bronchograms 12/08 Transfer to ICU 12/09 Bronchoscopy>>minimal secretions, normal airways, blood tinged BAL 12/09 RF, Ant-DS DNA, SCL-70 Ab, HIV negative/non-reactive 12/09 VDRF, ARDS, nimbex started, steroids started 12/10 Transfuse PRBC, Pressors started 12/11 Nimbex stopped 12/17 Added scheduled haldol for agitated delirium 12/21 Extubated, required BiPAP for stridor 12/24 Tx to floor   LEVEL OF CARE:  Floor PRIMARY SERVICE:  PCCM CONSULTANTS:  None CODE STATUS: full DIET:  D3 diet DVT Px:  SQ heparin GI Px: max ppi due to upper airway issues post extubation   LINES / TUBES: ETT 12/09 >>12/19, 12/19 >> 12/21 Rt [place] CVL 12/09 >12/24  CULTURES: MRSA screen 12/5 >>negative Blood 12/5 >>negative Urine (clean catch) 12/5 >> diphtheroids (deemed contaminant)  Legionella Ag 12/09 >>negative Strep Ag 12/09 >>negtive Pneumocystis DFA 12/09 >>negative AFB smear 12/09 >>negative Fungal smear/cx 12/09 >>negative Legionella cx 12/09 >>negative resp virus panel 12/09 >> Influenza A POSITIVE  Resp 12/09 >>negative C diff PCR 12/14 >> negative C Diff PCR 12/19 >> neg   ANTIBIOTICS: Aztreonam 12/5 >> 12/11  oseltamivir 12/08 >> 12/15 TMP/SMX 12/09 >> 12/10  Levofloxacin 12/5 >> 12/12  vanco 12/5 >> 12/12  Po flagyl 12/29 (empiric c diff) >>12/20 Aztreonam 12/22>>12/23 Vancomycin  12/22>>12/23   SUBJECTIVE: Up in chair with component of pseudwheeze    VITAL SIGNS: Temp:  [98.4 F (36.9 C)-99 F (37.2 C)] 98.4 F (36.9 C) (12/25 0800) Pulse Rate:  [85-112] 93  (12/25 0800) Resp:  [18-24] 18  (12/25 0800) BP: (148-176)/(76-91) 165/82 mmHg (12/25 0800) SpO2:  [91 %-97 %] 97 % (12/25 0948) FIO2 :   4lpm      INTAKE / OUTPUT: Intake/Output      12/24 0701 - 12/25 0700 12/25 0701 - 12/26 0700   P.O.  360   I.V. (mL/kg) 540 (4.7)    IV Piggyback     Total Intake(mL/kg) 540 (4.7) 360 (3.1)   Urine (mL/kg/hr) 30 (0)    Total Output 30    Net +510 +360        Urine Occurrence 3 x 2 x     PHYSICAL EXAMINATION: Gen: No distress HEENT: No sinus tenderness PULM: insp and exp rhonchi with upper airway wheeze CV: s1s2 regular, no murmur Ab: BS+, soft, nontender Ext: no edema Neuro: Alert, follows commands, moves all extremities   LABS: Cbc  Lab 04/13/12 0500 04/12/12 0455 04/11/12 0520  WBC 8.0 -- --  HGB 8.7* 8.5* 8.5*  HCT 29.7* 28.1* 28.9*  PLT 291 363 370    Chemistry   Lab 04/13/12 0500 04/12/12 0455 04/11/12 0520 04/08/12 0530  NA 150* 151* 148* --  K 3.4* 3.2* 2.8* --  CL 112 114* 109 --  CO2 27 27 27  --  BUN 42* 46* 44* --  CREATININE 1.04 1.15* 1.09 --  CALCIUM 9.4 9.5 9.5 --  MG -- -- -- 2.1  PHOS -- -- -- 4.6  GLUCOSE 132* 203* 166* --    Liver fxn No results found for this basename: AST:3,ALT:3,ALKPHOS:3,BILITOT:3,PROT:3,ALBUMIN:3 in the last 168 hours coags No results found for this basename: APTT:3,INR:3 in the last 168 hours Sepsis markers No results found for this basename: LATICACIDVEN:3,PROCALCITON:3 in the last 168 hours Cardiac markers No results found for this basename: CKTOTAL:3,CKMB:3,TROPONINI:3 in the last 168 hours BNP No results found for this basename: PROBNP:3 in the last 168 hours ABG  Lab 04/07/12 0500  PHART 7.431  PCO2ART 39.7  PO2ART 71.8*  HCO3 25.6*  TCO2 26.8    CBG trend  Lab  04/11/12 0526  GLUCAP 152*    Radiology:  No results found.   DIAGNOSES: Principal Problem:  *Acute respiratory failure with hypoxia Active Problems:  Microcytic anemia  Obesity (BMI 30.0-34.9)  ARDS (adult respiratory distress syndrome)  Steroid-induced hyperglycemia  Acute renal insufficiency  Influenza A Hypernatremia Hypokalemia Agitation Stridor  ASSESSMENT / PLAN:  Acute respiratory failure with ARDS 2nd to Influenza pneumonia and hypervolemia. Completed course of tamiflu.  Improved with diuresis. Post-extubation stridor, bronchospasm. Improved, now off  BiPAP as of 12/24 Plan: Keep in even fluid balance  Adjust oxygen to keep SpO2 > 92%  Changed BD's to bid brovana and bud 12/25  Plus  q3h prn  Septic shock 2nd to PNA.  Resolved.  Hypertension, Sinus tachycardia.  Likely related to agitation, respiratory distress. Improved 12/16.  Hypervolemia. Improved. Plan: Lopressor PRN  Acute renal failure in setting of septic shock.  Resolved Hypernatremia.  Likely from diuresis. Hypokalemia. Plan: F/u BMET Keep in even fluid balance Rx hypokalemia with po supplements  Nutrition.  Dysphagia. Seen by speech therapy 12/24, notes reviewed> no change rx. Diarrhea. Likely from tube feeds>>resolved. Plan: D3 diet  Iron deficiency anemia. No evidence for bleeding.  Ferritin 94, Iron < 10 from 12/05. Plan: Continue FeSO4 F/u CBC Resumed SQ heparin for DVT prophylaxis 12/23  Agitated delirium 2nd to sepsis. Resolved. Deconditioning 2nd to sepsis. Plan: OOB to chair PT/OT  CIR consult  Rash in perineum 2nd to moisture associated skin damage. Plan: Per WOC>>Zinc based barrier, thick layers applied frequently, after episode of incontinence clean away stool and reapply thick layer of the zinc     Sandrea Hughs, MD Pulmonary and Critical Care Medicine [facility] Cell [phone]

## 2012-04-14 ENCOUNTER — Encounter (HOSPITAL_COMMUNITY): Payer: Self-pay | Admitting: *Deleted

## 2012-04-14 ENCOUNTER — Inpatient Hospital Stay (HOSPITAL_COMMUNITY)
Admission: RE | Admit: 2012-04-14 | Discharge: 2012-04-23 | DRG: 945 | Disposition: A | Discharge status: Home Health | Payer: MEDICAID | Source: Ambulatory Visit | Attending: Physical Medicine & Rehabilitation | Admitting: Physical Medicine & Rehabilitation

## 2012-04-14 DIAGNOSIS — J9600 Acute respiratory failure, unspecified whether with hypoxia or hypercapnia: Secondary | ICD-10-CM

## 2012-04-14 DIAGNOSIS — F411 Generalized anxiety disorder: Secondary | ICD-10-CM

## 2012-04-14 DIAGNOSIS — Z5189 Encounter for other specified aftercare: Principal | ICD-10-CM

## 2012-04-14 DIAGNOSIS — R5381 Other malaise: Secondary | ICD-10-CM

## 2012-04-14 DIAGNOSIS — L299 Pruritus, unspecified: Secondary | ICD-10-CM

## 2012-04-14 DIAGNOSIS — Z79899 Other long term (current) drug therapy: Secondary | ICD-10-CM

## 2012-04-14 DIAGNOSIS — J189 Pneumonia, unspecified organism: Secondary | ICD-10-CM

## 2012-04-14 DIAGNOSIS — R1310 Dysphagia, unspecified: Secondary | ICD-10-CM

## 2012-04-14 DIAGNOSIS — D509 Iron deficiency anemia, unspecified: Secondary | ICD-10-CM

## 2012-04-14 DIAGNOSIS — G4700 Insomnia, unspecified: Secondary | ICD-10-CM

## 2012-04-14 DIAGNOSIS — E669 Obesity, unspecified: Secondary | ICD-10-CM

## 2012-04-14 MED ORDER — ARFORMOTEROL TARTRATE 15 MCG/2ML IN NEBU: 15.0000 ug | INHALATION_SOLUTION | Freq: Two times a day (BID) | RESPIRATORY_TRACT | Status: DC

## 2012-04-14 MED ORDER — CHLORHEXIDINE GLUCONATE 0.12 % MT SOLN
15.0000 mL | Freq: Two times a day (BID) | OROMUCOSAL | Status: DC
  Administered 2012-04-15 – 2012-04-20 (×6): 15 mL via OROMUCOSAL
  Filled 2012-04-14 (×18): qty 15

## 2012-04-14 MED ORDER — HEPARIN SODIUM (PORCINE) 5000 UNIT/ML IJ SOLN: 5000.0000 [IU] | Freq: Three times a day (TID) | INTRAMUSCULAR | Status: DC

## 2012-04-14 MED ORDER — PREDNISONE 20 MG PO TABS
20.0000 mg | ORAL_TABLET | Freq: Two times a day (BID) | ORAL | Status: DC
  Administered 2012-04-14 – 2012-04-23 (×18): 20 mg via ORAL
  Filled 2012-04-14 (×20): qty 1

## 2012-04-14 MED ORDER — STARCH (THICKENING) PO POWD: ORAL | Status: DC

## 2012-04-14 MED ORDER — ALBUTEROL SULFATE (5 MG/ML) 0.5% IN NEBU: 2.5000 mg | INHALATION_SOLUTION | RESPIRATORY_TRACT | Status: DC | PRN

## 2012-04-14 MED ORDER — ACETAMINOPHEN 325 MG PO TABS: 650.0000 mg | ORAL_TABLET | Freq: Four times a day (QID) | ORAL | Status: DC | PRN

## 2012-04-14 MED ORDER — ONDANSETRON HCL 4 MG PO TABS: 4.0000 mg | ORAL_TABLET | Freq: Four times a day (QID) | ORAL | Status: DC | PRN

## 2012-04-14 MED ORDER — PREDNISONE 20 MG PO TABS: 20.0000 mg | ORAL_TABLET | Freq: Two times a day (BID) | ORAL | Status: DC

## 2012-04-14 MED ORDER — ALPRAZOLAM 0.25 MG PO TABS
0.2500 mg | ORAL_TABLET | Freq: Four times a day (QID) | ORAL | Status: DC | PRN
  Administered 2012-04-14 – 2012-04-22 (×15): 0.25 mg via ORAL
  Filled 2012-04-14 (×16): qty 1

## 2012-04-14 MED ORDER — FERROUS SULFATE 325 (65 FE) MG PO TABS
325.0000 mg | ORAL_TABLET | Freq: Two times a day (BID) | ORAL | Status: DC
Start: 2012-04-14 — End: 2012-04-23
  Administered 2012-04-14 – 2012-04-23 (×18): 325 mg via ORAL
  Filled 2012-04-14 (×20): qty 1

## 2012-04-14 MED ORDER — ACETAMINOPHEN 325 MG PO TABS
325.0000 mg | ORAL_TABLET | ORAL | Status: DC | PRN
  Administered 2012-04-18 – 2012-04-23 (×7): 650 mg via ORAL
  Filled 2012-04-14 (×7): qty 2

## 2012-04-14 MED ORDER — PANTOPRAZOLE SODIUM 40 MG PO TBEC
40.0000 mg | DELAYED_RELEASE_TABLET | Freq: Two times a day (BID) | ORAL | Status: DC
Start: 2012-04-15 — End: 2012-04-23
  Administered 2012-04-15 – 2012-04-23 (×17): 40 mg via ORAL
  Filled 2012-04-14 (×18): qty 1

## 2012-04-14 MED ORDER — DIPHENHYDRAMINE HCL 25 MG PO CAPS
25.0000 mg | ORAL_CAPSULE | Freq: Three times a day (TID) | ORAL | Status: DC | PRN
  Administered 2012-04-14 – 2012-04-16 (×4): 25 mg via ORAL
  Filled 2012-04-14 (×4): qty 1

## 2012-04-14 MED ORDER — HEPARIN SODIUM (PORCINE) 5000 UNIT/ML IJ SOLN
5000.0000 [IU] | Freq: Three times a day (TID) | INTRAMUSCULAR | Status: DC
  Administered 2012-04-14 – 2012-04-20 (×17): 5000 [IU] via SUBCUTANEOUS
  Filled 2012-04-14 (×20): qty 1

## 2012-04-14 MED ORDER — BUDESONIDE 0.5 MG/2ML IN SUSP
0.5000 mg | Freq: Two times a day (BID) | RESPIRATORY_TRACT | Status: DC
  Administered 2012-04-14 – 2012-04-21 (×13): 0.5 mg via RESPIRATORY_TRACT
  Filled 2012-04-14 (×18): qty 2

## 2012-04-14 MED ORDER — BIOTENE DRY MOUTH MT LIQD
15.0000 mL | Freq: Four times a day (QID) | OROMUCOSAL | Status: DC
  Administered 2012-04-15 – 2012-04-21 (×12): 15 mL via OROMUCOSAL

## 2012-04-14 MED ORDER — SORBITOL 70 % SOLN
30.0000 mL | Freq: Every day | Status: DC | PRN
  Filled 2012-04-14: qty 30

## 2012-04-14 MED ORDER — STARCH (THICKENING) PO POWD
ORAL | Status: DC | PRN
  Filled 2012-04-14: qty 227

## 2012-04-14 MED ORDER — POLYETHYLENE GLYCOL 3350 17 G PO PACK
17.0000 g | PACK | Freq: Every day | ORAL | Status: DC | PRN
  Filled 2012-04-14: qty 1

## 2012-04-14 MED ORDER — ARFORMOTEROL TARTRATE 15 MCG/2ML IN NEBU
15.0000 ug | INHALATION_SOLUTION | Freq: Two times a day (BID) | RESPIRATORY_TRACT | Status: DC
  Administered 2012-04-14 – 2012-04-21 (×12): 15 ug via RESPIRATORY_TRACT
  Filled 2012-04-14 (×20): qty 2

## 2012-04-14 MED ORDER — BUDESONIDE 0.5 MG/2ML IN SUSP: 0.5000 mg | Freq: Two times a day (BID) | RESPIRATORY_TRACT | Status: DC

## 2012-04-14 MED ORDER — PANTOPRAZOLE SODIUM 40 MG PO TBEC: 40.0000 mg | DELAYED_RELEASE_TABLET | Freq: Two times a day (BID) | ORAL | Status: DC

## 2012-04-14 MED ORDER — FERROUS SULFATE 325 (65 FE) MG PO TABS: 325.0000 mg | ORAL_TABLET | Freq: Two times a day (BID) | ORAL | Status: DC

## 2012-04-14 MED ORDER — ONDANSETRON HCL 4 MG/2ML IJ SOLN: 4.0000 mg | Freq: Four times a day (QID) | INTRAMUSCULAR | Status: DC | PRN

## 2012-04-14 MED ORDER — ALPRAZOLAM 0.25 MG PO TABS: 0.2500 mg | ORAL_TABLET | Freq: Four times a day (QID) | ORAL | Status: DC | PRN

## 2012-04-14 NOTE — Progress Notes (Signed)
Occupational Therapy Treatment Patient Details Name: Megan Espinoza MRN: [id number] DOB: [birth year] Today's Date: 04/14/2012 Time: 4098-1191 OT Time Calculation (min): 13 min  OT Assessment / Plan / Recommendation Comments on Treatment Session Session focused on UE exercises and improving activity tolerance. Pt to be transferred to CIR this afternoon.    Follow Up Recommendations  CIR    Barriers to Discharge       Equipment Recommendations  3 in 1 bedside comode;Tub/shower seat    Recommendations for Other Services    Frequency     Plan      Precautions / Restrictions Precautions Precautions: Fall   Pertinent Vitals/Pain Pt with no reports of pain    ADL  Eating/Feeding: Independent Where Assessed - Eating/Feeding: Chair Grooming: Performed;Set up;Supervision/safety Where Assessed - Grooming: Supported sitting ADL Comments: unable to maintain unsupported sitting longer than ~20sec at a time.     OT Diagnosis:    OT Problem List:   OT Treatment Interventions:     OT Goals Miscellaneous OT Goals Miscellaneous OT Goal #1: Pt will increased bilateral shoulder strength to 3+/5 for greater use with selfcare tasks. OT Goal: Miscellaneous Goal #1 - Progress: Progressing toward goals  Visit Information  Last OT Received On: 04/14/12 Assistance Needed: +2    Subjective Data      Prior Functioning       Cognition  Overall Cognitive Status: Appears within functional limits for tasks assessed/performed Arousal/Alertness: Awake/alert Orientation Level: Appears intact for tasks assessed Behavior During Session: [facility] for tasks performed    Mobility  Shoulder Instructions         Exercises  Other Exercises Other Exercises: horizontal shoulder ab/duction x 7 each arm; 2lb elbow extension/flexion x 10 each arm. pt easily fatigued and required 6 30sec breaks.    Balance     End of Session    GO     Megan Espinoza 04/14/2012, 4:17 PM

## 2012-04-14 NOTE — Progress Notes (Signed)
Rehab admissions - I spoke with patient this am.  She would like inpatient rehab.  We do have beds available on inpatient rehab today.  If okay with MD, can admit to inpatient rehab today.  Call me for questions.  #[phone]

## 2012-04-14 NOTE — Progress Notes (Signed)
Pt arrived to rehab unit at 1605 via wheelchair with husband at bedside. Belongings with pt. Reviewed rehab process and booklet with pt and husband with pt verbalizing understanding with signature of safety plan. No further questions at this time. Call bell within reach.

## 2012-04-14 NOTE — Discharge Summary (Signed)
Physician Discharge Summary     Patient ID: Megan Espinoza MRN: [id number] DOB/AGE: 05/05/1955 56 y.o.  Admit date: 03/24/2012 Discharge date: 04/14/2012  Admission Diagnoses: Acute respiratory distress.   Discharge Diagnoses:  Principal Problem:  *Acute respiratory failure with hypoxia Active Problems:  Microcytic anemia  Obesity (BMI 30.0-34.9)  ARDS (adult respiratory distress syndrome)  Steroid-induced hyperglycemia  Acute renal insufficiency  Influenza A   Significant Hospital tests/ studies/ interventions and procedures  LINES / TUBES:  ETT 12/09 >>12/19, 12/19 >> 12/21  Rt [place] CVL 12/09 >12/24   CULTURES:  MRSA screen 12/5 >>negative  Blood 12/5 >>negative  Urine (clean catch) 12/5 >> diphtheroids (deemed contaminant)  Legionella Ag 12/09 >>negative  Strep Ag 12/09 >>negtive  Pneumocystis DFA 12/09 >>negative  AFB smear 12/09 >>negative  Fungal smear/cx 12/09 >>negative  Legionella cx 12/09 >>negative  resp virus panel 12/09 >> Influenza A POSITIVE  Resp 12/09 >>negative  C diff PCR 12/14 >> negative  C Diff PCR 12/19 >> neg   ANTIBIOTICS:  Aztreonam 12/5 >> 12/11  oseltamivir 12/08 >> 12/15  TMP/SMX 12/09 >> 12/10  Levofloxacin 12/5 >> 12/12  vanco 12/5 >> 12/12  Po flagyl 12/29 (empiric c diff) >>12/20  Aztreonam 12/22>>12/23  Vancomycin 12/22>>12/23  SIGNIFICANT EVENTS/TESTS:  12/06 CT chest >>diffuse b/l patchy ASD with air bronchograms  12/08 Transfer to ICU  12/09 Bronchoscopy>>minimal secretions, normal airways, blood tinged BAL  12/09 RF, Ant-DS DNA, SCL-70 Ab, HIV negative/non-reactive  12/09 VDRF, ARDS, nimbex started, steroids started  12/10 Transfuse PRBC, Pressors started  12/11 Nimbex stopped  12/17 Added scheduled haldol for agitated delirium  12/21 Extubated, required BiPAP for stridor  12/24 Tx to floor   BRIEF PATIENT DESCRIPTION:  56 yo female admitted 03/24/2012 fever, dry cough, chills, nausea 2nd to PNA complicated by  anemia. Developed worsening hypoxia 12/08 from ARDS and transfer to ICU with PCCM assuming care. On 12/09 developed VDRF due to ARDS. Found to be influenza A positive. Hospital Course:  Acute respiratory failure with ARDS 2nd to Influenza pneumonia and hypervolemia.  Post-extubation stridor, bronchospasm.   Admitted initially to medical ward on 12/5. Therapeutic interventions included: oxygen, and empiric antibiotics. She developed worsening aeration and hypoxia on 12/8, for which she was transferred to the ICU. Ultimately developed ARDS as a complication of her influenza and required intubation on 12/9. She was treated with above CAP coverage and tamiflu was started on 12/8. Her course was prolonged requiring neuro muscular blockade to assist w/ ventilator requirements, and steroids given concern for fibro proliferative ARDS. She was ultimately extubated on 12/21. Her antibiotic course is complete. She did have some dyspnea after extubation which was multifactorial: volume overload and post-extubation stridor. Stridor was treated w/ NIPPV, and volume overload treated w/ diuresis. At time of discharge her oxygen has been weaned to 1 liter n/c Recommendations Cont current BD regimen Keep even fluid status Wean FIO2 Wean prednisone to off over next 10-14 days   Septic shock 2nd to PNA. -->Resolved This was treated in the usual fashion which included central access, vaso-active drug infusion, volume resuscitation and source control (treating the pneumonia). This problem is now resolved.   Hypertension, Sinus tachycardia.  Likely related to agitation, respiratory distress. Improved   Volume overload: following aggressive volume resuscitation efforts.  Resolved after diuresis on 12/24  Acute renal failure in setting of septic shock.  Treated in usual fashion which included Early goal directed therapy resuscitation efforts. This is now Resolved.   Hypernatremia.  Likely from diuresis. And  resolving renal injury.  Recommendation Allow PO intake, this should  Regulate Na levels  Diarrhea:  Due to tubefeeds.  Now resolved.    Iron deficiency anemia. No evidence for bleeding.  Ferritin 94, Iron < 10 from 12/05.  Plan:  Continue FeSO4  F/u CBC  Resumed SQ heparin for DVT prophylaxis 12/23  Agitated delirium 2nd to sepsis.  Resolved.  Deconditioning 2nd to sepsis. Needs rehab.   Rash in perineum 2nd to moisture associated skin damage.  Plan:  Per WOC>>Zinc based barrier, thick layers applied frequently, after episode of incontinence clean away stool and reapply thick layer of the zinc   Discharge Exam: BP 163/80  Pulse 89  Temp 98.9 F (37.2 C) (Oral)  Resp 20  Ht 5\' 5"  (1.651 m)  Wt 115.849 kg (255 lb 6.4 oz)  BMI 42.50 kg/m2  SpO2 92% FIO2 1 liter PHYSICAL EXAMINATION:  Gen: No distress  HEENT: No sinus tenderness  PULM: insp and exp rhonchi with upper airway wheeze  CV: s1s2 regular, no murmur  Ab: BS+, soft, nontender  Ext: no edema  Neuro: Alert, follows commands, moves all extremities   Labs at discharge Lab Results  Component Value Date   CREATININE 1.04 04/13/2012   BUN 42* 04/13/2012   NA 150* 04/13/2012   K 3.4* 04/13/2012   CL 112 04/13/2012   CO2 27 04/13/2012   Lab Results  Component Value Date   WBC 8.0 04/13/2012   HGB 8.7* 04/13/2012   HCT 29.7* 04/13/2012   MCV 70.4* 04/13/2012   PLT 291 04/13/2012   Lab Results  Component Value Date   ALT 25 03/31/2012   AST 21 03/31/2012   ALKPHOS 72 03/31/2012   BILITOT 0.2* 03/31/2012   No results found for this basename: INR,  PROTIME    Current radiology studies No results found.  Disposition:  04-Intermediate Care Facility  Discharge Orders    Future Orders Please Complete By Expires   Diet - low sodium heart healthy      Increase activity slowly      Increase activity slowly      Discontinue IV          Medication List     As of 04/14/2012  2:05 PM      START taking these medications         acetaminophen 325 MG tablet   Commonly known as: TYLENOL   Take 2 tablets (650 mg total) by mouth every 6 (six) hours as needed (or Fever >/= 101).      albuterol (5 MG/ML) 0.5% nebulizer solution   Commonly known as: PROVENTIL   Take 0.5 mLs (2.5 mg total) by nebulization every 3 (three) hours as needed for wheezing or shortness of breath.      ALPRAZolam 0.25 MG tablet   Commonly known as: XANAX   Take 1 tablet (0.25 mg total) by mouth every 6 (six) hours as needed for anxiety.      arformoterol 15 MCG/2ML Nebu   Commonly known as: BROVANA   Take 2 mLs (15 mcg total) by nebulization 2 (two) times daily.      benzonatate 100 MG capsule   Commonly known as: TESSALON   Take 1 capsule (100 mg total) by mouth 3 (three) times daily as needed for cough.      budesonide 0.5 MG/2ML nebulizer solution   Commonly known as: PULMICORT   Take 2 mLs (0.5 mg total) by nebulization 2 (  two) times daily.      ferrous sulfate 325 (65 FE) MG tablet   Take 1 tablet (325 mg total) by mouth 2 (two) times daily.      food thickener Powd   Commonly known as: THICK IT   Add to fluid      heparin 5000 UNIT/ML injection   Inject 1 mL (5,000 Units total) into the skin every 8 (eight) hours.      pantoprazole 40 MG tablet   Commonly known as: PROTONIX   Take 1 tablet (40 mg total) by mouth 2 (two) times daily before a meal.      predniSONE 20 MG tablet   Commonly known as: DELTASONE   Take 1 tablet (20 mg total) by mouth 2 (two) times daily with a meal. Wean this off over 10-14 days                              Discharged Condition: {fair Physician Statement:   The Patient was personally examined, the discharge assessment and plan has been personally reviewed and I agree with ACNP Babcock's assessment and plan. > 30 minutes of time have been dedicated to discharge assessment, planning and discharge instructions.    Signed: BABCOCK,PETE 04/14/2012, 2:05 PM  Pt seen and examined on day of discharge and notes / orders reviewed. Appropiate for discharge to rehab and we'll see prn  Sandrea Hughs, MD [phone]

## 2012-04-14 NOTE — Plan of Care (Signed)
Problem: ICU Phase Progression Outcomes Goal: Initial discharge plan identified Outcome: Completed/Met Date Met:  04/14/12 Pt to go to CIR

## 2012-04-14 NOTE — Plan of Care (Signed)
Overall Plan of Care [facility]) Patient Details Name: Megan Espinoza MRN: [id number] DOB: [birth year]  Diagnosis:  Severe deconditioning after pneumonia,AVDRF  Primary Diagnosis:    <principal problem not specified> Co-morbidities: anemia, dysphagia, encephalopahty  Functional Problem List  Patient demonstrates impairments in the following areas: Balance, Bowel, Endurance, Medication Management, Nutrition, Pain, Safety and Skin Integrity  Basic ADL's: grooming, bathing, dressing and toileting Advanced ADL's: simple meal preparation  Transfers:  bed mobility, bed to chair, toilet, tub/shower, car and furniture Locomotion:  ambulation, wheelchair mobility and stairs  Additional Impairments:  Leisure Awareness  Anticipated Outcomes Item Anticipated Outcome  Eating/Swallowing  Supervision   Basic self-care  supervision  Tolieting  supervision  Bowel/bladder Continent of bowel and bladder  Transfers  Mod I basic transfers; min A car  Locomotion  S gait household distances  Communication    Cognition  Supervision   Pain    3 or less  Safety/Judgment  Supervision   Other     Therapy Plan: PT Intensity: Minimum of 1-2 x/day ,45 to 90 minutes PT Frequency: 5 out of 7 days PT Duration Estimated Length of Stay: 2.5 weeks OT Intensity: Minimum of 1-2 x/day, 45 to 90 minutes OT Frequency: 5 out of 7 days OT Duration/Estimated Length of Stay: 2 1/2 weeks SLP Intensity: Minumum of 1-2 x/day, 30 to 90 minutes SLP Frequency: 5 out of 7 days SLP Duration/Estimated Length of Stay: 10-14 days    Team Interventions: Item RN PT OT SLP SW TR Other  Self Care/Advanced ADL Retraining  x x      Neuromuscular Re-Education  x x      Therapeutic Activities  x x      UE/LE Strength Training/ROM  x x      UE/LE Coordination Activities  x x      Visual/Perceptual Remediation/Compensation         DME/Adaptive Equipment Instruction  x x      Therapeutic Exercise  x x x       Balance/Vestibular Training  x x      Patient/Family Education  x x x     Cognitive Remediation/Compensation  x x x     Functional Mobility Training  x x      Ambulation/Gait Training  x x      Stair Training  x       Wheelchair Propulsion/Positioning  x       Functional Tourist information centre manager Reintegration  x x      Dysphagia/[employer]iration Printmaker    x     Speech/Language Facilitation         Bladder Management         Bowel Management         Disease Management/Prevention x        Pain Management x x x      Medication Management x        Skin Care/Wound Management x x       Splinting/Orthotics  x       Discharge Planning  x x x     Psychosocial Support  x x                             Team Discharge Planning: Destination: PT-  ,OT-   , SLP-Home Projected Follow-up: PT- HHPT, OT-   , SLP- HHSLP, Neuropsych consult Projected Equipment Needs: PT- ,  OT-  , SLP-None recommended by SLP Patient/family involved in discharge planning: PT-  ,  OT- , SLP-Patient  MD ELOS: 2 weeks + Medical Rehab Prognosis:  Excellent Assessment: Pt is admitted for CIR therapies. The team will be addressing: Lower extremity strength, range of motion, stamina, balance, functional mobility, safety, adaptive techniques and equipment, ADL's, cognition, swallowing, education for patient and family. Goals are set at supervision to mod I. Pt is quite motivated to return home. Encephalopathy seems to be resolving.    See Team Conference Notes for weekly updates to the plan of care

## 2012-04-14 NOTE — Progress Notes (Signed)
Report called to 4000.  Patient's belongings collected and patient transferred to 66. Megan Espinoza

## 2012-04-14 NOTE — H&P (Signed)
Chief Complaint   Patient presents with   .  weakness  :  HPI: Megan Espinoza is a 56 y.o. right-handed female admitted 03/24/2012 with nonproductive cough and generalized myalgias with increasing shortness of breath and wheezing as well as fever of 102 with mixed chills but no nausea vomiting. Chest x-ray with bilateral airspace opacity suspicious for pneumonia left greater than right.  Placed on antibiotic therapy. Noted hemoglobin upon admission was 7.7 and ultimately transfused on 03/29/2012. CT of the chest confirmed patchy infiltrates with associated air bronchograms. Anemia workup consistent with iron deficiency with, low reticulocytes and no evidence of bleeding. On 03/28/2012 develop VDRF due to ARDS with followup per critical care medicine and maintained on steroid protocol. Patient remained intubated until 04/09/2012. Respiratory viral panel positive influenza and change to broad-spectrum antibiotics. Bouts of delirium agitation which has slowly improved suspect encephalopathy. Currently maintained on dysphagia 3 honey thick liquid per followup per speech therapy. Subcutaneous heparin for DVT prophylaxis. Physical and occupational therapy evaluations completed 04/11/2012 noted profound deconditioning with recommendations of physical medicine rehabilitation consult to consider inpatient rehabilitation services. Patient was felt to be a good candidate for inpatient rehabilitation services was admitted for comprehensive rehabilitation program   Son recently died,Patient requests Xanax to help her cope  Review of Systems  Constitutional: Positive for fever and chills.  Respiratory: Positive for cough and wheezing.  Musculoskeletal: Positive for myalgias.  Neurological: Positive for weakness.  All other systems reviewed and are negative  Past Medical History   Diagnosis  Date   .  Obesity (BMI 30.0-34.9)    .  Influenza, pneumonia  December 2013     Associated with ARDS, Septic shock     Past Surgical History   Procedure  Date   .  Cholecystectomy    .  Hernia repair     Family History   Problem  Relation  Age of Onset   .  Hypertension      Social History: reports that she has never smoked. She does not have any smokeless tobacco history on file. She reports that she drinks alcohol. She reports that she does not use illicit drugs.  Allergies:  Allergies   Allergen  Reactions   .  Penicillins  Swelling   .  Codeine  Nausea And Vomiting    Medications Prior to Admission   Medication  Sig  Dispense  Refill   .  Omeprazole (PRILOSEC PO)  Take 1 tablet by mouth daily.      Home:  Home Living  Lives With: Significant other  Available Help at Discharge: Alinda Money works. )  Type of Home: Mobile home  Home Access: Stairs to enter  [employer] of Steps: 3  Entrance Stairs-Rails: None  Home Layout: One level  Bathroom Shower/Tub: Walk-in shower;Tub/shower unit  Engineer, water: No  Home Adaptive Equipment: Other (comment) (pt has RW, 3in1 and hospital bed that belonged to son)  Functional History:  Prior Function  Able to Take Stairs?: Yes  Driving: Yes  Vocation: Part time employment  Comments: Pt works for [employer]  Functional Status:  Mobility:  Bed Mobility  Bed Mobility: Supine to Sit  Rolling Left: 4: Min assist  Supine to Sit: 4: Min assist;HOB flat  Sitting - Scoot to [place] of Bed: 4: Min assist  Sit to Supine: 3: Mod assist;HOB flat  Scooting to HOB: 1: +2 Total assist  Scooting to [facility]: Patient Percentage: 30%  Transfers  Transfers:  Sit to Stand;Stand to Sit;Stand Pivot Transfers (4 trials for sit to stand.)  Sit to Stand: With upper extremity assist;1: +1 Total assist;From bed  Sit to Stand: Patient Percentage: 40%  Stand to Sit: 1: +2 Total assist;To bed;With upper extremity assist  Stand to Sit: Patient Percentage: 40%  Stand Pivot Transfers: 1: +2 Total assist  Stand Pivot Transfers: Patient  Percentage: 40%  Ambulation/Gait  Ambulation/Gait Assistance: Not tested (comment)  Stairs: No  Wheelchair Mobility  Wheelchair Mobility: No  ADL:  ADL  Eating/Feeding: Simulated;Independent  Where Assessed - Eating/Feeding: Edge of bed  Grooming: Performed;Set up;Supervision/safety  Where Assessed - Grooming: Unsupported sitting  Upper Body Bathing: Simulated;Set up  Where Assessed - Upper Body Bathing: Unsupported sitting  Lower Body Bathing: Simulated;+2 Total assistance  Where Assessed - Lower Body Bathing: Supported sit to stand  Upper Body Dressing: Simulated;Set up  Where Assessed - Upper Body Dressing: Unsupported sitting  Lower Body Dressing: Simulated;+2 Total assistance  Where Assessed - Lower Body Dressing: Supported sit to stand  Toilet Transfer: Simulated;+2 Total assistance  Toilet Transfer Method: Archivist: Other (comment) (to bedside chair)  Tub/Shower Transfer Method: Not assessed  Transfers/Ambulation Related to ADLs: Pt overall total +2 pt 40% for sit to stand and for pivot transfer to bed.  ADL Comments: Pt with HR increasing to 144 BPM and O2 sats decreasing to 85% with activity. Only able to tolerate standing for approximately 30 seconds on 4 attempts then needed to sit and rest for 2-4 mins. O2 sats around 88% after recovering in sitting position. Demonstrates ability to reach her feet for donning socks but with increased effort and increased time. Will benefit from AE for energy conservation.  Cognition:  Cognition  Arousal/Alertness: Awake/alert  Orientation Level: Oriented X4  Cognition  Overall Cognitive Status: Appears within functional limits for tasks assessed/performed  Arousal/Alertness: Awake/alert  Orientation Level: Appears intact for tasks assessed  Behavior During Session: [facility] for tasks performed  Blood pressure 163/80, pulse 89, temperature 98.9 F (37.2 C), temperature source Oral, resp. rate 20, height 5\' 5"   (1.651 m), weight 115.849 kg (255 lb 6.4 oz), SpO2 90.00%.   Physical Exam  Vitals reviewed.  HENT:  Head: Normocephalic.  Eyes:  Pupils round and reactive to light  Neck: Normal range of motion. Neck supple. No thyromegaly present.  Cardiovascular: Normal rate and regular rhythm.  Pulmonary/Chest: Bilateral crackles Decreased breath sounds at the bases with limited inspiratory effort  Abdominal: Soft. Bowel sounds are normal. She exhibits no distension.  Obese  Neurological: She is alert.  Patient was able to name person, date of birth and age. She followed simple commands and name familiar objects. Her voice was very hoarse but intelligible  Skin: Skin is warm and dry.  Motor strength is 4/5 in bilateral deltoid, biceps, triceps, grip, 3 minus hip flexors,Knee extensors, 4 minus ankle dorsiflexors and plantar flexors  Sensation is intact to light touch in both upper and lower limbs  Results for orders placed during the hospital encounter of 03/24/12 (from the past 48 hour(s))   BASIC METABOLIC PANEL Status: Abnormal    Collection Time    04/13/12 5:00 AM   Component  Value  Range  Comment    Sodium  150 (*)  135 - 145 mEq/L     Potassium  3.4 (*)  3.5 - 5.1 mEq/L     Chloride  112  96 - 112 mEq/L     CO2  27  19 - 32 mEq/L     Glucose, Bld  132 (*)  70 - 99 mg/dL     BUN  42 (*)  6 - 23 mg/dL     Creatinine, Ser  1.61  0.50 - 1.10 mg/dL     Calcium  9.4  8.4 - 10.5 mg/dL     GFR calc non Af Amer  59 (*)  >90 mL/min     GFR calc Af Amer  68 (*)  >90 mL/min    CBC Status: Abnormal    Collection Time    04/13/12 5:00 AM   Component  Value  Range  Comment    WBC  8.0  4.0 - 10.5 K/uL     RBC  4.22  3.87 - 5.11 MIL/uL     Hemoglobin  8.7 (*)  12.0 - 15.0 g/dL     HCT  09.6 (*)  04.5 - 46.0 %     MCV  70.4 (*)  78.0 - 100.0 fL     MCH  20.6 (*)  26.0 - 34.0 pg     MCHC  29.3 (*)  30.0 - 36.0 g/dL     RDW  40.9 (*)  81.1 - 15.5 %     Platelets  291  150 - 400 K/uL  PLATELET  COUNT CONFIRMED BY SMEAR    No results found.  Post Admission Physician Evaluation:  1. Functional deficits secondary to Severe deconditioning after the dependent respiratory failure induced by pneumonia. 2. Patient is admitted to receive collaborative, interdisciplinary care between the physiatrist, rehab nursing staff, and therapy team. 3. Patient's level of medical complexity and substantial therapy needs in context of that medical necessity cannot be provided at a lesser intensity of care such as a SNF. 4. Patient has experienced substantial functional loss from his/her baseline which was documented above under the "Functional History" and "Functional Status" headings. Judging by the patient's diagnosis, physical exam, and functional history, the patient has potential for functional progress which will result in measurable gains while on inpatient rehab. These gains will be of substantial and practical use upon discharge in facilitating mobility and self-care at the household level. 5. Physiatrist will provide 24 hour management of medical needs as well as oversight of the therapy plan/treatment and provide guidance as appropriate regarding the interaction of the two. 6. 24 hour rehab nursing will assist with bladder management, bowel management, safety, skin/wound care, disease management, medication administration and patient education and help integrate therapy concepts, techniques,education, etc. 7. PT will assess and treat for: Pre-gait training, gait training, endurance, safety, activity tolerance. Goals are: Supervision to modified independent mobility. 8. OT will assess and treat for: ADLs, cognitive perceptual skills, safety, equipment, endurance, spell activity tolerance. Goals are: Supervision to modified independent with ADLs. 9. SLP will assess and treat for: Swallowing, higher-level cognition. Goals are: Safe by mouth intake adequate calories and fluids. 10. Case Management and Social  Worker will assess and treat for psychological issues and discharge planning. 11. Team conference will be held weekly to assess progress toward goals and to determine barriers to discharge. 12. Patient will receive at least 3 hours of therapy per day at least 5 days per week. 13. ELOS: 2-3 weeks Prognosis: good Medical Problem List and Plan:  1. Deconditioning after respiratory failure with associated pneumonia  2. DVT Prophylaxis/Anticoagulation: Subcutaneous heparin. Monitor platelet counts any signs of bleeding  3. Mood/delirium. Xanax 0.25 mg every 6 hours as needed. Provide emotional support and  positive reinforcement  4. Neuropsych: This patient Is capable of making decisions on his/her own behalf.  5. Dysphagia. Currently on dysphagia 3 honey thick liquids. Monitor for any signs of aspiration as well as hydration. Followup chemistries and may need some nighttime fluids to maintain hydration  6. Obesity. Dietary followup  7. Anemia. Anemia workup consistent with iron deficiency anemia with no evidence of bleeding. Followup CBC and continue iron supplement  04/14/2012

## 2012-04-15 ENCOUNTER — Inpatient Hospital Stay (HOSPITAL_COMMUNITY): Payer: MEDICAID

## 2012-04-15 ENCOUNTER — Inpatient Hospital Stay (HOSPITAL_COMMUNITY): Payer: Self-pay | Admitting: *Deleted

## 2012-04-15 ENCOUNTER — Inpatient Hospital Stay (HOSPITAL_COMMUNITY): Payer: MEDICAID | Admitting: Speech Pathology

## 2012-04-15 ENCOUNTER — Inpatient Hospital Stay (HOSPITAL_COMMUNITY): Payer: Self-pay | Admitting: Occupational Therapy

## 2012-04-15 DIAGNOSIS — R5381 Other malaise: Secondary | ICD-10-CM

## 2012-04-15 LAB — CBC WITH DIFFERENTIAL/PLATELET
Basophils Relative: 0 % (ref 0–1)
Eosinophils Relative: 8 % — ABNORMAL HIGH (ref 0–5)
Hemoglobin: 9.7 g/dL — ABNORMAL LOW (ref 12.0–15.0)
Lymphocytes Relative: 14 % (ref 12–46)
Monocytes Relative: 4 % (ref 3–12)
Neutrophils Relative %: 74 % (ref 43–77)
Platelets: 255 10*3/uL (ref 150–400)
RBC: 4.53 MIL/uL (ref 3.87–5.11)
WBC: 10.8 10*3/uL — ABNORMAL HIGH (ref 4.0–10.5)

## 2012-04-15 LAB — COMPREHENSIVE METABOLIC PANEL
ALT: 44 U/L — ABNORMAL HIGH (ref 0–35)
AST: 29 U/L (ref 0–37)
Albumin: 2.6 g/dL — ABNORMAL LOW (ref 3.5–5.2)
CO2: 26 mEq/L (ref 19–32)
Calcium: 9.3 mg/dL (ref 8.4–10.5)
Chloride: 104 mEq/L (ref 96–112)
GFR calc non Af Amer: 63 mL/min — ABNORMAL LOW (ref >90)
Sodium: 142 mEq/L (ref 135–145)
Total Bilirubin: 0.4 mg/dL (ref 0.3–1.2)

## 2012-04-15 MED ORDER — LOPERAMIDE HCL 2 MG PO CAPS
2.0000 mg | ORAL_CAPSULE | ORAL | Status: DC | PRN
  Administered 2012-04-15 – 2012-04-17 (×3): 2 mg via ORAL
  Filled 2012-04-15 (×3): qty 1

## 2012-04-15 NOTE — Progress Notes (Signed)
Patient information reviewed and entered into eRehab system by Emilian Stawicki, RN, CRRN, PPS Coordinator.  Information including medical coding and functional independence measure will be reviewed and updated through discharge.    

## 2012-04-15 NOTE — Progress Notes (Signed)
Physical Therapy Note  Patient Details  Name: Megan Espinoza MRN: [id number] Date of Birth: October 20, 1955 Today's Date: 04/15/2012  Pt declined of PT this afternoon, tearful and stating she just can't do it. Pt declined any mobility, seated or supine therex. Emotional support and encouragement provided.   Clydene Laming, PT, DPT  04/15/2012, 3:23 PM

## 2012-04-15 NOTE — Evaluation (Signed)
Occupational Therapy Assessment and Plan  Patient Details  Name: Megan Espinoza MRN: [id number] Date of Birth: [birth year]  OT Diagnosis: muscle weakness (generalized) Rehab Potential: Rehab Potential: Good ELOS: 2 1/2 weeks   Today's Date: 04/15/2012 Time: 1030-1130 Time Calculation (min): 60 min  Problem List:  Patient Active Problem List  Diagnosis  . Microcytic anemia  . Obesity (BMI 30.0-34.9)  . Acute respiratory failure with hypoxia  . ARDS (adult respiratory distress syndrome)  . Steroid-induced hyperglycemia  . Acute renal insufficiency  . Influenza A    Past Medical History:  Past Medical History  Diagnosis Date  . Obesity (BMI 30.0-34.9)   . Influenza, pneumonia December 2013    Associated with ARDS, Septic shock   Past Surgical History:  Past Surgical History  Procedure Date  . Cholecystectomy   . Hernia repair     Assessment & Plan Clinical Impression: Patient is a 56 y.o. year old right-handed female admitted 03/24/2012 with nonproductive cough and generalized myalgias with increasing shortness of breath and wheezing as well as fever of 102 with mixed chills but no nausea vomiting. Chest x-ray with bilateral airspace opacity suspicious for pneumonia left greater than right.  Placed on antibiotic therapy. Noted hemoglobin upon admission was 7.7 and ultimately transfused on 03/29/2012. CT of the chest confirmed patchy infiltrates with associated air bronchograms. Anemia workup consistent with iron deficiency with, low reticulocytes and no evidence of bleeding. On 03/28/2012 develop VDRF due to ARDS with followup per critical care medicine and maintained on steroid protocol. Patient remained intubated until 04/09/2012. Respiratory viral panel positive influenza and change to broad-spectrum antibiotics. Bouts of delirium agitation which has slowly improved suspect encephalopathy. Currently maintained on dysphagia 3 honey thick liquid per followup per speech  therapy. Subcutaneous heparin for DVT prophylaxis.  Patient transferred to CIR on 04/14/2012 .    Patient currently requires max with basic self-care skills secondary to muscle weakness, decreased cardiorespiratoy endurance and decreased oxygen support, decreased awareness and decreased standing balance.  Prior to hospitalization, patient could complete ADL with indepedently .  Patient will benefit from skilled intervention to decrease level of assist with basic self-care skills and increase independence with basic self-care skills prior to discharge home with care partner.  Anticipate patient will require intermittent supervision and follow up home health.  OT - End of Session Activity Tolerance: Tolerates 10 - 20 min activity with multiple rests Endurance Deficit: Yes Endurance Deficit Description: poor activity tolerance; limited standing before seated break needed. OT Assessment Rehab Potential: Good OT Plan OT Intensity: Minimum of 1-2 x/day, 45 to 90 minutes OT Frequency: 5 out of 7 days OT Duration/Estimated Length of Stay: 2 1/2 weeks OT Treatment/Interventions: Metallurgist training;Community reintegration;Discharge planning;Cognitive remediation/compensation;DME/adaptive equipment instruction;Functional mobility training;Patient/family education;Pain management;Self Care/advanced ADL retraining;Psychosocial support;Therapeutic Activities;UE/LE Strength taining/ROM;Therapeutic Exercise;UE/LE Coordination activities OT Recommendation Recommendations for Other Services: Neuropsych consult Patient destination: Home Follow Up Recommendations: Home health OT Equipment Recommended: 3 in 1 bedside comode;Tub/shower seat  OT Evaluation Precautions/Restrictions  Precautions Precautions: Fall Restrictions Weight Bearing Restrictions: No General Chart Reviewed: Yes Family/Caregiver Present: No Vital Signs Oxygen Therapy SpO2: 98 % O2 Device: Nasal cannula O2 Flow Rate  (L/min): 1 L/min Pain  no c/o pain in session Home Living/Prior Functioning Home Living Lives With: Significant other Available Help at Discharge: Family;Available PRN/intermittently Type of Home: Mobile home Home Access: Stairs to enter Entrance Stairs-Number of Steps: 3 Entrance Stairs-Rails: None Home Layout: One level Bathroom Shower/Tub: Walk-in shower;Tub/shower unit Insurance claims handler Accessibility:  No Home Adaptive Equipment: Hospital bed;Bedside commode/3-in-1;Walker - rolling ((items belonged to her son)) Prior Function Level of Independence: Independent with basic ADLs;Independent with homemaking with ambulation Able to Take Stairs?: Yes Driving: Yes Vocation: Part time employment 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 ADL  see FIM Vision/Perception  Vision - History Baseline Vision: Wears glasses only for reading Patient Visual Report: No change from baseline Vision - Assessment Eye Alignment: Within Functional Limits Perception Perception: Within Functional Limits Praxis Praxis: Intact  Cognition Overall Cognitive Status: Appears within functional limits for tasks assessed Arousal/Alertness: Awake/alert Orientation Level: Oriented X4 Attention: Selective Selective Attention: Appears intact Awareness: Impaired Awareness Impairment: Intellectual impairment Comments: pt anxious about mobility and emotional/tearful in regards to personal life (her son recently passed away). Discussed options for neuropsych and pt interested  Sensation Sensation Light Touch: Appears Intact Proprioception: Appears Intact Coordination Gross Motor Movements are Fluid and Coordinated: Yes Fine Motor Movements are Fluid and Coordinated: No Motor  Motor Motor: Within Functional Limits (significant deconditioning) Motor - Skilled Clinical Observations: significant deconditioning Mobility    see FIM - pt decline to attempt to stand or transfer with  OT Trunk/Postural Assessment  Cervical Assessment Cervical Assessment: Within Functional Limits Thoracic Assessment Thoracic Assessment: Within Functional Limits Lumbar Assessment Lumbar Assessment: Within Functional Limits  Balance Balance Balance Assessed: Yes Static Sitting Balance Static Sitting - Level of Assistance: 5: Stand by assistance Dynamic Sitting Balance Dynamic Sitting - Level of Assistance: 5: Stand by assistance Static Standing Balance Static Standing - Level of Assistance: 4: Min assist;1: +2 Total assist (second person due to pt anxiety) Dynamic Standing Balance Dynamic Standing - Level of Assistance: 3: Mod assist;1: +2 Total assist (second person due to pt anxiety) Extremity/Trunk Assessment RUE Assessment RUE Assessment: Within Functional Limits (grossly 4/5) LUE Assessment LUE Assessment:  (grossly 4/5)  See FIM for current functional status Refer to Care Plan for Long Term Goals  Recommendations for other services: Neuropsych  Discharge Criteria: Patient will be discharged from OT if patient refuses treatment 3 consecutive times without medical reason, if treatment goals not met, if there is a change in medical status, if patient makes no progress towards goals or if patient is discharged from hospital.  The above assessment, treatment plan, treatment alternatives and goals were discussed and mutually agreed upon: by patient  OT eval initiated with OT goals, purpose and role discussed with pt. Self care retraining at sink level. Pt demonstrated poor activity tolerance and required extra time for all tasks; requiring 1 liter of O2 to be on at all times. Pt HR in 120s throughout session with heavy breathing.  Pt was also tearful throughout session about her personal life requiring extra time. Pt would not attempt to stand despite multiple opportunities to try to perform clothing management.    Roney Mans [employer] [employer] 04/15/2012, 12:19 PM

## 2012-04-15 NOTE — Evaluation (Signed)
Speech Language Pathology Assessment and Plan  Patient Details  Name: NICOLE DEFINO MRN: [id number] Date of Birth: 04-06-1956  SLP Diagnosis: Cognitive Impairments;Dysphagia  Rehab Potential: Good ELOS: 10-14 days   Today's Date: 04/15/2012 Time: 1330-1430 Time Calculation (min): 60 min  Problem List:  Patient Active Problem List  Diagnosis  . Microcytic anemia  . Obesity (BMI 30.0-34.9)  . Acute respiratory failure with hypoxia  . ARDS (adult respiratory distress syndrome)  . Steroid-induced hyperglycemia  . Acute renal insufficiency  . Influenza A   Past Medical History:  Past Medical History  Diagnosis Date  . Obesity (BMI 30.0-34.9)   . Influenza, pneumonia December 2013    Associated with ARDS, Septic shock   Past Surgical History:  Past Surgical History  Procedure Date  . Cholecystectomy   . Hernia repair     Assessment / Plan / Recommendation Clinical Impression  Megan Espinoza is a 56 y.o. right-handed female admitted 03/24/2012 with nonproductive cough and generalized myalgias with increasing shortness of breath and wheezing as well as fever of 102 with mixed chills but no nausea vomiting. Chest x-ray with bilateral airspace opacity suspicious for pneumonia left greater than right. Placed on antibiotic therapy. On 03/28/2012 develop VDRF due to ARDS with follow up per critical care medicine and maintained on steroid protocol. Patient remained intubated until 04/09/2012. Respiratory viral panel positive influenza and change to broad-spectrum antibiotics. Bouts of delirium agitation which has slowly improved suspect encephalopathy. Currently maintained on dysphagia 3 honey thick liquid per follow up per speech therapy. Subcutaneous heparin for DVT prophylaxis. Patient was felt to be a good candidate for inpatient rehabilitation services and was admitted for comprehensive rehabilitation program 04/14/12. Upon evaluation 04/15/2012, patient presented with  ongoing respiratory difficulties remaining on nasal cannula. She demonstrated weak/hoarse vocal quality at baseline and weak cough/throat clear. She continues to demonstrate signs/symptoms of dysphagia particularly with thin liquids. Improved tolerance of nectar thick liquids from acute care stay.  Cognitive deficits impacting safety awareness and decision making. Patient reported increased fatigue during evaluation and was noted to have increased anxiety regarding falling. She was able to recall hospital course but required verbal cues to demonstrate comprehension of safety guidelines following prolonged intubation and hospitalization. Occasional word retrieval difficulties noted and patient was aware of errors. SLP suspects patient's abilities were within functional limits prior to illness. As a result, skilled SLP services are warranted during CIR stay to maximize functional independence and reduce burden of care upon discharge.    SLP Assessment  Patient will need skilled Speech Lanaguage Pathology Services during CIR admission    Recommendations  Diet Recommendations: Free water protocol after oral care;Dysphagia 3 (Mechanical Soft);Nectar-thick liquid Liquid Administration via: Cup;No straw Medication Administration: Whole meds with puree Supervision: Intermittent supervision to cue for compensatory strategies;Patient able to self feed Compensations: Slow rate;Small sips/bites;Multiple dry swallows after each bite/sip;Clear throat intermittently Postural Changes and/or Swallow Maneuvers: Out of bed for meals;Seated upright 90 degrees;Upright 30-60 min after meal Oral Care Recommendations: Oral care BID Recommendations for Other Services: Neuropsych consult Patient destination: Home Follow up Recommendations: 24 hour supervision/assistance;Home Health SLP Equipment Recommended: None recommended by SLP    SLP Frequency 5 out of 7 days   SLP Treatment/Interventions Dysphagia/aspiration  precaution training;Cognitive remediation/compensation;Cueing hierarchy;Environmental controls;Internal/external aids;Functional tasks;Patient/family education    Pain Pain Assessment Pain Assessment: No/denies pain Prior Functioning Cognitive/Linguistic Baseline: Information not available  Short Term Goals: Week 1: SLP Short Term Goal 1 (Week 1): Patient will consume recommended diet  without observed signs or symptoms of aspiration with supervision cues. SLP Short Term Goal 2 (Week 1): Patient will return demonstration of safe swallowing/compensatory strategies with supervision. SLP Short Term Goal 3 (Week 1): Patient will demonstrate recall of strategies for safe transfers with min cues.  See FIM for current functional status Refer to Care Plan for Long Term Goals  Recommendations for other services: Neuropsych  Discharge Criteria: Patient will be discharged from SLP if patient refuses treatment 3 consecutive times without medical reason, if treatment goals not met, if there is a change in medical status, if patient makes no progress towards goals or if patient is discharged from hospital.  The above assessment, treatment plan, treatment alternatives and goals were discussed and mutually agreed upon: by patient  Logged by Caralyn Guile, MS CCC-SLP  Jan Walters 04/15/2012, 4:32 PM

## 2012-04-15 NOTE — Evaluation (Signed)
Physical Therapy Assessment and Plan  Patient Details  Name: Megan Espinoza MRN: [id number] Date of Birth: [birth year]  PT Diagnosis: Difficulty walking, Decreased endurance and Muscle weakness Rehab Potential: Good ELOS: 2.5 weeks   Today's Date: 04/15/2012 Time: 7829-5621 Time Calculation (min): 60 min  Problem List:  Patient Active Problem List  Diagnosis  . Microcytic anemia  . Obesity (BMI 30.0-34.9)  . Acute respiratory failure with hypoxia  . ARDS (adult respiratory distress syndrome)  . Steroid-induced hyperglycemia  . Acute renal insufficiency  . Influenza A    Past Medical History:  Past Medical History  Diagnosis Date  . Obesity (BMI 30.0-34.9)   . Influenza, pneumonia December 2013    Associated with ARDS, Septic shock   Past Surgical History:  Past Surgical History  Procedure Date  . Cholecystectomy   . Hernia repair     Assessment & Plan Clinical Impression: Patient is a 56 y.o. year old female with recent admission to the hospital on 03/24/2012 with nonproductive cough and generalized myalgias with increasing shortness of breath and wheezing as well as fever of 102 with mixed chills but no nausea vomiting. Chest x-ray with bilateral airspace opacity suspicious for pneumonia left greater than right. Placed on antibiotic therapy. Noted hemoglobin upon admission was 7.7 and ultimately transfused on 03/29/2012. CT of the chest confirmed patchy infiltrates with associated air bronchograms. Anemia workup consistent with iron deficiency with, low reticulocytes and no evidence of bleeding. On 03/28/2012 develop VDRF due to ARDS with followup per critical care medicine and maintained on steroid protocol. Patient remained intubated until 04/09/2012. Respiratory viral panel positive influenza and change to broad-spectrum antibiotics. Bouts of delirium agitation which has slowly improved suspect encephalopathy. Currently maintained on dysphagia 3 honey thick liquid per  followup per speech therapy. Patient transferred to CIR on 04/14/2012 .   Patient currently requires mod A but 2 people due to pt anxiety initially with mobility secondary to muscle weakness, decreased cardiorespiratoy endurance and decreased oxygen support and decreased standing balance and decreased balance strategies.  Prior to hospitalization, patient was independent with mobility and lived with Significant other in a Mobile home home.  Home access is 3Stairs to enter.  Patient will benefit from skilled PT intervention to maximize safe functional mobility, minimize fall risk and decrease caregiver burden for planned discharge home with intermittent assist.  Anticipate patient will benefit from follow up [facility] at discharge.  PT - End of Session Endurance Deficit: Yes Endurance Deficit Description: poor activity tolerance; limited standing before seated break needed. Declined gait due to fatigue PT Assessment Rehab Potential: Good Barriers to Discharge: Inaccessible home environment (most likely will need railing for stairs) PT Plan PT Intensity: Minimum of 1-2 x/day ,45 to 90 minutes PT Frequency: 5 out of 7 days PT Duration Estimated Length of Stay: 2.5 weeks PT Treatment/Interventions: Ambulation/gait training;Balance/vestibular training;Community reintegration;Discharge planning;DME/adaptive equipment instruction;Functional mobility training;Neuromuscular re-education;Pain management;Patient/family education;Psychosocial support;Skin care/wound management;Splinting/orthotics;Stair training;Therapeutic Activities;Therapeutic Exercise;UE/LE Strength taining/ROM;UE/LE Coordination activities;Wheelchair propulsion/positioning PT Recommendation Recommendations for Other Services: Neuropsych consult (due to recent loss of son/anxiety) Follow Up Recommendations: Home health PT;24 hour supervision/assistance Patient destination: Home Equipment Recommended: Rolling walker with 5" wheels  PT  Evaluation Precautions/Restrictions Precautions Precautions: Fall Pain  Denies pain. Vital Signs 2L O2 via [place] with sats remaining between 90-95% during activity.  HR between 98-115 bpm during activity Home Living/Prior Functioning Home Living Lives With: Significant other Available Help at Discharge: Family;Available PRN/intermittently Type of Home: Mobile home Home Access: Stairs to enter Entrance  Stairs-Number of Steps: 3 Entrance Stairs-Rails: None Home Layout: One level Home Adaptive Equipment: Hospital bed;Bedside commode/3-in-1;Walker - rolling ((items belonged to her son)) Prior Function Level of Independence: Independent with basic ADLs;Independent with homemaking with ambulation Able to Take Stairs?: Yes Driving: Yes Vocation: Part time employment 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 Discussed options for neuropsych and pt interested Sensation Sensation Light Touch: Appears Intact Coordination Gross Motor Movements are Fluid and Coordinated: Yes Motor  Motor Motor: Within Functional Limits (significant deconditioning)  Locomotion  Ambulation Ambulation/Gait Assistance: 1: +2 Total assist;3: Mod assist  Trunk/Postural Assessment  Cervical Assessment Cervical Assessment: Within Functional Limits Thoracic Assessment Thoracic Assessment: Within Functional Limits Lumbar Assessment Lumbar Assessment: Within Functional Limits  Balance Balance Balance Assessed: Yes Static Sitting Balance Static Sitting - Level of Assistance: 5: Stand by assistance Dynamic Sitting Balance Dynamic Sitting -  Level of Assistance: 5: Stand by assistance Static Standing Balance Static Standing - Level of Assistance: 4: Min assist;1: +2 Total assist (second person due to pt anxiety) Dynamic Standing Balance Dynamic Standing - Level of Assistance: 3: Mod assist;1: +2 Total assist (second person due to pt anxiety) Extremity Assessment      RLE Assessment RLE Assessment: Exceptions to [facility] (strength grossly 3+ to 4-/5 (decreased muscular endurance)) LLE Assessment LLE Assessment: Exceptions to [facility] (strength grossly 3+ to 4-/5 (decreased muscular endurance))  FIM:  FIM - Banker Devices: Therapist, occupational: 4: Supine > Sit: Min A (steadying Pt. > 75%/lift 1 leg);1: Two helpers ((min A for bed to chair transfer but +2 due to pt anxiety)) FIM - Locomotion: Wheelchair Locomotion: Wheelchair: 1: Travels less than 50 ft with moderate assistance (Pt: 50 - 74%) FIM - Locomotion: Ambulation Locomotion: Ambulation Assistive Devices: Designer, industrial/product Ambulation/Gait Assistance: 1: +2 Total assist;3: Mod assist Locomotion: Ambulation: 1: Two helpers (due to pt anxiety)   Refer to Care Plan for Long Term Goals  Recommendations for other services: Neuropsych  Discharge Criteria: Patient will be discharged from PT if patient refuses treatment 3 consecutive times without medical reason, if treatment goals not met, if there is a change in medical status, if patient makes no progress towards goals or if patient is discharged from hospital.  The above assessment, treatment plan, treatment alternatives and goals were discussed and mutually agreed upon: by patient   Individual treatment initiated with focus on sit to stands (pt able to do with min A from EOB, but due to anxiety pt requested second person be present), limited w/c mobility, and functional tasks at the sink from w/c level. Pt declined further gait due to fatigue, but was able to take a few steps to the w/c  during eval with RW. Pt limited by fatigue and emotional distress, becoming tearful often during eval due to recent loss of son.   Karolee Stamps [facility] 04/15/2012, 9:53 AM

## 2012-04-15 NOTE — Progress Notes (Signed)
Subjective/Complaints: Didn't sleep much because of activity in and out of room. Denies cough, sob, pain A 12 point review of systems has been performed and if not noted above is otherwise negative.   Objective: Vital Signs: Blood pressure 142/83, pulse 87, temperature 98.1 F (36.7 C), temperature source Oral, resp. rate 18, SpO2 94.00%. No results found.  Basename 04/13/12 0500  WBC 8.0  HGB 8.7*  HCT 29.7*  PLT 291    Basename 04/13/12 0500  NA 150*  K 3.4*  CL 112  GLUCOSE 132*  BUN 42*  CREATININE 1.04  CALCIUM 9.4   CBG (last 3)  No results found for this basename: GLUCAP:3 in the last 72 hours  Wt Readings from Last 3 Encounters:  04/14/12 115.849 kg (255 lb 6.4 oz)    Physical Exam:  HENT:  Head: Normocephalic.  Eyes:  Pupils round and reactive to light  Neck: Normal range of motion. Neck supple. No thyromegaly present.  Cardiovascular: Normal rate and regular rhythm.  Pulmonary/Chest: Bilateral crackles Decreased breath sounds at the bases with limited inspiratory effort  Abdominal: Soft. Bowel sounds are normal. She exhibits no distension.  Obese  Neurological: She is alert.  Patient was able to name person, date of birth and age. She followed simple commands and name familiar objects. Voice intelligible. Reasonable insight and awareness. Skin: Skin is warm and dry.  Motor strength is 4/5 in bilateral deltoid, biceps, triceps, grip, 3   hip flexors,Knee extensors, 4 minus ankle dorsiflexors and plantar flexors  Sensation is intact to light touch in both upper and lower limbs. DTR's are 1+   Assessment/Plan: 1. Functional deficits secondary to severe deconditioning after pneumonia, respiratory failure which require 3+ hours per day of interdisciplinary therapy in a comprehensive inpatient rehab setting. Physiatrist is providing close team supervision and 24 hour management of active medical problems listed below. Physiatrist and rehab team continue to  assess barriers to discharge/monitor patient progress toward functional and medical goals. FIM:                   Comprehension Comprehension Mode: Auditory Comprehension: 5-Understands complex 90% of the time/Cues < 10% of the time  Expression Expression Mode: Verbal Expression: 5-Expresses complex 90% of the time/cues < 10% of the time  Social Interaction Social Interaction: 5-Interacts appropriately 90% of the time - Needs monitoring or encouragement for participation or interaction.  Problem Solving Problem Solving: 5-Solves complex 90% of the time/cues < 10% of the time  Memory Memory: 5-Recognizes or recalls 90% of the time/requires cueing < 10% of the time  Medical Problem List and Plan:  1. Deconditioning after respiratory failure with associated pneumonia  2. DVT Prophylaxis/Anticoagulation: Subcutaneous heparin. Monitor platelet counts any signs of bleeding  3. Mood/delirium. Xanax 0.25 mg every 6 hours as needed. Provide emotional support and positive reinforcement  4. Neuropsych: This patient Is capable of making decisions on his/her own behalf.  5. Dysphagia. Currently on dysphagia 3 honey thick liquids. Monitor for any signs of aspiration as well as hydration. Followup chemistries and may need some nighttime fluids to maintain hydration  6. Obesity. Dietary followup  7. Anemia. Anemia workup consistent with iron deficiency anemia with no evidence of bleeding. Followup CBC and continue iron supplement   LOS (Days) 1 A FACE TO FACE EVALUATION WAS PERFORMED  Megan Espinoza T 04/15/2012 7:50 AM

## 2012-04-15 NOTE — Progress Notes (Signed)
Occupational Therapy Session Note  Patient Details  Name: Megan Espinoza MRN: [id number] Date of Birth: 1955/09/07  Today's Date: 04/15/2012 Time: 1030-1130 Time Calculation (min): 60 min  Short Term Goals: Week 1:  OT Short Term Goal 1 (Week 1): Pt will transfer to toilet / BSC with min A  OT Short Term Goal 2 (Week 1): Pt will do LB dressing with mod A with extra time OT Short Term Goal 3 (Week 1): Pt will perform sit to stand for clothing management with min a OT Short Term Goal 4 (Week 1): Pt will perform toileting wtih mod A  Skilled Therapeutic Interventions/Progress Updates:  Balance/vestibular training;Community reintegration;Discharge planning;Cognitive remediation/compensation;DME/adaptive equipment instruction;Functional mobility training;Patient/family education;Pain management;Self Care/advanced ADL retraining;Psychosocial support;Therapeutic Activities;UE/LE Strength taining/ROM;Therapeutic Exercise;UE/LE Coordination activities   Pt declined to participate in any activity this pm requiring her to move. Missed 30 min of OT  Therapy Documentation Precautions:  Precautions Precautions: Fall Restrictions Weight Bearing Restrictions: No General: General Chart Reviewed: Yes Amount of Missed OT Time (min): 30 Minutes Family/Caregiver Present: No Vital Signs: Oxygen Therapy SpO2: 98 % O2 Device: Nasal cannula O2 Flow Rate (L/min): 1 L/min Pain:  pt requested meds for anxiety and pain RN made aware - helped reposition her legs See FIM for current functional status  Therapy/Group: Individual Therapy  Roney Mans [facility] 04/15/2012, 1:22 PM

## 2012-04-16 ENCOUNTER — Encounter (HOSPITAL_COMMUNITY): Payer: Self-pay | Admitting: Occupational Therapy

## 2012-04-16 ENCOUNTER — Inpatient Hospital Stay (HOSPITAL_COMMUNITY): Payer: MEDICAID | Admitting: Physical Therapy

## 2012-04-16 ENCOUNTER — Inpatient Hospital Stay (HOSPITAL_COMMUNITY): Payer: Self-pay | Admitting: Speech Pathology

## 2012-04-16 ENCOUNTER — Inpatient Hospital Stay (HOSPITAL_COMMUNITY): Payer: MEDICAID | Admitting: *Deleted

## 2012-04-16 MED ORDER — TRAZODONE HCL 50 MG PO TABS
50.0000 mg | ORAL_TABLET | Freq: Every evening | ORAL | Status: DC | PRN
  Administered 2012-04-16 – 2012-04-22 (×7): 50 mg via ORAL
  Filled 2012-04-16 (×7): qty 1

## 2012-04-16 MED ORDER — CAMPHOR-MENTHOL 0.5-0.5 % EX LOTN
TOPICAL_LOTION | CUTANEOUS | Status: DC | PRN
  Administered 2012-04-16: 13:00:00 via TOPICAL
  Filled 2012-04-16: qty 222

## 2012-04-16 MED ORDER — DIPHENHYDRAMINE HCL 25 MG PO CAPS: 25.0000 mg | ORAL_CAPSULE | Freq: Four times a day (QID) | ORAL | Status: DC | PRN

## 2012-04-16 NOTE — Progress Notes (Signed)
Physical Therapy Session Note  Patient Details  Name: Megan Espinoza MRN: [id number] Date of Birth: 02-06-1956  Today's Date: 04/16/2012 Time: 2130-8657 Time Calculation (min): 31 min  Short Term Goals: Week 1:  PT Short Term Goal 1 (Week 1): Pt will be able to transfer consistently with min A PT Short Term Goal 2 (Week 1): Pt will be able to gait x 25' with min A PT Short Term Goal 3 (Week 1): Pt will be able to demonstrate dynamic standing balance with min A for functional tasks PT Short Term Goal 4 (Week 1): Pt will be able to go up/down 3 steps  with mod A  Skilled Therapeutic Interventions/Progress Updates:   Pt reporting that she did not sleep well last night and that she just did not feel good.  Stated she was trying but she just couldn't do it.  Pt performed supine to sit with mod@.  Sat on EOB with supervision while providing assistance to donn pants and shoes.  Multiple sit to stands with mod@, with RW.  Used RW to perform stand pivot to w/c with mod@.  Assisted pt at sink with grooming activities until pt felt she could no longer participate.   Therapy Documentation Precautions:  Precautions Precautions: Fall Restrictions Weight Bearing Restrictions: No General: Amount of Missed PT Time (min): 14 Minutes Missed Time Reason: Other (comment) (Pt insisting that she just couldn't do anymore.) Vital Signs: O2 at 90-91%, HR 114-116, during session. Pain:  No pain See FIM for current functional status  Therapy/Group: Individual Therapy  Georges Mouse 04/16/2012, 10:24 AM

## 2012-04-16 NOTE — Progress Notes (Signed)
Subjective/Complaints:  insomnia, itching, A 12 point review of systems has been performed and if not noted above is otherwise negative.   Objective: Vital Signs: Blood pressure 150/85, pulse 97, temperature 98.9 F (37.2 C), temperature source Oral, resp. rate 18, SpO2 94.00%. No results found.  Basename 04/15/12 0954  WBC 10.8*  HGB 9.7*  HCT 31.9*  PLT 255    Basename 04/15/12 0954  NA 142  K 3.5  CL 104  GLUCOSE 114*  BUN 29*  CREATININE 0.99  CALCIUM 9.3   CBG (last 3)  No results found for this basename: GLUCAP:3 in the last 72 hours  Wt Readings from Last 3 Encounters:  04/14/12 115.849 kg (255 lb 6.4 oz)    Physical Exam:  HENT:  Head: Normocephalic.  Eyes:  Pupils round and reactive to light  Neck: Normal range of motion. Neck supple. No thyromegaly present.  Cardiovascular: Normal rate and regular rhythm.  Pulmonary/Chest: Bilateral crackles Decreased breath sounds at the bases with limited inspiratory effort  Abdominal: Soft. Bowel sounds are normal. She exhibits no distension.  Obese  Neurological: She is alert.  Patient was able to name person, date of birth and age. She followed simple commands and name familiar objects. Voice intelligible. Reasonable insight and awareness. Skin: Skin is warm and dry. No visible rash. Skin a little dry Motor strength is 4/5 in bilateral deltoid, biceps, triceps, grip, 3   hip flexors,Knee extensors, 4 minus ankle dorsiflexors and plantar flexors  Sensation is intact to light touch in both upper and lower limbs. DTR's are 1+   Assessment/Plan: 1. Functional deficits secondary to severe deconditioning after pneumonia, respiratory failure which require 3+ hours per day of interdisciplinary therapy in a comprehensive inpatient rehab setting. Physiatrist is providing close team supervision and 24 hour management of active medical problems listed below. Physiatrist and rehab team continue to assess barriers to  discharge/monitor patient progress toward functional and medical goals. FIM: FIM - Bathing Bathing Steps Patient Completed: Chest;Right Arm;Left Arm;Abdomen Bathing: 2: Max-Patient completes 3-4 94f 10 parts or 25-49%  FIM - Upper Body Dressing/Undressing Upper body dressing/undressing steps patient completed: Thread/unthread right sleeve of pullover shirt/dresss;Thread/unthread left sleeve of pullover shirt/dress;Put head through opening of pull over shirt/dress Upper body dressing/undressing: 4: Min-Patient completed 75 plus % of tasks FIM - Lower Body Dressing/Undressing Lower body dressing/undressing: 1: Total-Patient completed less than 25% of tasks (refused to stand up to pull pants)        FIM - Bed/Chair Transfer Bed/Chair Transfer Assistive Devices: Therapist, occupational: 4: Supine > Sit: Min A (steadying Pt. > 75%/lift 1 leg);1: Two helpers ((min A for bed to chair transfer but +2 due to pt anxiety))  FIM - Locomotion: Wheelchair Locomotion: Wheelchair: 1: Travels less than 50 ft with moderate assistance (Pt: 50 - 74%) FIM - Locomotion: Ambulation Locomotion: Ambulation Assistive Devices: Designer, industrial/product Ambulation/Gait Assistance: 1: +2 Total assist;3: Mod assist Locomotion: Ambulation: 1: Two helpers (due to pt anxiety)  Comprehension Comprehension Mode: Auditory Comprehension: 5-Understands basic 90% of the time/requires cueing < 10% of the time  Expression Expression Mode: Verbal Expression: 5-Expresses basic needs/ideas: With extra time/assistive device  Social Interaction Social Interaction: 5-Interacts appropriately 90% of the time - Needs monitoring or encouragement for participation or interaction.  Problem Solving Problem Solving: 4-Solves basic 75 - 89% of the time/requires cueing 10 - 24% of the time  Memory Memory: 4-Recognizes or recalls 75 - 89% of the time/requires cueing 10 - 24% of the  time  Medical Problem List and Plan:  1. Deconditioning  after respiratory failure with associated pneumonia  2. DVT Prophylaxis/Anticoagulation: Subcutaneous heparin. Monitor platelet counts any signs of bleeding  3. Mood/delirium. Xanax 0.25 mg every 6 hours as needed. Provide emotional support and positive reinforcement  4. Neuropsych: This patient Is capable of making decisions on his/her own behalf.  5. Dysphagia. Currently on dysphagia 3 nectar thick liquids. Maintaining hydration. No intolerance issues yet 6. Obesity. Dietary followup  7. Anemia. Anemia workup consistent with iron deficiency anemia with no evidence of bleeding. Followup CBC  With improved hgb  - continue iron supplement  8. Pruritus: benadyrl, sarna 9. Insomnia: prn trazodone  LOS (Days) 2 A FACE TO FACE EVALUATION WAS PERFORMED  SWARTZ,ZACHARY T 04/16/2012 6:49 AM

## 2012-04-16 NOTE — Progress Notes (Signed)
Speech Language Pathology Daily Session Note  Patient Details  Name: Megan Espinoza MRN: [id number] Date of Birth: [birth year]  Today's Date: 04/16/2012 Time: 1100-1130 Time Calculation (min): 30 min  Short Term Goals: Week 1: SLP Short Term Goal 1 (Week 1): Patient will consume recommended diet without observed signs or symptoms of aspiration with supervision cues. SLP Short Term Goal 1 - Progress (Week 1): Progressing toward goal SLP Short Term Goal 2 (Week 1): Patient will return demonstration of safe swallowing/compensatory strategies with supervision. SLP Short Term Goal 2 - Progress (Week 1): Progressing toward goal SLP Short Term Goal 3 (Week 1): Patient will demonstrate recall of strategies for safe transfers with min cues.  Skilled Therapeutic Interventions: Pt. seen today following initial assessment performed yesterday.  Treatment focused on dysphagia with SLP reviewing chart, discussions with pt./family and clinical observation.  Pt. was intubated for approximatly 21 days and was able to initiate honey thick liquids and Dys 3 diet on 12/23.  Upon arrival to CIR, pt. was upgraded to nectar thick liquids and the water protocol was initiated.  Pt./spouse reported that MD allowed pt. to eat/drink "what I was able" on acute care prior to SLP swallow assessment in which she reported frequent coughing with the thin liquids.  Today, this SLP observed pt. with a total of approximatly 5 oz of thin water and coke (following oral care) with 1 delayed throat clear.  Pt. stated did not cough today compared to last weekend.  Pt. appears safe to upgrade liquids to thin and diet texture to regular.  SLP recommends no straws and pills whole with appleauce.  ST will continue to ensure safety with diet/liquids and implementation to safe swallow precautions.    FIM:  Comprehension Comprehension: 5-Follows basic conversation/direction: With extra time/assistive device Expression Expression Mode:  Verbal Expression: 5-Expresses basic needs/ideas: With extra time/assistive device Social Interaction Social Interaction: 5-Interacts appropriately 90% of the time - Needs monitoring or encouragement for participation or interaction. Problem Solving Problem Solving: 5-Solves basic 90% of the time/requires cueing < 10% of the time Memory Memory: 4-Recognizes or recalls 75 - 89% of the time/requires cueing 10 - 24% of the time FIM - Eating Eating Activity: 5: Supervision/cues  Pain Pain Assessment Pain Assessment: No/denies pain  Therapy/Group: Individual Therapy  Royce Macadamia 04/16/2012, 4:23 PM

## 2012-04-16 NOTE — Progress Notes (Signed)
Occupational Therapy Session Note  Patient Details  Name: Megan Espinoza MRN: [id number] Date of Birth: Sep 17, 1955  Today's Date: 04/16/2012 Time: 1000-1055 Time Calculation (min): 55 min  Short Term Goals: Week 1:  OT Short Term Goal 1 (Week 1): Pt will transfer to toilet / BSC with min A  OT Short Term Goal 2 (Week 1): Pt will do LB dressing with mod A with extra time OT Short Term Goal 3 (Week 1): Pt will perform sit to stand for clothing management with min a OT Short Term Goal 4 (Week 1): Pt will perform toileting wtih mod A  Skilled Therapeutic Interventions/Progress Updates:    1:1 Pt reported she already donned clean pants on and shoes and socks, just wanted to don a clean shirt. Pt agreed with max encouragement to participate in activity in gym. Husband present for session. Focus on basic transfers with RW, sit to stands, bed mobility, activity tolerance/ endurance training, standing tolerance, generalized UB strengthening with green theraband. Pt required max encouragement with lots of rest breaks throughout the session. Pt's O2 remained >91 % on 1 liter of O2 but HR remained high 110-130s. Took rest breaks with HR >120. Pt with significant fatigue and often report she just cant do anything. When pt would perform basic mobility pt able to perform with basic min A.  Therapy Documentation Precautions:  Precautions Precautions: Fall Restrictions Weight Bearing Restrictions: No Pain:  no pain just itching and extreme fatigue   See FIM for current functional status  Therapy/Group: Individual Therapy  Roney Mans [facility] 04/16/2012, 12:21 PM

## 2012-04-16 NOTE — Progress Notes (Signed)
Occupational Therapy Note  Patient Details  Name: Megan Espinoza MRN: [id number] Date of Birth: 05/17/1955 Today's Date: 04/16/2012 Time:  1515-1545  (30 min) Individual therapy Pain:  Pt. C/o itching on back, legs, arms.   Pt. Sitting in wc upon OT arrival.  She commented more on the itching.   She thinks she is allergic to the sheets.  She has asked for the hypoallergenic sheets but has not received any yet.  Pt. Reported she has not been sleeping.  Performed theraband exercises with shoulder flexion, shoulder abduction, shoulder external rotation.  Asked pt to practice theraband exercises 2 times a day, 10 reps.  Pt is to sit up straight when she is doing the exercises  Pt. Reports she will do again this evening.  Left pt in wc with call bell in place.      Humberto Seals 04/16/2012, 3:23 PM

## 2012-04-17 ENCOUNTER — Inpatient Hospital Stay (HOSPITAL_COMMUNITY): Payer: MEDICAID

## 2012-04-17 ENCOUNTER — Inpatient Hospital Stay (HOSPITAL_COMMUNITY): Payer: Self-pay

## 2012-04-17 ENCOUNTER — Inpatient Hospital Stay (HOSPITAL_COMMUNITY): Payer: MEDICAID | Admitting: Occupational Therapy

## 2012-04-17 ENCOUNTER — Inpatient Hospital Stay (HOSPITAL_COMMUNITY): Payer: Self-pay | Admitting: Occupational Therapy

## 2012-04-17 DIAGNOSIS — R5381 Other malaise: Secondary | ICD-10-CM

## 2012-04-17 LAB — URINALYSIS, ROUTINE W REFLEX MICROSCOPIC
Bilirubin Urine: NEGATIVE
Ketones, ur: NEGATIVE mg/dL
Nitrite: NEGATIVE
Urobilinogen, UA: 0.2 mg/dL (ref 0.0–1.0)
pH: 5.5 (ref 5.0–8.0)

## 2012-04-17 LAB — URINE MICROSCOPIC-ADD ON

## 2012-04-17 NOTE — Progress Notes (Signed)
Subjective/Complaints: Malodorous urine A 12 point review of systems has been performed and if not noted above is otherwise negative.   Objective: Vital Signs: Blood pressure 153/84, pulse 83, temperature 98.1 F (36.7 C), temperature source Oral, resp. rate 19, SpO2 92.00%. No results found.  Basename 04/15/12 0954  WBC 10.8*  HGB 9.7*  HCT 31.9*  PLT 255    Basename 04/15/12 0954  NA 142  K 3.5  CL 104  GLUCOSE 114*  BUN 29*  CREATININE 0.99  CALCIUM 9.3   CBG (last 3)  No results found for this basename: GLUCAP:3 in the last 72 hours  Wt Readings from Last 3 Encounters:  04/14/12 115.849 kg (255 lb 6.4 oz)    Physical Exam:  HENT:  Head: Normocephalic.  Eyes:  Pupils round and reactive to light  Neck: Normal range of motion. Neck supple. No thyromegaly present.  Cardiovascular: Normal rate and regular rhythm.  Pulmonary/Chest: Bilateral crackles Decreased breath sounds at the bases with limited inspiratory effort  Abdominal: Soft. Bowel sounds are normal. She exhibits no distension.  Obese  Neurological: She is alert.  Patient was able to name person, date of birth and age. She followed simple commands and name familiar objects. Voice intelligible. Reasonable insight and awareness. Skin: Skin is warm and dry. No visible rash. Skin a little dry Motor strength is 4/5 in bilateral deltoid, biceps, triceps, grip, 3   hip flexors,Knee extensors, 4 minus ankle dorsiflexors and plantar flexors  Sensation is intact to light touch in both upper and lower limbs. DTR's are 1+   Assessment/Plan: 1. Functional deficits secondary to severe deconditioning after pneumonia, respiratory failure which require 3+ hours per day of interdisciplinary therapy in a comprehensive inpatient rehab setting. Physiatrist is providing close team supervision and 24 hour management of active medical problems listed below. Physiatrist and rehab team continue to assess barriers to  discharge/monitor patient progress toward functional and medical goals. FIM: FIM - Bathing Bathing Steps Patient Completed: Chest;Right Arm;Left Arm;Abdomen Bathing: 2: Max-Patient completes 3-4 21f 10 parts or 25-49%  FIM - Upper Body Dressing/Undressing Upper body dressing/undressing steps patient completed: Thread/unthread right sleeve of pullover shirt/dresss;Thread/unthread left sleeve of pullover shirt/dress;Put head through opening of pull over shirt/dress Upper body dressing/undressing: 4: Min-Patient completed 75 plus % of tasks FIM - Lower Body Dressing/Undressing Lower body dressing/undressing: 1: Total-Patient completed less than 25% of tasks (refused to stand up to pull pants)        FIM - Bed/Chair Transfer Bed/Chair Transfer Assistive Devices: Manufacturing systems engineer Transfer: 3: Supine > Sit: Mod A (lifting assist/Pt. 50-74%/lift 2 legs;3: Bed > Chair or W/C: Mod A (lift or lower assist)  FIM - Locomotion: Wheelchair Locomotion: Wheelchair: 0: Activity did not occur FIM - Locomotion: Ambulation Locomotion: Ambulation Assistive Devices: Designer, industrial/product Ambulation/Gait Assistance: 1: +2 Total assist;3: Mod assist Locomotion: Ambulation: 0: Activity did not occur  Comprehension Comprehension Mode: Auditory Comprehension: 5-Follows basic conversation/direction: With extra time/assistive device  Expression Expression Mode: Verbal Expression: 5-Expresses basic needs/ideas: With extra time/assistive device  Social Interaction Social Interaction: 5-Interacts appropriately 90% of the time - Needs monitoring or encouragement for participation or interaction.  Problem Solving Problem Solving: 5-Solves basic 90% of the time/requires cueing < 10% of the time  Memory Memory: 4-Recognizes or recalls 75 - 89% of the time/requires cueing 10 - 24% of the time  Medical Problem List and Plan:  1. Deconditioning after respiratory failure with associated pneumonia  2. DVT  Prophylaxis/Anticoagulation: Subcutaneous heparin. Monitor  platelet counts any signs of bleeding  3. Mood/delirium. Xanax 0.25 mg every 6 hours as needed. Provide emotional support and positive reinforcement  4. Neuropsych: This patient Is capable of making decisions on his/her own behalf.  5. Dysphagia. Currently on dysphagia 3 nectar thick liquids. Maintaining hydration. No intolerance issues yet 6. Obesity. Dietary followup  7. Anemia. Anemia workup consistent with iron deficiency anemia with no evidence of bleeding. Followup CBC  With improved hgb  - continue iron supplement  8. Pruritus: benadyrl, sarna 9. Insomnia: prn trazodone 10. Urine: check ua and culture  LOS (Days) 3 A FACE TO FACE EVALUATION WAS PERFORMED  SWARTZ,ZACHARY T 04/17/2012 8:03 AM

## 2012-04-17 NOTE — Progress Notes (Signed)
Occupational Therapy Session Note  Patient Details  Name: Megan Espinoza MRN: [id number] Date of Birth: 03/29/1956  Today's Date: 04/17/2012   Patient c/o fatigue and "I don't feel well" and missed 45 minute OT PM session today. Therapy Documentation Precautions:  Precautions Precautions: Fall Restrictions Weight Bearing Restrictions: No   General Missed Time Reason: Patient fatigue Pain Assessment Pain Assessment: No/denies pain  See FIM for current functional status  Therapy/Group: Individual Therapy  Bud Face [facility] 04/17/2012, 3:55 PM

## 2012-04-17 NOTE — Progress Notes (Signed)
Occupational Therapy Session Note  Patient Details  Name: Megan Espinoza MRN: [id number] Date of Birth: October 27, 1955  Today's Date: 04/17/2012 Time: 0700-0800 Time Calculation (min): 60 min  Skilled Therapeutic Interventions/Progress Updates:   AM session:  ADL in w/c at sink with focus on pacing herself, energy conservation, and sit to stand in spite of c/o extreme fatigue.    Pt required extra time and many rest breaks as she fatigued with all movements.    Patient stated, "They say do this, do that, but I can only do so much before I flop and give out.  I am so tired.  No one understands."   Also patient stated, "My heart rate ran in the 90s to 115 even when I was slim."  Additionally she stated, "I just lost my son about 2 weeks ago."  PM session:  Patient  c/o fatigue and "just sick today" and missed the 45 minute session Therapy Documentation Precautions:  Precautions Precautions: Fall Restrictions Weight Bearing Restrictions: No Pain: denied    See FIM for current functional status  Therapy/Group: Individual Therapy  Bud Face [facility] 04/17/2012, 2:14 PM

## 2012-04-17 NOTE — Progress Notes (Signed)
Physical Therapy Note  Patient Details  Name: Megan Espinoza MRN: [id number] Date of Birth: [birth year] Today's Date: 04/17/2012  3:15 - 3:35 20 minutes Individual session Patient denies pain.  Attempted to see patient at 1:45. Patient reported that she did not feel up to working with therapy and asked me to come back later. Returned at 3:15. Patient still reporting this is a bad day and feeling "shaky" inside. Patient did agree to get out of bed and sit in wheelchair. Patient supine to sit with supervision using head of bed raised and bed rails. Patient sit to stand and ambulated about 4 feet to wheelchair with minimal assist. Attempted to get patient to stand or ambulate again. Patient refused reporting she was too fatigued. Patient missed 25 minutes of scheduled PT in pm.   Arelia Longest M 04/17/2012, 3:42 PM

## 2012-04-17 NOTE — Progress Notes (Signed)
Physical Therapy Note  Patient Details  Name: Megan Espinoza MRN: [id number] Date of Birth: 1955/10/09 Today's Date: 04/17/2012  S: Patient reports being very tired from bathing and dressing session. "I need my breathing treatment and am very hungry" "I don't mean to be a bitch but I really want to eat." "It doesn't matter what I say, you all do what you want anyway." I don't know why I have so much therapy back to back." Patient missed 45 minutes of physical therapy due to fatigue and wanting to eat breakfast. O2 sat at 89% immediately went above 90 with a couple of deep breaths.     Arelia Longest M 04/17/2012, 8:29 AM

## 2012-04-18 ENCOUNTER — Encounter (HOSPITAL_COMMUNITY): Payer: Self-pay | Admitting: Occupational Therapy

## 2012-04-18 ENCOUNTER — Inpatient Hospital Stay (HOSPITAL_COMMUNITY): Payer: MEDICAID | Admitting: Speech Pathology

## 2012-04-18 ENCOUNTER — Inpatient Hospital Stay (HOSPITAL_COMMUNITY): Payer: Self-pay | Admitting: Occupational Therapy

## 2012-04-18 ENCOUNTER — Inpatient Hospital Stay (HOSPITAL_COMMUNITY): Payer: MEDICAID | Admitting: Physical Therapy

## 2012-04-18 ENCOUNTER — Encounter (HOSPITAL_COMMUNITY): Payer: Self-pay

## 2012-04-18 DIAGNOSIS — R5381 Other malaise: Secondary | ICD-10-CM

## 2012-04-18 LAB — URINE CULTURE: Culture: NO GROWTH

## 2012-04-18 NOTE — Progress Notes (Signed)
SLP Cancellation Note  Patient Details Name: Megan Espinoza MRN: [id number] DOB: [birth year]   Cancelled treatment:        Patient missed 30 minutes of skilled, group therapy due to refusal.  Patient stated that she was waiting on notary and did not want to go to lunch today.     Fae Pippin, M.A., CCC-SLP [phone]  Cheetara Hoge 04/18/2012, 12:34 PM

## 2012-04-18 NOTE — Progress Notes (Signed)
Speech Language Pathology Daily Session Note  Patient Details  Name: Megan Espinoza MRN: [id number] Date of Birth: 1955-06-10  Today's Date: 04/18/2012 Time: 0930-1005 Time Calculation (min): 35 min  Short Term Goals: Week 1: SLP Short Term Goal 1 (Week 1): Patient will consume recommended diet without observed signs or symptoms of aspiration with supervision cues. SLP Short Term Goal 1 - Progress (Week 1): Progressing toward goal SLP Short Term Goal 2 (Week 1): Patient will return demonstration of safe swallowing/compensatory strategies with supervision. SLP Short Term Goal 2 - Progress (Week 1): Progressing toward goal SLP Short Term Goal 3 (Week 1): Patient will demonstrate recall of strategies for safe transfers with min cues.  Skilled Therapeutic Interventions: Skilled treatment session focused on addressing dysphonia and dysphagia.  SLP facilitated session with a skilled observation of patient consuming thin liquids via cup with intermittent delayed cough, which occured at baseline.  SLP also facilitated session by educating patient regarding vocal hygiene program.  Patient verbalized understanding of information and left with handout.     FIM:  Comprehension Comprehension Mode: Auditory Comprehension: 7-Follows complex conversation/direction: With no assist Expression Expression Mode: Verbal Expression: 6-Expresses complex ideas: With extra time/assistive device Social Interaction Social Interaction: 6-Interacts appropriately with others with medication or extra time (anti-anxiety, antidepressant). Problem Solving Problem Solving: 5-Solves complex 90% of the time/cues < 10% of the time Memory Memory: 5-Requires cues to use assistive device FIM - Eating Eating Activity: 6: More than reasonable amount of time  Pain Pain Assessment Pain Assessment: No/denies pain  Therapy/Group: Individual Therapy  [name], CCC-SLP [phone]  [name] 04/18/2012,  10:13 AM

## 2012-04-18 NOTE — Progress Notes (Signed)
Occupational Therapy Session Note  Patient Details  Name: Megan Espinoza MRN: [id number] Date of Birth: 25-Nov-1955  Today's Date: 04/18/2012 Time: 1030-1115 Time Calculation (min): 45 min  Short Term Goals: Week 1:  OT Short Term Goal 1 (Week 1): Pt will transfer to toilet / BSC with min A  OT Short Term Goal 2 (Week 1): Pt will do LB dressing with mod A with extra time OT Short Term Goal 3 (Week 1): Pt will perform sit to stand for clothing management with min a OT Short Term Goal 4 (Week 1): Pt will perform toileting wtih mod A  Skilled Therapeutic Interventions/Progress Updates:    1:1 focus on self care retraining at sink level. Had a long discussion with pt about expectation of therapy, therapy goals and what to work towards, her goals for home, how important participation in therapy is so she can d/c home. Focus on sit to stands with recall of proper hand placement for success, standing balance, endurance training without O2. Pt's O2 maintaining >88 on RA with minimal activity, however HR increased to 120-130s. Pt demonstrated functional ambulation from sink to door with supervision but reported she couldn't continue.  1:1 2nd session 14:00-14:30 focus on functional transfers: car transfer into simulated car height to her son's car, functional ambulation short distance with rest breaks, pt able to lift legs into bed with min A. Pt's friend present for session and provided emotional support.   Therapy Documentation Precautions:  Precautions Precautions: Fall Restrictions Weight Bearing Restrictions: No General: General Amount of Missed OT Time (min): 15 Minutes Vital Signs: Oxygen Therapy SpO2: 94 % O2 Device: None (Room air) Pain: No c/o pain See FIM for current functional status  Therapy/Group: Individual Therapy  Roney Mans [facility] 04/18/2012, 3:21 PM

## 2012-04-18 NOTE — Progress Notes (Signed)
Subjective/Complaints: Slept well. Had a tough day yesterday because of family issues. A 12 point review of systems has been performed and if not noted above is otherwise negative.   Objective: Vital Signs: Blood pressure 142/82, pulse 88, temperature 97.8 F (36.6 C), temperature source Oral, resp. rate 20, SpO2 91.00%. No results found.  Basename 04/15/12 0954  WBC 10.8*  HGB 9.7*  HCT 31.9*  PLT 255    Basename 04/15/12 0954  NA 142  K 3.5  CL 104  GLUCOSE 114*  BUN 29*  CREATININE 0.99  CALCIUM 9.3   CBG (last 3)  No results found for this basename: GLUCAP:3 in the last 72 hours  Wt Readings from Last 3 Encounters:  04/14/12 115.849 kg (255 lb 6.4 oz)    Physical Exam:  HENT:  Head: Normocephalic.  Eyes:  Pupils round and reactive to light  Neck: Normal range of motion. Neck supple. No thyromegaly present.  Cardiovascular: Normal rate and regular rhythm.  Pulmonary/Chest: Bilateral crackles Decreased breath sounds at the bases with limited inspiratory effort  Abdominal: Soft. Bowel sounds are normal. She exhibits no distension.  Obese  Neurological: She is alert.  Generally appropriate. Reasonable insight and awareness. Skin: Skin is warm and dry. No visible rash. Skin a little dry Motor strength is 4/5 in bilateral deltoid, biceps, triceps, grip, 3-4   hip flexors,Knee extensors, 4 minus ankle dorsiflexors and plantar flexors  Sensation is intact to light touch in both upper and lower limbs. DTR's are 1+   Assessment/Plan: 1. Functional deficits secondary to severe deconditioning after pneumonia, respiratory failure which require 3+ hours per day of interdisciplinary therapy in a comprehensive inpatient rehab setting. Physiatrist is providing close team supervision and 24 hour management of active medical problems listed below. Physiatrist and rehab team continue to assess barriers to discharge/monitor patient progress toward functional and medical  goals.  Pt has multiple family issues in play. Says that she "needs to be home by the weekend"  FIM: FIM - Bathing Bathing Steps Patient Completed: Chest;Right Arm;Left Arm;Abdomen;Right upper leg;Left upper leg (in w/c at sink) Bathing: 3: Mod-Patient completes 5-7 14f 10 parts or 50-74% (required several rest breaks and extra time)  FIM - Upper Body Dressing/Undressing Upper body dressing/undressing steps patient completed: Thread/unthread left sleeve of pullover shirt/dress;Put head through opening of pull over shirt/dress;Pull shirt over trunk;Thread/unthread right sleeve of pullover shirt/dresss Upper body dressing/undressing: 5: Supervision: Safety issues/verbal cues FIM - Lower Body Dressing/Undressing Lower body dressing/undressing steps patient completed: Thread/unthread right underwear leg;Thread/unthread right pants leg Lower body dressing/undressing: 1: Total-Patient completed less than 25% of tasks  FIM - Toileting Toileting: 0: Activity did not occur  FIM - Diplomatic Services operational officer Devices: Grab bars Toilet Transfers: 0-Activity did not occur  FIM - Banker Devices: HOB elevated;Bed rails Bed/Chair Transfer: 5: Supine > Sit: Supervision (verbal cues/safety issues);4: Bed > Chair or W/C: Min A (steadying Pt. > 75%)  FIM - Locomotion: Wheelchair Locomotion: Wheelchair: 0: Activity did not occur FIM - Locomotion: Ambulation Locomotion: Ambulation Assistive Devices: Designer, industrial/product Ambulation/Gait Assistance: 1: +2 Total assist;3: Mod assist Locomotion: Ambulation: 0: Activity did not occur  Comprehension Comprehension Mode: Auditory Comprehension: 7-Follows complex conversation/direction: With no assist  Expression Expression Mode: Verbal Expression: 7-Expresses complex ideas: With no assist  Social Interaction Social Interaction: 6-Interacts appropriately with others with medication or extra time  (anti-anxiety, antidepressant).  Problem Solving Problem Solving: 5-Solves complex 90% of the time/cues < 10% of  the time  Memory Memory: 7-Complete Independence: No helper  Medical Problem List and Plan:  1. Deconditioning after respiratory failure with associated pneumonia  2. DVT Prophylaxis/Anticoagulation: Subcutaneous heparin. Monitor platelet counts any signs of bleeding  3. Mood/delirium. Xanax 0.25 mg every 6 hours as needed. Provide emotional support and positive reinforcement  4. Neuropsych: This patient Is capable of making decisions on his/her own behalf.  5. Dysphagia. Currently on dysphagia 3 nectar thick liquids. Maintaining hydration. No intolerance issues yet 6. Obesity. Dietary followup  7. Anemia. Anemia workup consistent with iron deficiency anemia with no evidence of bleeding. Followup CBC  With improved hgb  - continue iron supplement  8. Pruritus: benadyrl, sarna 9. Insomnia: prn trazodone 10. Urine: ua equivocal, culture pending 11. Pulmonary: wean oxygen to off.  LOS (Days) 4 A FACE TO FACE EVALUATION WAS PERFORMED  SWARTZ,ZACHARY T 04/18/2012 7:28 AM

## 2012-04-18 NOTE — Progress Notes (Signed)
Physical Therapy Note  Patient Details  Name: Megan Espinoza MRN: [id number] Date of Birth: 11/06/1955 Today's Date: 04/18/2012  1330-1355 (55 minutes) individual Pain: no reported pain Oxygen sats (resting) 91% RA  Pulse 112-116  Focus of treatment: Therapeutic exercise focused on activity tolerance Treatment: sit to stand from wc min to SBA; Nustep Level 3 X 3 minutes (Oxygen stats 86-88%  RA; X 2 minutes (Oxygen sats 88%  RA)   1555 Pt sitting in wc . Discussed LTGs with patient including gait goal. Pt states she is too tired to participate in another therapy session. Oxygen sats at rest 90%  RA.   Wajiha Versteeg,JIM 04/18/2012, 7:29 AM

## 2012-04-19 ENCOUNTER — Encounter (HOSPITAL_COMMUNITY): Payer: Self-pay | Admitting: Occupational Therapy

## 2012-04-19 ENCOUNTER — Inpatient Hospital Stay (HOSPITAL_COMMUNITY): Payer: MEDICAID | Admitting: Speech Pathology

## 2012-04-19 ENCOUNTER — Inpatient Hospital Stay (HOSPITAL_COMMUNITY): Payer: MEDICAID | Admitting: Physical Therapy

## 2012-04-19 ENCOUNTER — Inpatient Hospital Stay (HOSPITAL_COMMUNITY): Payer: MEDICAID

## 2012-04-19 NOTE — Progress Notes (Signed)
Social Work Patient ID: [name], female   DOB: 01-06-1956, 56 y.o.   MRN: [id number]  Met with patient this afternoon to review team conference information.  Pt aware and agreeable with targeted d/c 04/23/12, however, admits she had hoped it would be sooner.  Reports family and neighbor planning to provide intermittent assistance as needed.  Pt denies any concerns about d/c and managing at home.  Will continue to follow.  Jamielynn Wigley, LCSW

## 2012-04-19 NOTE — Progress Notes (Signed)
Speech Language Pathology Daily Session Note  Patient Details  Name: Megan Espinoza MRN: [id number] Date of Birth: 09-Mar-1956  Today's Date: 04/19/2012 Time: 1145-1200 Time Calculation (min): 15 min  Short Term Goals: Week 1: SLP Short Term Goal 1 (Week 1): Patient will consume recommended diet without observed signs or symptoms of aspiration with supervision cues. SLP Short Term Goal 1 - Progress (Week 1): Progressing toward goal SLP Short Term Goal 2 (Week 1): Patient will return demonstration of safe swallowing/compensatory strategies with supervision. SLP Short Term Goal 2 - Progress (Week 1): Progressing toward goal SLP Short Term Goal 3 (Week 1): Patient will demonstrate recall of strategies for safe transfers with min cues.  Skilled Therapeutic Interventions: Group session, co-treatment with OT; SLP facilitated session by addressing dysphagia goals. Patient consumed regular textures and thin liquids with independence.  Patient observed utilizing a straw and consuming mixed consistencies.  Patient exhibited cough x1 during p.o. intake; however, cough was hard and appeared to remove potential penetrates.      FIM:  Comprehension Comprehension Mode: Auditory Comprehension: 7-Follows complex conversation/direction: With no assist Expression Expression Mode: Verbal Expression: 6-Expresses complex ideas: With extra time/assistive device Social Interaction Social Interaction: 6-Interacts appropriately with others with medication or extra time (anti-anxiety, antidepressant). Problem Solving Problem Solving: 6-Solves complex problems: With extra time Memory Memory: 5-Requires cues to use assistive device FIM - Eating Eating Activity: 7: Complete independence:no helper  Pain Pain Assessment Pain Assessment: No/denies pain  Therapy/Group: Group Therapy  [name], CCC-SLP [phone]  [name] 04/19/2012, 1:40 PM

## 2012-04-19 NOTE — Progress Notes (Signed)
Social Work Assessment and Plan Social Work Assessment and Plan  Patient Details  Name: Megan Espinoza MRN: [id number] Date of Birth: [birth year]  Today's Date: 04/19/2012  Problem List:  Patient Active Problem List  Diagnosis  . Microcytic anemia  . Obesity (BMI 30.0-34.9)  . Acute respiratory failure with hypoxia  . ARDS (adult respiratory distress syndrome)  . Steroid-induced hyperglycemia  . Acute renal insufficiency  . Influenza A  . Physical deconditioning   Past Medical History:  Past Medical History  Diagnosis Date  . Obesity (BMI 30.0-34.9)   . Influenza, pneumonia December 2013    Associated with ARDS, Septic shock   Past Surgical History:  Past Surgical History  Procedure Date  . Cholecystectomy   . Hernia repair    Social History:  reports that she has never smoked. She does not have any smokeless tobacco history on file. She reports that she drinks alcohol. She reports that she does not use illicit drugs.  Family / Support Systems Patient Roles: Partner;Parent (employee) Spouse/Significant Other: boyfriend, Cristela Blue @ [phone] Children: daughter, Barrie Folk @ [phone] Anticipated Caregiver: Alinda Money when not working and daughter, Victorino Dike Ability/Limitations of Caregiver: Alinda Money works Engineer, structural Availability: Intermittent Family Dynamics: good support from boyfriend and daughter, however, another daughter is currently hospitalized (behavioral health) which causes a stressor for pt.  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  Speak openly about the death of her son while she has been in the hospital. Feels she has adequate support from family and son's church in coping with this loss.  Very focused on gettin out of the hospital and back to work ASAP. Recent Psychosocial Issues: Son was diagnosed with CA in August of this year with very quick decline and died while pt in the hospital.  Daughter has been diagnosed with bipolar and currently hospitalized. Pyschiatric History: None Substance Abuse History: None  Patient / Family Perceptions, Expectations & Goals Pt/Family understanding of illness & functional limitations: Pt and family with good, general understanding of her medical issues and current level of deconditioning/ need for CIR therapies. Premorbid pt/family roles/activities: Pt was working p/t and active/ independent Anticipated changes in roles/activities/participation: pt goals are set for modified independnt .  endurance to be her preimary limiting factir. Pt/family expectations/goals: "Go home"  [employer]: None Premorbid Home Care/DME Agencies: None Transportation available at discharge: yes Resource referrals recommended: Neuropsychology  Discharge Planning Living Arrangements: Spouse/significant other;Children (Melvin BF of 12 years) Support Systems: Spouse/significant other;Children;Friends/neighbors;Church/faith community Type of Residence: Private residence [employer] Resources: Customer service manager Resources: Employment;Family Support Financial Screen Referred: Previously completed Living Expenses: Rent Money Management: Patient;Significant Other Do you have any problems obtaining your medications?: Yes (Describe) Home Management: pt and family to share Patient/Family Preliminary Plans: Pt plans to return home with her boyfriend and daughter  plus assistance from neighbor Social Work Anticipated Follow Up Needs: HH/OP Expected length of stay: ELOS 2 weeks  Clinical Impression Talkative, focused woman here after resp failure with primary goal to get home ASAP.  Does accept that she will need to be here a few  days to rebuild overall strength.  Dealing with death of son while she has been here (CA) but good emotional support available to her now and after d/c.  Anticipate short LOS>   Ethyn Schetter 04/19/2012, 5:10 PM

## 2012-04-19 NOTE — Progress Notes (Signed)
Subjective/Complaints: Good night. Encouraged by the fact that she was off oxygen a bit yesterday. A 12 point review of systems has been performed and if not noted above is otherwise negative.   Objective: Vital Signs: Blood pressure 147/81, pulse 90, temperature 98.4 F (36.9 C), temperature source Oral, resp. rate 19, SpO2 100.00%. No results found. No results found for this basename: WBC:2,HGB:2,HCT:2,PLT:2 in the last 72 hours No results found for this basename: NA:2,K:2,CL:2,CO:2,GLUCOSE:2,BUN:2,CREATININE:2,CALCIUM:2 in the last 72 hours CBG (last 3)  No results found for this basename: GLUCAP:3 in the last 72 hours  Wt Readings from Last 3 Encounters:  04/14/12 115.849 kg (255 lb 6.4 oz)    Physical Exam:  HENT:  Head: Normocephalic.  Eyes:  Pupils round and reactive to light  Neck: Normal range of motion. Neck supple. No thyromegaly present.  Cardiovascular: Normal rate and regular rhythm.  Pulmonary/Chest: Bilateral crackles Decreased breath sounds at the bases with limited inspiratory effort  Abdominal: Soft. Bowel sounds are normal. She exhibits no distension.  Obese  Neurological: She is alert.  Generally appropriate. Reasonable insight and awareness. Skin: Skin is warm and dry. No visible rash. Skin a little dry Motor strength is 4/5 in bilateral deltoid, biceps, triceps, grip, 3-4   hip flexors,Knee extensors, 4 minus ankle dorsiflexors and plantar flexors  Sensation is intact to light touch in both upper and lower limbs. DTR's are 1+   Assessment/Plan: 1. Functional deficits secondary to severe deconditioning after pneumonia, respiratory failure which require 3+ hours per day of interdisciplinary therapy in a comprehensive inpatient rehab setting. Physiatrist is providing close team supervision and 24 hour management of active medical problems listed below. Physiatrist and rehab team continue to assess barriers to discharge/monitor patient progress toward  functional and medical goals.  Discussed FULL participation with therapies.  FIM: FIM - Bathing Bathing Steps Patient Completed: Chest;Right Arm;Left Arm;Abdomen;Right upper leg;Left upper leg (in w/c at sink) Bathing: 3: Mod-Patient completes 5-7 5f 10 parts or 50-74% (required several rest breaks and extra time)  FIM - Upper Body Dressing/Undressing Upper body dressing/undressing steps patient completed: Thread/unthread left sleeve of pullover shirt/dress;Put head through opening of pull over shirt/dress;Pull shirt over trunk;Thread/unthread right sleeve of pullover shirt/dresss Upper body dressing/undressing: 5: Supervision: Safety issues/verbal cues FIM - Lower Body Dressing/Undressing Lower body dressing/undressing steps patient completed: Thread/unthread right underwear leg;Thread/unthread right pants leg Lower body dressing/undressing: 1: Total-Patient completed less than 25% of tasks  FIM - Toileting Toileting: 0: Activity did not occur  FIM - Diplomatic Services operational officer Devices: Grab bars Toilet Transfers: 0-Activity did not occur  FIM - Banker Devices: HOB elevated;Bed rails Bed/Chair Transfer: 5: Supine > Sit: Supervision (verbal cues/safety issues);4: Bed > Chair or W/C: Min A (steadying Pt. > 75%)  FIM - Locomotion: Wheelchair Locomotion: Wheelchair: 0: Activity did not occur FIM - Locomotion: Ambulation Locomotion: Ambulation Assistive Devices: Designer, industrial/product Ambulation/Gait Assistance: 1: +2 Total assist;3: Mod assist Locomotion: Ambulation: 0: Activity did not occur  Comprehension Comprehension Mode: Auditory Comprehension: 7-Follows complex conversation/direction: With no assist  Expression Expression Mode: Verbal Expression: 6-Expresses complex ideas: With extra time/assistive device  Social Interaction Social Interaction: 6-Interacts appropriately with others with medication or extra time  (anti-anxiety, antidepressant).  Problem Solving Problem Solving: 5-Solves complex 90% of the time/cues < 10% of the time  Memory Memory: 5-Recognizes or recalls 90% of the time/requires cueing < 10% of the time  Medical Problem List and Plan:  1. Deconditioning after respiratory  failure with associated pneumonia  2. DVT Prophylaxis/Anticoagulation: Subcutaneous heparin. Monitor platelet counts any signs of bleeding  3. Mood/delirium. Xanax 0.25 mg every 6 hours as needed. Provide emotional support and positive reinforcement. Coping with a lot right now. 4. Neuropsych: This patient Is capable of making decisions on his/her own behalf.  5. Dysphagia. Currently on dysphagia 3 nectar thick liquids. Maintaining hydration. No intolerance issues yet 6. Obesity. Dietary followup  7. Anemia. Anemia workup consistent with iron deficiency anemia with no evidence of bleeding. Followup CBC  With improved hgb  - continue iron supplement   -having tachycardia with activity. Will monitor closely for now 8. Pruritus: benadyrl, sarna 9. Insomnia: prn trazodone 10. Urine: u culture negative 11. Pulmonary: wean oxygen to off as able. reivewed IS, deep breathing etc.  LOS (Days) 5 A FACE TO FACE EVALUATION WAS PERFORMED  Megan Espinoza T 04/19/2012 8:09 AM

## 2012-04-19 NOTE — Progress Notes (Signed)
Physical Therapy Note  Patient Details  Name: Megan Espinoza MRN: [id number] Date of Birth: 1955-09-09 Today's Date: 04/19/2012  Time: 1400-1439 39 minutes  No c/o pain.  Pt c/o fatigue, "I already did a shower and the eating thing today, I don't know how much more I can do".  Pt agreeable to participate with encouragement.  Gait x 3 attempts 15-20' with supervision with RW.  O2 sats 88% after gait on room air, returned to >90% with <60 seconds seated rest.  Car transfer training to sedan simulated height.  Pt able to get into car with min A for B LEs.  Pt required mod A to stand from sedan height car.  Discussed with pt/sister attempting real car transfer before d/c, pt agreeable and will speak to husband to schedule a time.  W/c mobility multiple attempts of < 50' with min A for steering, pt limited by fatigue and UE weakness.  Sit to stand training from w/c height x 5 reps with prolonged rests between each transfer.  Pt unable/ unwilling to complete full session because of anxiety and c/o fatigue.  Individual therapy   [name] 04/19/2012, 2:38 PM

## 2012-04-19 NOTE — Progress Notes (Signed)
Speech Language Pathology Daily Session Note  Patient Details  Name: Megan Espinoza MRN: [id number] Date of Birth: [birth year]  Today's Date: 04/19/2012 Time: 0935-1000 Time Calculation (min): 25 min  Short Term Goals: Week 1: SLP Short Term Goal 1 (Week 1): Patient will consume recommended diet without observed signs or symptoms of aspiration with supervision cues. SLP Short Term Goal 1 - Progress (Week 1): Progressing toward goal SLP Short Term Goal 2 (Week 1): Patient will return demonstration of safe swallowing/compensatory strategies with supervision. SLP Short Term Goal 2 - Progress (Week 1): Progressing toward goal SLP Short Term Goal 3 (Week 1): Patient will demonstrate recall of strategies for safe transfers with min cues.  Skilled Therapeutic Interventions: Skilled treatment session focused on addressing dysphagia goals and education.  SLP facilitated session with trials of regular textures and thin liquids via straw with no overt s/s of aspiration; however, SLP recommends skilled observation with full meal prior to discharge of swallowing goals.  SLP also completed education regarding previously taught vocal hygiene program and patient was able to verbalize 2/3 strategies and with cued recalled third with use of handout.     FIM:  Comprehension Comprehension Mode: Auditory Comprehension: 7-Follows complex conversation/direction: With no assist Expression Expression Mode: Verbal Expression: 6-Expresses complex ideas: With extra time/assistive device Social Interaction Social Interaction: 6-Interacts appropriately with others with medication or extra time (anti-anxiety, antidepressant). Problem Solving Problem Solving: 6-Solves complex problems: With extra time Memory Memory: 5-Requires cues to use assistive device FIM - Eating Eating Activity: 7: Complete independence:no helper  Pain Pain Assessment Pain Assessment: No/denies pain  Therapy/Group: Individual  Therapy  [name], CCC-SLP [phone]  Loetta Connelley 04/19/2012, 1:36 PM

## 2012-04-19 NOTE — Progress Notes (Signed)
Occupational Therapy Session Note  Patient Details  Name: Megan Espinoza MRN: [id number] Date of Birth: 1956-03-14  Today's Date: 04/19/2012 Time: 1130-1145 Time Calculation (min): 15 min  Short Term Goals: Week 1:  OT Short Term Goal 1 (Week 1): Pt will transfer to toilet / BSC with min A  OT Short Term Goal 2 (Week 1): Pt will do LB dressing with mod A with extra time OT Short Term Goal 3 (Week 1): Pt will perform sit to stand for clothing management with min a OT Short Term Goal 4 (Week 1): Pt will perform toileting wtih mod A  Skilled Therapeutic Interventions/Progress Updates:   Pt seen for self feeding today during diners club. Pt able to retrieve items off of tray and open containers. Pt ate 90% of sandwich excluding crust.  Therapy Documentation Precautions:  Precautions Precautions: Fall Restrictions Weight Bearing Restrictions: No     Pain: No pain reported.         See FIM for current functional status  Therapy/Group: Group Therapy  [name] 04/19/2012, 12:56 PM

## 2012-04-19 NOTE — Patient Care Conference (Signed)
Inpatient RehabilitationTeam Conference and Plan of Care Update Date: 04/18/2012   Time: 2:35 PM    Patient Name: Megan Espinoza      Medical Record Number: [id number]  Date of Birth: 1955/05/22 Sex: Female         Room/Bed: 4030/4030-01 Payor Info: Payor: MEDICAID POTENTIAL  Plan: MEDICAID POTENTIAL  Product Type: *No Product type*     Admitting Diagnosis: Deconditioned after flu/resp failure  Admit Date/Time:  04/14/2012  4:04 PM Admission Comments: No comment available   Primary Diagnosis:  Physical deconditioning Principal Problem: Physical deconditioning  Patient Active Problem List   Diagnosis Date Noted  . Physical deconditioning 04/17/2012  . Influenza A 03/31/2012  . Acute renal insufficiency 03/30/2012  . ARDS (adult respiratory distress syndrome) 03/28/2012  . Steroid-induced hyperglycemia 03/28/2012  . Acute respiratory failure with hypoxia 03/27/2012  . Microcytic anemia 03/24/2012  . Obesity (BMI 30.0-34.9) 03/24/2012    Expected Discharge Date: Expected Discharge Date: 04/23/12  Team Members Present: Physician leading conference: Dr. Faith Rogue Social Worker Present: Amada Jupiter, LCSW Nurse Present: Daryll Brod, RN PT Present: Reggy Eye, PT OT Present: Roney Mans, OT;Ardis Rowan, COTA SLP Present: Fae Pippin, SLP     Current Status/Progress Goal Weekly Team Focus  Medical   improving pulmonary status. still on oxygen. resolving encephalopathy  increase exercise stamina and strength while maintaing medical issues  wean oxygen, improve sleep   Bowel/Bladder   Incontinet of bladder. Continent of bowel, per report. LBM 12/29  Continent of bladder  Timed toileting q 2hrs    Swallow/Nutrition/ Hydration   regular and thin upgraded 04/16/12  leas restrictive p.o. intake   monitor for safety and toleration 1-2 more days   ADL's   min A overall   supervision overall   activity tolerance, weaning off supplemental O2, sit to stands,  functional ambulation   Mobility   min A  mod I  activity tolerance   Communication   mod I   carryover of taught vocal hygiene program  education    Safety/Cognition/ Behavioral Observations  supervision-min assist   supervision   increase carryover/use of external aids   Pain   Tylenol 650mg  q 4hrs  <3  Assess for effectiveness and frequency   Skin   CDI  No skin breakdown  Routine turn, reposition, q 2hrs    Rehab Goals Patient on target to meet rehab goals: Yes *See Interdisciplinary Assessment and Plan and progress notes for long and short-term goals  Barriers to Discharge: consistent participation in therapies    Possible Resolutions to Barriers:  setting dc goals and parameters with therapy to help drive her.     Discharge Planning/Teaching Needs:  Home with boyfriend and family to provide intermittent assistance.      Team Discussion:  Pt with inconsistency in participation.  HR elevated - MD setting parameters for activity.  Hopeful to have her off of O2 at d/c.    Revisions to Treatment Plan:  Neuropsych evaluation scheduled.   Continued Need for Acute Rehabilitation Level of Care: The patient requires daily medical management by a physician with specialized training in physical medicine and rehabilitation for the following conditions: Daily direction of a multidisciplinary physical rehabilitation program to ensure safe treatment while eliciting the highest outcome that is of practical value to the patient.: Yes Daily medical management of patient stability for increased activity during participation in an intensive rehabilitation regime.: Yes Daily analysis of laboratory values and/or radiology reports with any subsequent need for  medication adjustment of medical intervention for : Pulmonary problems;Neurological problems;Other (insomina)  Saquoia Sianez 04/19/2012, 4:45 PM

## 2012-04-19 NOTE — Progress Notes (Addendum)
Occupational Therapy Session Note  Patient Details  Name: Megan Espinoza MRN: [id number] Date of Birth: 07/23/1955  Today's Date: 04/19/2012 Time: 1015-1110 Time Calculation (min): 55 min  Short Term Goals: Week 1:  OT Short Term Goal 1 (Week 1): Pt will transfer to toilet / BSC with min A  OT Short Term Goal 2 (Week 1): Pt will do LB dressing with mod A with extra time OT Short Term Goal 3 (Week 1): Pt will perform sit to stand for clothing management with min a OT Short Term Goal 4 (Week 1): Pt will perform toileting wtih mod A  Skilled Therapeutic Interventions/Progress Updates:    1:1 self care retraining at shower level: focus on functional ambulation with RW in and out of bathroom, sit to stands, standing tolerance and balance without UE support, activity tolerance/ endurance training, shower stall transfers, discussion about home environment and bathroom setup. Tedra Coupe who will be there at home prn to assist as needed; discussed bathroom equipment- pt has a tub bench for tub shower and friend has a shower chair she could borrow. Pt o2 93% before shower on RA; after shower 85% on RA but with deep breathing - pt able to return to 90 with rest breaks.  Therapy Documentation Precautions:  Precautions Precautions: Fall Restrictions Weight Bearing Restrictions: No Pain:  no c/o pain  See FIM for current functional status  Therapy/Group: Individual Therapy  Roney Mans [facility] 04/19/2012, 11:08 AM

## 2012-04-19 NOTE — Progress Notes (Signed)
Speech Language Pathology Discharge Summary  Patient Details  Name: CHINENYE KATZENBERGER MRN: [id number] Date of Birth: Dec 08, 1955  Today's Date: 04/19/2012  Patient has met 5 of 5 long term goals.  Patient to discharge at overall Independent level.  Reasons goals not met: n/a   Clinical Impression/Discharge Summary: Patient met 5 out of 5 long term goals during CIR stay due to functional gains in swallow, recall, awareness and voice.  Patient is consuming regular and thin liquids indepenently.  Patient has also exhibited improved vocal quality with education completed on vocal hygiene program as well as overall awareness and recall.  SLP recommends follow up with ENT if vocal quality has not returned to [facility] in about 4 weeks.    Care Partner:  Caregiver Able to Provide Assistance: Yes  Type of Caregiver Assistance: Physical  Recommendation:  Other (comment) (Intermittent supervision )  Rationale for SLP Follow Up: Other (comment) (ENT in 4 weeks if dysphonia persists)   Equipment: none   Reasons for discharge: Treatment goals met   Patient/Family Agrees with Progress Made and Goals Achieved: Yes   See FIM for current functional status  [name], CCC-SLP [phone]  Johnell Landowski 04/19/2012, 1:51 PM

## 2012-04-19 NOTE — Consult Note (Signed)
04/18/12  NEUROBEHAVIORAL STATUS EXAM - [facility] [place] [facility]   MEDICAL NECESSITY:  [name] was seen on the [facility] for a neurobehavioral status exam owing to functional deficits secondary to severe deconditioning after pneumonia and respiratory failure, and in order to assist in treatment planning.   [name] was seen to evaluate her cognitive and emotional functioning, as she has reportedly displayed depressive and anxiety-related symptoms. Of note, her son passed away during this admission and she suffers from significant bereavement. She also suffers from fatigue and perceived issues with mobility and fine motor dexterity. However, according to her physical therapists, [name] requires more assistance than she should given her present medical status.   During this appointment, [name] expressed concern that owing to the diminutive figures of her therapists that she could hurt them during therapy. This makes her feel "out of control" which is a big deal to her and can lead to fear, though she purportedly feels less scared today. [name] described her endurance as low but thinks that she is making strides in therapy. 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   [name] reported that the staff believes that she has refused treatment, and she disagreed. She said that on one instance she could not attend the "eating group" because she was waiting for an individual who was running late to meet with her about establishing a new POA. However, she admitted that she did refuse therapy on another day because she was on the phone with a number of people who were all speaking about her son, so she was too emotionally drained to participate.   [name] denied ever being diagnosed with or treated for a mental health  disorder. She was noticeably tearful when speaking about her recently deceased son. She has suffered many losses in her life. For example, her first husband committed suicide. She has also had to deal with her daughter's mental health issues as she has been diagnosed with Bipolar disorder.   PROCEDURES ADMINISTERED: [2 units [id number] on 04/18/12] Diagnostic clinical interview  Review of available records Beck Depression Inventory-Fast Screen (for medical patients) Mental Status Exam-2 (brief version)  MENTAL STATUS: [name]'s mental status exam score of 16/16 was above the cutoff used to indicate severe cognitive impairment or dementia.   Cognitive, Emotional & Behavioral Evaluation: [name] was appropriately dressed for season and situation, and she appeared fairly well-groomed. Normal posture was noted. She was friendly and rapport was easily established. Her speech was as expected and she was able to express ideas effectively. She seemed to understand test directions readily. Her affect was appropriately modulated but she was tearful when speaking about her son. Attention and motivation were good.   From an emotional standpoint, on brief self report inventories, [name] endorsed experiencing at least a mild degree of depression and a moderate to severe degree of bereavement. Suicidal/homicidal ideation, plan or intent was denied. No manic or hypomanic episodes were reported. She denied ever experiencing any auditory/visual hallucinations. No behavioral or personality changes were endorsed.   Overall, [name]' reported lack of participation likely stems from long-standing personality traits and recent bereavement. It is my hope that if we tailor the way we work with her that she will participate more completely.    In light of these findings, the following recommendations are provided.  RECOMMENDATIONS  Recommendations for treatment team:    [name] would do better  feeling that she has more control over her situation; including her therapy. What I recommend is that the staff continues to encourage her to participate but not push her too far when she says that she has reached her limit. Simply reiterate that certain goals have to be met for her to discharge on time. And if she then again states that she has been pushed too far, consider discontinuing that particular task.    She would do well to be able to sleep in more if possible. As such, consider arranging her schedule for a later start time. If this is not possible, then please emphasize to her the importance of starting at a particular time and explain why it needs to be done.    Brief social and ego support is warranted during this admission.   Recommendations for discharge planning:     Help her establish grief counseling if she wishes, though she plans to speak to her church bishop.    Maintain engagement in mentally, physically and cognitively stimulating activities.    Strive to maintain a healthy lifestyle (e.g., proper diet and exercise) in order to promote physical, cognitive and emotional health.   REFERRING DIAGNOSIS: Physical deconditioning  FINAL DIAGNOSES:  Physical deconditioning Depressive disorder, NOS (bereavement)   Debbe Mounts, Psy.D.  Clinical Neuropsychologist

## 2012-04-20 ENCOUNTER — Encounter (HOSPITAL_COMMUNITY): Payer: Self-pay | Admitting: Occupational Therapy

## 2012-04-20 ENCOUNTER — Inpatient Hospital Stay (HOSPITAL_COMMUNITY): Payer: MEDICAID

## 2012-04-20 ENCOUNTER — Inpatient Hospital Stay (HOSPITAL_COMMUNITY): Payer: MEDICAID | Admitting: Occupational Therapy

## 2012-04-20 ENCOUNTER — Inpatient Hospital Stay (HOSPITAL_COMMUNITY): Payer: MEDICAID | Admitting: *Deleted

## 2012-04-20 DIAGNOSIS — R5381 Other malaise: Secondary | ICD-10-CM

## 2012-04-20 NOTE — Progress Notes (Signed)
Occupational Therapy Session Note  Patient Details  Name: Megan Espinoza MRN: [id number] Date of Birth: 11-23-1955  Today's Date: 04/20/2012 Time: 1105-1200 Time Calculation (min): 55 min  Short Term Goals: Week 1:  OT Short Term Goal 1 (Week 1): Pt will transfer to toilet / BSC with min A  OT Short Term Goal 2 (Week 1): Pt will do LB dressing with mod A with extra time OT Short Term Goal 3 (Week 1): Pt will perform sit to stand for clothing management with min a OT Short Term Goal 4 (Week 1): Pt will perform toileting wtih mod A  Skilled Therapeutic Interventions/Progress Updates:  Patient resting in w/c upon arrival with husband visiting.  Engaged in self care retraining to include shower and dress with husband leaving the room.  Focused session on activity tolerance, pursed lip breathing, sit><stands, dynamic standing balance, adaptive techniques for LB dressing.  Patient declined OT to bring in AE to practice and also declined use of O2 during session.  Patient left in w/c with call bell and phone within reach and lunch tray set in front of her.  Therapy Documentation Precautions:  Precautions Precautions: Fall Restrictions Weight Bearing Restrictions: No Pain: 2/10 knee pain, premedicated ADL: See FIM for current functional status  Therapy/Group: Individual Therapy  Jenevie Casstevens 04/20/2012, 12:03 PM

## 2012-04-20 NOTE — Progress Notes (Signed)
Occupational Therapy Note  Patient Details  Name: Megan Espinoza MRN: [id number] Date of Birth: 01/05/1956 Today's Date: 04/20/2012  Time: 1300-1355 Pt denies pain Individual Therapy Pt engaged in funcitonal amb with RW for simulated walk-in shower transfers, home mgmt tasks, and bed mobility.  Pt required multiple rest breaks throughout tasks.  HR=120-140, O2 sats >90% on RA.  Pt states her stamina is not good and she worries about that.  Pt transitioned to BUE exercises on ergometer (30 secs X 1, 1 min X 1, 1.5 min X 1, 2 min X 1, 3 min X 1) with 2 min rest breaks between sets.  HR remained within earlier ranges.  Pt exhibits SOB with activity with shallow respirations.     Lavone Neri [facility] 04/20/2012, 2:20 PM

## 2012-04-20 NOTE — Progress Notes (Addendum)
Subjective/Complaints: "can i stop heparin injections?" A 12 point review of systems has been performed and if not noted above is otherwise negative.   Objective: Vital Signs: Blood pressure 148/63, pulse 98, temperature 98.3 F (36.8 C), temperature source Oral, resp. rate 18, SpO2 97.00%. No results found. No results found for this basename: WBC:2,HGB:2,HCT:2,PLT:2 in the last 72 hours No results found for this basename: NA:2,K:2,CL:2,CO:2,GLUCOSE:2,BUN:2,CREATININE:2,CALCIUM:2 in the last 72 hours CBG (last 3)  No results found for this basename: GLUCAP:3 in the last 72 hours  Wt Readings from Last 3 Encounters:  04/14/12 115.849 kg (255 lb 6.4 oz)    Physical Exam:  HENT:  Head: Normocephalic.  Eyes:  Pupils round and reactive to light  Neck: Normal range of motion. Neck supple. No thyromegaly present.  Cardiovascular: Normal rate and regular rhythm.  Pulmonary/Chest: Bilateral crackles Decreased breath sounds at the bases with limited inspiratory effort  Abdominal: Soft. Bowel sounds are normal. She exhibits no distension.  Obese  Neurological: She is alert.  Generally appropriate. Reasonable insight and awareness. Skin: Skin is warm and dry. No visible rash. Skin a little dry Motor strength is 4/5 in bilateral deltoid, biceps, triceps, grip, 3-4   hip flexors,Knee extensors, 4 minus ankle dorsiflexors and plantar flexors  Sensation is intact to light touch in both upper and lower limbs. DTR's are 1+   Assessment/Plan: 1. Functional deficits secondary to severe deconditioning after pneumonia, respiratory failure which require 3+ hours per day of interdisciplinary therapy in a comprehensive inpatient rehab setting. Physiatrist is providing close team supervision and 24 hour management of active medical problems listed below. Physiatrist and rehab team continue to assess barriers to discharge/monitor patient progress toward functional and medical goals.  Discussed FULL  participation with therapies.  FIM: FIM - Bathing Bathing Steps Patient Completed: Chest;Right Arm;Left Arm;Abdomen;Front perineal area;Buttocks;Right upper leg;Left upper leg Bathing: 4: Min-Patient completes 8-9 68f 10 parts or 75+ percent  FIM - Upper Body Dressing/Undressing Upper body dressing/undressing steps patient completed: Thread/unthread right sleeve of pullover shirt/dresss;Thread/unthread left sleeve of pullover shirt/dress;Pull shirt over trunk;Put head through opening of pull over shirt/dress Upper body dressing/undressing: 5: Set-up assist to: Obtain clothing/put away FIM - Lower Body Dressing/Undressing Lower body dressing/undressing steps patient completed: Thread/unthread right underwear leg;Thread/unthread left underwear leg;Pull underwear up/down;Thread/unthread right pants leg;Thread/unthread left pants leg;Pull pants up/down;Don/Doff right shoe;Don/Doff left shoe;Fasten/unfasten right shoe;Fasten/unfasten left shoe Lower body dressing/undressing: 5: Set-up assist to: Don/Doff TED stocking  FIM - Toileting Toileting: 0: Activity did not occur  FIM - Diplomatic Services operational officer Devices: Grab bars Toilet Transfers: 4-To toilet/BSC: Min A (steadying Pt. > 75%);4-From toilet/BSC: Min A (steadying Pt. > 75%)  FIM - Bed/Chair Transfer Bed/Chair Transfer Assistive Devices: HOB elevated;Bed rails Bed/Chair Transfer: 4: Bed > Chair or W/C: Min A (steadying Pt. > 75%);4: Chair or W/C > Bed: Min A (steadying Pt. > 75%)  FIM - Locomotion: Wheelchair Locomotion: Wheelchair: 1: Travels less than 50 ft with minimal assistance (Pt.>75%) FIM - Locomotion: Ambulation Locomotion: Ambulation Assistive Devices: Designer, industrial/product Ambulation/Gait Assistance: 1: +2 Total assist;3: Mod assist Locomotion: Ambulation: 1: Travels less than 50 ft with supervision/safety issues  Comprehension Comprehension Mode: Auditory Comprehension: 7-Follows complex  conversation/direction: With no assist  Expression Expression Mode: Verbal Expression: 6-Expresses complex ideas: With extra time/assistive device  Social Interaction Social Interaction: 6-Interacts appropriately with others with medication or extra time (anti-anxiety, antidepressant).  Problem Solving Problem Solving: 6-Solves complex problems: With extra time  Memory Memory: 5-Recognizes or recalls  90% of the time/requires cueing < 10% of the time  Medical Problem List and Plan:  1. Deconditioning after respiratory failure with associated pneumonia  2. DVT Prophylaxis/Anticoagulation: Subcutaneous heparin-can dc given bruising and increased mobility 3. Mood/delirium. Xanax 0.25 mg every 6 hours as needed. Provide emotional support and positive reinforcement.  4. Neuropsych: This patient Is capable of making decisions on his/her own behalf.  5. Dysphagia. Advanced to regular diet 6. Obesity. Dietary followup  7. Anemia. Anemia workup consistent with iron deficiency anemia with no evidence of bleeding. Followup CBC  With improved hgb  - continue iron supplement   -having tachycardia with activity. Will monitor closely for now 8. Pruritus: benadyrl, sarna 9. Insomnia: prn trazodone 10. Urine: u culture negative, timed voids 11. Pulmonary: wean oxygen to off as able. IS  LOS (Days) 6 A FACE TO FACE EVALUATION WAS PERFORMED  Megan Espinoza 04/20/2012 8:00 AM

## 2012-04-20 NOTE — Progress Notes (Signed)
Physical Therapy Session Note  Patient Details  Name: Megan Espinoza MRN: [id number] Date of Birth: 23-Dec-1955  Today's Date: 04/20/2012 Time: 4132-4401 Time Calculation (min): 39 min  Short Term Goals: Week 1:  PT Short Term Goal 1 (Week 1): Pt will be able to transfer consistently with min A PT Short Term Goal 2 (Week 1): Pt will be able to gait x 25' with min A PT Short Term Goal 3 (Week 1): Pt will be able to demonstrate dynamic standing balance with min A for functional tasks PT Short Term Goal 4 (Week 1): Pt will be able to go up/down 3 steps  with mod A  Skilled Therapeutic Interventions/Progress Updates:    Patient received sitting in wheelchair. Discussion with patient about her anxiety levels about therapy. Patient reports she feels like she is "going to have a panic attack before every therapy session." Patient instructed in gait training 30'x1 and 12'x1, but refuses additional gait training for remainder of session. Attempted to initiate stair training, but patient refused stating "I already got over the lip in the shower this morning, so that's enough for today." Addtionally, patient refused negotiation of curb.  Patient agreeable to seated and standing LE there ex: standing heel raises 2x20, mini squats 1x10, seated knee extension (patient refused use of weight) 2x20, ankle pumps 2x20. Patient requires multiple rest breaks in between exercises secondary to HR into high 120s. SpO2 level drops to 90% after 30' of gait training, but never drop below 92% for whole session.  Patient refused w/c mobility to propel back to room. Patient returned to room and left seated in wheelchair and all needs within reach.   Therapy Documentation Precautions:  Precautions Precautions: Fall Precaution Comments: high resting HR Restrictions Weight Bearing Restrictions: No Vital Signs: Therapy Vitals Pulse Rate: 109  Patient Position, if appropriate: Sitting Oxygen Therapy SpO2: 96 % O2  Device: None (Room air) Pulse Oximetry Type: Intermittent Pain: Pain Assessment Pain Assessment: No/denies pain Pain Score: 0-No pain Mobility: Transfers Sit to Stand: 5: Supervision;From chair/3-in-1;With armrests;With upper extremity assist Sit to Stand Details: Verbal cues for precautions/safety Sit to Stand Details (indicate cue type and reason): Verbal cues for appropriate pacing and speed of movements, secondary to increased speed Stand to Sit: 5: Supervision;With upper extremity assist;To chair/3-in-1 Stand to Sit Details (indicate cue type and reason): Verbal cues for precautions/safety Stand to Sit Details: Verbal cues for appropriate pacing and speed of movements, secondary to increased speed; uncontrolled descent Stand Pivot Transfers: 5: Supervision Stand Pivot Transfer Details: Verbal cues for precautions/safety Locomotion : Ambulation Ambulation: Yes Ambulation/Gait Assistance: 5: Supervision Ambulation Distance (Feet): 30 Feet Assistive device: Rolling walker Gait Gait: Yes Gait Pattern: Step-through pattern;Trunk flexed;Narrow base of support Gait velocity: very fast gait speed Stairs / Additional Locomotion Stairs: No (Patient refused stairs) Wheelchair Mobility Wheelchair Mobility: Yes Wheelchair Assistance: 1: +1 Total assist Wheelchair Parts Management: Needs assistance Distance: 150   See FIM for current functional status  Therapy/Group: Individual Therapy  Chipper Herb. Fiorela Pelzer, PT, DPT  04/20/2012, 4:22 PM

## 2012-04-21 ENCOUNTER — Encounter (HOSPITAL_COMMUNITY): Payer: Self-pay | Admitting: Occupational Therapy

## 2012-04-21 ENCOUNTER — Inpatient Hospital Stay (HOSPITAL_COMMUNITY): Payer: MEDICAID | Admitting: Occupational Therapy

## 2012-04-21 ENCOUNTER — Inpatient Hospital Stay (HOSPITAL_COMMUNITY): Payer: MEDICAID | Admitting: Physical Therapy

## 2012-04-21 ENCOUNTER — Inpatient Hospital Stay (HOSPITAL_COMMUNITY): Payer: Self-pay | Admitting: Physical Therapy

## 2012-04-21 ENCOUNTER — Inpatient Hospital Stay (HOSPITAL_COMMUNITY): Payer: Self-pay | Admitting: *Deleted

## 2012-04-21 NOTE — Progress Notes (Signed)
Physical Therapy Session Note  Patient Details  Name: Megan Espinoza MRN: [id number] Date of Birth: 07/09/1955  Today's Date: 04/21/2012 Time: 9528-4132 Time Calculation (min): 19 min  Short Term Goals: Week 1:  PT Short Term Goal 1 (Week 1): Pt will be able to transfer consistently with min A PT Short Term Goal 2 (Week 1): Pt will be able to gait x 25' with min A PT Short Term Goal 3 (Week 1): Pt will be able to demonstrate dynamic standing balance with min A for functional tasks PT Short Term Goal 4 (Week 1): Pt will be able to go up/down 3 steps  with mod A  Skilled Therapeutic Interventions/Progress Updates:    This session focused on stair training with bil rails 3, 4" steps x 1 rep up/down (pt refused to do them again) min guard assist.  HR increased from 116 to 146 bpm after doing the stairs.  Pt very reluctant to try stairs and was yelling at therapist, " I guess I don't have a choice!"  I informed her she always had a choice, but that it would be good to not have the first time she tries the stairs be at home.  What would happen if she couldn't do them?  She reluctantly agreed.  We had a long discussion about what she would have to do tomorrow for discharge (goal checking) in order to leave on Saturday and that we would be asking a lot of her.  I also had a long discussion about the importance of staying mobile and working with the HHPT at home.  I then asked her to Wheel her WC back to her room.  "I can't!"  I asked her to try and she wheeled the [facility] 60' supervision with verbal cues on how to turn the WC.  After 48' pt shouted, "I just can't do anymore" and stopped participating in the session.  Missed 11 mins.    Therapy Documentation Precautions:  Precautions Precautions: Fall Precaution Comments: high resting HR, High anxiety, son died recently, very emotional Restrictions Weight Bearing Restrictions: No General: Amount of Missed PT Time (min): 11 Minutes Missed Time Reason:  Patient unwilling/refused to participate without medical reason Vital Signs: Therapy Vitals Temp: 98.5 F (36.9 C) Temp src: Oral Pulse Rate: 106  Resp: 18  BP: 139/84 mmHg Patient Position, if appropriate: Sitting Oxygen Therapy SpO2: 93 % O2 Device: None (Room air) Pain: Pain Assessment Pain Assessment: No/denies pain Mobility:   Locomotion : Ambulation Ambulation/Gait Assistance: 5: Supervision   See FIM for current functional status  Therapy/Group: Individual Therapy  Lurena Joiner B. Shamila Lerch, PT, DPT [phone]   04/21/2012, 4:07 PM

## 2012-04-21 NOTE — Progress Notes (Signed)
Physical Therapy Session Note  Patient Details  Name: Megan Espinoza MRN: [id number] Date of Birth: [birth year]  Today's Date: 04/21/2012 Time: 1123-1200 Time Calculation (min): 37 min  Short Term Goals: Week 1:  PT Short Term Goal 1 (Week 1): Pt will be able to transfer consistently with min A PT Short Term Goal 2 (Week 1): Pt will be able to gait x 25' with min A PT Short Term Goal 3 (Week 1): Pt will be able to demonstrate dynamic standing balance with min A for functional tasks PT Short Term Goal 4 (Week 1): Pt will be able to go up/down 3 steps  with mod A  Skilled Therapeutic Interventions/Progress Updates:  Patient performed WC propulsion for improved endurance in controlled environment with mod A for additional force 80'.  Sit/stand WC/RW and stand pivot transfers with RW and supervision.  Patient worked on NuStep to increase LE strength and improve endurance at level 3 for 6 minutes with 3 minute rest break half way through.  Patient reported moderate exertion during activity.  HR increased to 131 with activity, decreased to 123 with rest.  Stand pivot transfer WC/mat table with supervision.  Patient performed seated LE strengthening:  Toe/heel raises, LAQ x10 each; standing mini-squat x5 each.  Provided patient with written exercise sheet.  Patient required extended rest break between each activity due to c/o "my heart feels like it's beating out of my chest."  HR rate elevated to 130 with LE therex, decreased to 123 with rest.  At this time patient declined all additional treatments and agreed only to participate in propelling WC back to room.  Patient propelled WC 80' with mod A back to room to end session.    Therapy Documentation Precautions:  Precautions Precautions: Fall Precaution Comments: high resting HR Restrictions Weight Bearing Restrictions: No   Pain: Pain Assessment Pain Assessment: No/denies pain  Locomotion : Ambulation Ambulation/Gait Assistance: 5:  Supervision   See FIM for current functional status  Therapy/Group: Individual Therapy  Rexene Agent 04/21/2012, 1:43 PM

## 2012-04-21 NOTE — Progress Notes (Signed)
Subjective/Complaints: Had a good day with therapies yesterday. Breathing and stamina improving.  A 12 point review of systems has been performed and if not noted above is otherwise negative.   Objective: Vital Signs: Blood pressure 148/81, pulse 93, temperature 98.2 F (36.8 C), temperature source Oral, resp. rate 20, SpO2 91.00%. No results found. No results found for this basename: WBC:2,HGB:2,HCT:2,PLT:2 in the last 72 hours No results found for this basename: NA:2,K:2,CL:2,CO:2,GLUCOSE:2,BUN:2,CREATININE:2,CALCIUM:2 in the last 72 hours CBG (last 3)  No results found for this basename: GLUCAP:3 in the last 72 hours  Wt Readings from Last 3 Encounters:  04/14/12 115.849 kg (255 lb 6.4 oz)    Physical Exam:  HENT:  Head: Normocephalic.  Eyes:  Pupils round and reactive to light  Neck: Normal range of motion. Neck supple. No thyromegaly present.  Cardiovascular: Normal rate and regular rhythm.  Pulmonary/Chest: Bilateral crackles Decreased breath sounds at the bases with limited inspiratory effort  Abdominal: Soft. Bowel sounds are normal. She exhibits no distension.  Obese  Neurological: She is alert.  Generally appropriate. Reasonable insight and awareness. Skin: Skin is warm and dry. No visible rash. Skin a little dry Motor strength is 4/5 in bilateral deltoid, biceps, triceps, grip, 3-4   hip flexors,Knee extensors, 4 minus ankle dorsiflexors and plantar flexors  Sensation is intact to light touch in both upper and lower limbs. DTR's are 1+   Assessment/Plan: 1. Functional deficits secondary to severe deconditioning after pneumonia, respiratory failure which require 3+ hours per day of interdisciplinary therapy in a comprehensive inpatient rehab setting. Physiatrist is providing close team supervision and 24 hour management of active medical problems listed below. Physiatrist and rehab team continue to assess barriers to discharge/monitor patient progress toward  functional and medical goals.     FIM: FIM - Bathing Bathing Steps Patient Completed: Chest;Right Arm;Left Arm;Abdomen;Front perineal area;Buttocks;Right upper leg;Left upper leg Bathing: 4: Min-Patient completes 8-9 58f 10 parts or 75+ percent  FIM - Upper Body Dressing/Undressing Upper body dressing/undressing steps patient completed: Thread/unthread right sleeve of pullover shirt/dresss;Thread/unthread left sleeve of pullover shirt/dress;Put head through opening of pull over shirt/dress;Pull shirt over trunk Upper body dressing/undressing: 5: Set-up assist to: Obtain clothing/put away FIM - Lower Body Dressing/Undressing Lower body dressing/undressing steps patient completed: Pull underwear up/down;Thread/unthread right pants leg;Thread/unthread left pants leg;Pull pants up/down;Don/Doff right sock;Don/Doff left sock;Don/Doff right shoe;Don/Doff left shoe;Fasten/unfasten right shoe;Fasten/unfasten left shoe Lower body dressing/undressing: 4: Min-Patient completed 75 plus % of tasks  FIM - Toileting Toileting: 0: Activity did not occur  FIM - Diplomatic Services operational officer Devices: Best boy Transfers: 5-To toilet/BSC: Supervision (verbal cues/safety issues);5-From toilet/BSC: Supervision (verbal cues/safety issues)  FIM - Banker Devices: Walker;Arm rests Bed/Chair Transfer: 5: Chair or W/C > Bed: Supervision (verbal cues/safety issues);5: Bed > Chair or W/C: Supervision (verbal cues/safety issues)  FIM - Locomotion: Wheelchair Distance: 150 Locomotion: Wheelchair: 1: Total Assistance/staff pushes wheelchair (Pt<25%) FIM - Locomotion: Ambulation Locomotion: Ambulation Assistive Devices: Designer, industrial/product Ambulation/Gait Assistance: 5: Supervision Locomotion: Ambulation: 1: Travels less than 50 ft with supervision/safety issues  Comprehension Comprehension Mode: Auditory Comprehension: 7-Follows complex  conversation/direction: With no assist  Expression Expression Mode: Verbal Expression: 6-Expresses complex ideas: With extra time/assistive device  Social Interaction Social Interaction: 6-Interacts appropriately with others with medication or extra time (anti-anxiety, antidepressant).  Problem Solving Problem Solving: 6-Solves complex problems: With extra time  Memory Memory: 5-Recognizes or recalls 90% of the time/requires cueing < 10% of the time  Medical  Problem List and Plan:  1. Deconditioning after respiratory failure with associated pneumonia  2. DVT Prophylaxis/Anticoagulation: Subcutaneous heparin-can dc given bruising and increased mobility 3. Mood/delirium. Xanax 0.25 mg every 6 hours as needed. Provide emotional support and positive reinforcement.  4. Neuropsych: This patient Is capable of making decisions on his/her own behalf.  5. Dysphagia. Advanced to regular diet 6. Obesity. Dietary followup  7. Anemia. Anemia workup consistent with iron deficiency anemia with no evidence of bleeding. Followup CBC  With improved hgb  -continue iron supplement   -activity tolerance improving 8. Pruritus: benadyrl, sarna 9. Insomnia: prn trazodone 10. Urine: u culture negative, timed voids 11. Pulmonary: off oxygen and doing well.  LOS (Days) 7 A FACE TO FACE EVALUATION WAS PERFORMED  SWARTZ,ZACHARY T 04/21/2012 8:04 AM

## 2012-04-21 NOTE — Progress Notes (Signed)
Occupational Therapy Session Note  Patient Details  Name: Megan Espinoza MRN: [id number] Date of Birth: Jul 09, 1955  Today's Date: 04/21/2012 Time: 0915-1010 Time Calculation (min): 55 min  Short Term Goals: Week 1:  OT Short Term Goal 1 (Week 1): Pt will transfer to toilet / BSC with min A  OT Short Term Goal 2 (Week 1): Pt will do LB dressing with mod A with extra time OT Short Term Goal 3 (Week 1): Pt will perform sit to stand for clothing management with min a OT Short Term Goal 4 (Week 1): Pt will perform toileting wtih mod A  Skilled Therapeutic Interventions/Progress Updates:    1:1 self care retraining at shower level in room with focus on activity tolerance, sit to stands, standing balance without Ue support for clothing management and pericare, functional ambulation with RW, safe use of RW, d/c planning with friend Boyd Kerbs present, problem solving functional mobility at home as well as in the community, discussion Rolator versus RW. Pt with increased HR in session after seated shower- HR 167 after going to sit on toilet to dress. Left note for MD  Therapy Documentation Precautions:  Precautions Precautions: Fall Precaution Comments: high resting HR Restrictions Weight Bearing Restrictions: No General:   Vital Signs:   Pain: No /co pain in session today See FIM for current functional status  Therapy/Group: Individual Therapy  Roney Mans [facility] 04/21/2012, 10:13 AM

## 2012-04-21 NOTE — Progress Notes (Signed)
Occupational Therapy Session Note  Patient Details  Name: QUANESHA KLIMASZEWSKI MRN: [id number] Date of Birth: 1955-04-22  Today's Date: 04/21/2012 Time: 1123-1200 Time Calculation (min): 37 min  Short Term Goals: Week 1:  OT Short Term Goal 1 (Week 1): Pt will transfer to toilet / BSC with min A  OT Short Term Goal 2 (Week 1): Pt will do LB dressing with mod A with extra time OT Short Term Goal 3 (Week 1): Pt will perform sit to stand for clothing management with min a OT Short Term Goal 4 (Week 1): Pt will perform toileting wtih mod A  Skilled Therapeutic Interventions/Progress Updates:    Pt seen for 1:1 OT with focus on education regarding DME for tub/shower transfer and appropriate technique for steps.  Pt reports extreme anxiety with ambulation and stairs, however wanting to attempt 1 step.  Use of curb step setup with education on RW placement and technique prior to engaging in activity.  Pt completed curb step up and down x2 with light min assist.  Pt required rest break between 2 attempts.  Completed tub/shower transfer with use of tub bench as pt reports walk-in shower too small and wanting to use tub/shower upon d/c  Pt reports having a tub bench at home, performed transfer with close supervision.  Pt required multiple rest breaks between activities.  HR monitored after each activity with HR = 134-138 after step and tub/shower transfers.  At rest HR would return to 95-110.  Therapy Documentation Precautions:  Precautions Precautions: Fall Precaution Comments: high resting HR Restrictions Weight Bearing Restrictions: No Pain: Pain Assessment Pain Assessment: 0-10 Pain Score:   5 Pain Type: Acute pain Pain Location: Knee Pain Orientation: Right Pain Descriptors: Aching Pain Onset: Gradual Patients Stated Pain Goal: 2 Pain Intervention(s): Medication (See eMAR) (tylenol 650 mg po)  See FIM for current functional status  Therapy/Group: Individual Therapy  Leonette Monarch 04/21/2012, 12:33 PM

## 2012-04-22 ENCOUNTER — Inpatient Hospital Stay (HOSPITAL_COMMUNITY): Payer: Self-pay | Admitting: Occupational Therapy

## 2012-04-22 ENCOUNTER — Inpatient Hospital Stay (HOSPITAL_COMMUNITY): Payer: MEDICAID | Admitting: Occupational Therapy

## 2012-04-22 ENCOUNTER — Inpatient Hospital Stay (HOSPITAL_COMMUNITY): Payer: MEDICAID | Admitting: Physical Therapy

## 2012-04-22 DIAGNOSIS — R5381 Other malaise: Secondary | ICD-10-CM

## 2012-04-22 MED ORDER — ALPRAZOLAM 0.25 MG PO TABS: 0.2500 mg | ORAL_TABLET | Freq: Four times a day (QID) | ORAL | Status: DC | PRN

## 2012-04-22 MED ORDER — ALBUTEROL SULFATE HFA 108 (90 BASE) MCG/ACT IN AERS
2.0000 | INHALATION_SPRAY | Freq: Four times a day (QID) | RESPIRATORY_TRACT | Status: DC | PRN
  Filled 2012-04-22: qty 6.7

## 2012-04-22 MED ORDER — BUDESONIDE 0.5 MG/2ML IN SUSP: 0.5000 mg | Freq: Two times a day (BID) | RESPIRATORY_TRACT | Status: DC

## 2012-04-22 MED ORDER — ALBUTEROL SULFATE HFA 108 (90 BASE) MCG/ACT IN AERS: 2.0000 | INHALATION_SPRAY | Freq: Four times a day (QID) | RESPIRATORY_TRACT | Status: DC | PRN

## 2012-04-22 MED ORDER — PREDNISONE 20 MG PO TABS: 20.0000 mg | ORAL_TABLET | Freq: Two times a day (BID) | ORAL | Status: DC

## 2012-04-22 MED ORDER — FERROUS SULFATE 325 (65 FE) MG PO TABS: 325.0000 mg | ORAL_TABLET | Freq: Two times a day (BID) | ORAL | Status: DC

## 2012-04-22 MED ORDER — ARFORMOTEROL TARTRATE 15 MCG/2ML IN NEBU: 15.0000 ug | INHALATION_SOLUTION | Freq: Two times a day (BID) | RESPIRATORY_TRACT | Status: DC

## 2012-04-22 MED ORDER — BENZONATATE 100 MG PO CAPS: 100.0000 mg | ORAL_CAPSULE | Freq: Three times a day (TID) | ORAL | Status: DC | PRN

## 2012-04-22 NOTE — Progress Notes (Signed)
Physical Therapy Discharge Summary  Patient Details  Name: Megan Espinoza MRN: [id number] Date of Birth: 1955/10/10  Today's Date: 04/22/2012  Patient has met 8 of 9 long term goals due to improved activity tolerance, improved balance and ability to compensate for deficits.  Patient to discharge at an ambulatory level Modified Independent.  Pt improved strength and activity tolerance to a mod I level with short distance gait and mobility.  Pt self limited due to anxiety, fatigue and therefore was not able to gait longer distances or participate fully in therapy during her stay.  Pt will benefit from continued PT to increase cardiorespiratory endurance, strength and activity tolerance.  Reasons goals not met: pt unable to gait 50' in home environment due to fatigue  Recommendation:  Patient will benefit from ongoing skilled PT services in home health setting to continue to advance safe functional mobility, address ongoing impairments in activity tolerance, strength, balance, and minimize fall risk.  Equipment: RW, W/c  Reasons for discharge: treatment goals met and discharge from hospital  Patient/family agrees with progress made and goals achieved: Yes  PT Discharge Cognition Overall Cognitive Status: Appears within functional limits for tasks assessed Sensation Sensation Light Touch: Appears Intact Proprioception: Appears Intact Coordination Gross Motor Movements are Fluid and Coordinated: Yes Motor  Motor Motor: Within Functional Limits Motor - Discharge Observations: continues with decreased activity tolerance, high HR with activity   Trunk/Postural Assessment  Cervical Assessment Cervical Assessment: Within Functional Limits Thoracic Assessment Thoracic Assessment: Within Functional Limits Lumbar Assessment Lumbar Assessment: Within Functional Limits Postural Control Postural Control: Within Functional Limits  Balance Static Sitting Balance Static Sitting - Level  of Assistance: 7: Independent Dynamic Sitting Balance Dynamic Sitting - Level of Assistance: 7: Independent Static Standing Balance Static Standing - Level of Assistance: 6: Modified independent (Device/Increase time) Dynamic Standing Balance Dynamic Standing - Level of Assistance: 6: Modified independent (Device/Increase time) Extremity Assessment      RLE Assessment RLE Assessment: Within Functional Limits LLE Assessment LLE Assessment: Within Functional Limits  See FIM for current functional status  [name] 04/22/2012, 4:26 PM

## 2012-04-22 NOTE — Progress Notes (Signed)
Subjective/Complaints: Continues to improve. Had good day yesteday  A 12 point review of systems has been performed and if not noted above is otherwise negative.   Objective: Vital Signs: Blood pressure 148/82, pulse 75, temperature 98.1 F (36.7 C), temperature source Oral, resp. rate 18, SpO2 97.00%. No results found. No results found for this basename: WBC:2,HGB:2,HCT:2,PLT:2 in the last 72 hours No results found for this basename: NA:2,K:2,CL:2,CO:2,GLUCOSE:2,BUN:2,CREATININE:2,CALCIUM:2 in the last 72 hours CBG (last 3)  No results found for this basename: GLUCAP:3 in the last 72 hours  Wt Readings from Last 3 Encounters:  04/14/12 115.849 kg (255 lb 6.4 oz)    Physical Exam:  HENT:  Head: Normocephalic.  Eyes:  Pupils round and reactive to light  Neck: Normal range of motion. Neck supple. No thyromegaly present.  Cardiovascular: Normal rate and regular rhythm.  Pulmonary/Chest: Bilateral crackles Decreased breath sounds at the bases with limited inspiratory effort  Abdominal: Soft. Bowel sounds are normal. She exhibits no distension.  Obese  Neurological: She is alert.  Generally appropriate. Reasonable insight and awareness. Skin: Skin is warm and dry. No visible rash. Skin a little dry Motor strength is 4/5 in bilateral deltoid, biceps, triceps, grip, 3-4   hip flexors,Knee extensors, 4 minus ankle dorsiflexors and plantar flexors  Sensation is intact to light touch in both upper and lower limbs. DTR's are 1+   Assessment/Plan: 1. Functional deficits secondary to severe deconditioning after pneumonia, respiratory failure which require 3+ hours per day of interdisciplinary therapy in a comprehensive inpatient rehab setting. Physiatrist is providing close team supervision and 24 hour management of active medical problems listed below. Physiatrist and rehab team continue to assess barriers to discharge/monitor patient progress toward functional and medical goals.  Home  tomorrow after md rounds    FIM: FIM - Bathing Bathing Steps Patient Completed: Chest;Right Arm;Left Arm;Abdomen;Front perineal area;Buttocks;Right upper leg;Left upper leg;Left lower leg (including foot);Right lower leg (including foot) Bathing: 6: More than reasonable amount of time  FIM - Upper Body Dressing/Undressing Upper body dressing/undressing steps patient completed: Thread/unthread right sleeve of pullover shirt/dresss;Thread/unthread left sleeve of pullover shirt/dress;Put head through opening of pull over shirt/dress;Pull shirt over trunk Upper body dressing/undressing: 5: Set-up assist to: Obtain clothing/put away FIM - Lower Body Dressing/Undressing Lower body dressing/undressing steps patient completed: Pull underwear up/down;Thread/unthread right pants leg;Thread/unthread left pants leg;Pull pants up/down;Don/Doff right sock;Don/Doff left sock;Don/Doff right shoe;Don/Doff left shoe;Fasten/unfasten right shoe;Fasten/unfasten left shoe Lower body dressing/undressing: 4: Min-Patient completed 75 plus % of tasks  FIM - Toileting Toileting steps completed by patient: Adjust clothing prior to toileting;Performs perineal hygiene;Adjust clothing after toileting Toileting: 5: Supervision: Safety issues/verbal cues  FIM - Diplomatic Services operational officer Devices: Walker;Grab bars Toilet Transfers: 5-To toilet/BSC: Supervision (verbal cues/safety issues)  FIM - Banker Devices: Environmental consultant;Arm rests Bed/Chair Transfer: 5: Chair or W/C > Bed: Supervision (verbal cues/safety issues);5: Bed > Chair or W/C: Supervision (verbal cues/safety issues);5: Sit > Supine: Supervision (verbal cues/safety issues);5: Supine > Sit: Supervision (verbal cues/safety issues)  FIM - Locomotion: Wheelchair Distance: 60 Locomotion: Wheelchair: 2: Travels 50 - 149 ft with supervision, cueing or coaxing FIM - Locomotion: Ambulation Locomotion: Ambulation  Assistive Devices: Designer, industrial/product Ambulation/Gait Assistance: 5: Supervision Locomotion: Ambulation: 1: Travels less than 50 ft with supervision/safety issues  Comprehension Comprehension Mode: Auditory;Visual Comprehension: 7-Follows complex conversation/direction: With no assist  Expression Expression Mode: Verbal Expression: 6-Expresses complex ideas: With extra time/assistive device  Social Interaction Social Interaction: 6-Interacts appropriately with others with  medication or extra time (anti-anxiety, antidepressant).  Problem Solving Problem Solving: 6-Solves complex problems: With extra time  Memory Memory: 6-More than reasonable amt of time  Medical Problem List and Plan:  1. Deconditioning after respiratory failure with associated pneumonia  2. DVT Prophylaxis/Anticoagulation: Subcutaneous heparin-can dc given bruising and increased mobility 3. Mood/delirium. Xanax 0.25 mg every 6 hours as needed. Provide emotional support and positive reinforcement.  4. Neuropsych: This patient Is capable of making decisions on his/her own behalf.  5. Dysphagia. Advanced to regular diet 6. Obesity. Dietary followup  7. Anemia. Anemia workup consistent with iron deficiency anemia with no evidence of bleeding. Followup CBC  With improved hgb  -continue iron supplement   -activity tolerance improving 8. Pruritus: benadyrl, sarna 9. Insomnia: prn trazodone 10. Urine: u culture negative, timed voids 11. Pulmonary: off oxygen and doing well. Dc nebs. Prn mdi  -wean prednisone  LOS (Days) 8 A FACE TO FACE EVALUATION WAS PERFORMED  SWARTZ,ZACHARY T 04/22/2012 7:22 AM

## 2012-04-22 NOTE — Discharge Summary (Signed)
  Discharge summary job [id number]

## 2012-04-22 NOTE — Progress Notes (Signed)
Occupational Therapy Session Note  Patient Details  Name: Megan Espinoza MRN: [id number] Date of Birth: 1955-06-08  Today's Date: 04/22/2012 Time: 1300-1325 Time Calculation (min): 25 min  Short Term Goals: Week 1:  OT Short Term Goal 1 (Week 1): Pt will transfer to toilet / BSC with min A  OT Short Term Goal 2 (Week 1): Pt will do LB dressing with mod A with extra time OT Short Term Goal 3 (Week 1): Pt will perform sit to stand for clothing management with min a OT Short Term Goal 4 (Week 1): Pt will perform toileting wtih mod A  Skilled Therapeutic Interventions/Progress Updates:  Patient found seated in w/c beside bed upon entering room. Therapist set-up personal rolling walker to fit patient. Patient then propelled w/c from room -> therapy gym with supervision to focus on overall strength and endurance. Patient required moderate encouragement to propel w/c. Patient with complaints of an upset stomach during therapy session. Therapist made sure to state goal of therapy session at beginning of session and patient seemed to respond well to goal statement and meeting the goal. At end of session patient left seated in w/c beside bed with call bell & phone within reach.   Precautions:  Precautions Precautions: Fall Precaution Comments: high resting HR, High anxiety, son died recently, very emotional Restrictions Weight Bearing Restrictions: No  See FIM for current functional status  Therapy/Group: Individual Therapy  Angeliyah Kirkey,Jaylise 04/22/2012, 1:27 PM

## 2012-04-22 NOTE — Progress Notes (Signed)
Occupational Therapy Session Notes  Patient Details  Name: Megan Espinoza MRN: [id number] Date of Birth: Aug 15, 1955  Today's Date: 04/22/2012 Time: 0805-0900 and 200-225 Time Calculation (min): 55 min and 25 min Missed 20 minutes due to fatigue  Short Term Goals: Week 1:  OT Short Term Goal 1 (Week 1): Pt will transfer to toilet / BSC with min A  OT Short Term Goal 2 (Week 1): Pt will do LB dressing with mod A with extra time OT Short Term Goal 3 (Week 1): Pt will perform sit to stand for clothing management with min a OT Short Term Goal 4 (Week 1): Pt will perform toileting wtih mod A  Skilled Therapeutic Interventions/Progress Updates:  1)  Patient up in w/c upon arrival.  Engaged in self care retraining to include toileting, shower and dress.  Focused session on activity tolerance, pursed lip breathing, walker safety, toilet and shower transfers.  Patient reluctant to "overdo it today like I did yesterday" because she states she had a hard time sleeping last night.  2)  Patient resting in w/c upon arrival.  Patient reports feeling too fatigued to participate in full session.  Engaged in toileting and bed transfer with focus on activity tolerance, safe transfers, walker safety and bed mobility.  Patient eager to try her new walker that was delivered earlier.  Patient reports feeling much more confident each day regarding her BADLs and functional mobility and states that she is ready to go home tomorrow.  "It's been a long road and I know I still have more to do".  Therapy Documentation Precautions:  Precautions Precautions: Fall Precaution Comments: high resting HR, High anxiety, son died recently, very emotional Restrictions Weight Bearing Restrictions: No Pain: Pain Assessment Pain Assessment: No/denies pain ADL: See FIM for current functional status  Therapy/Group: Individual Therapy  Navjot Pilgrim 04/22/2012, 12:12 PM

## 2012-04-22 NOTE — Discharge Summary (Signed)
Megan Espinoza, Megan Espinoza           ACCOUNT NO.:  [id number]  MEDICAL RECORD NO.:  [id number]  LOCATION:  4030                         FACILITY:  [facility]  PHYSICIAN:  Ranelle Oyster, M.D.DATE OF BIRTH:  1956/02/26  DATE OF ADMISSION:  04/14/2012 DATE OF DISCHARGE:  04/23/2012                              DISCHARGE SUMMARY   DISCHARGE DIAGNOSES: 1. Deconditioning after respiratory failure with associated pneumonia. 2. Subcutaneous heparin for deep vein thrombosis prophylaxis. 3. Anxiety. 4. Dysphagia. 5. Obesity. 6. Anemia.  HISTORY OF PRESENT ILLNESS:  This is a 57 year old right-handed female admitted March 24, 2012, with nonproductive cough, generalized myalgias with increasing shortness of breath and wheezing as well as a fever of 102 with admixed chills but no nausea.  Chest x-ray with bilateral airspace opacities suspicious for pneumonia left greater than right, placed on antibiotic therapy.  Noted hemoglobin upon admission of 7.7, ultimately transfused.  CT of the chest confirmed patchy infiltrates with associated air bronchiograms.  Anemia workup consistent with iron deficiency with low reticulocytes and no evidence of bleeding. On March 28, 2012, developed VDRF due to ARDS with followup per Critical Care Medicine, maintained on steroid protocol.  The patient was intubated until April 09, 2012.  Respiratory viral panel positive influenza and changed to broad-spectrum antibiotics.  Bouts of delirium, agitation slowly improved.  Suspect encephalopathy.  Diet, maintained on a dysphagia 3 honey thick liquid per Speech Therapy.  Subcutaneous heparin for DVT prophylaxis.  Physical and occupational therapy evaluations ongoing.  The patient was felt to be a good candidate for inpatient rehab services.  PAST MEDICAL HISTORY:  See discharge diagnoses.  SOCIAL HISTORY:  Lives with significant other.  One level home, 3 steps to entry.  FUNCTIONAL HISTORY PRIOR TO  ADMISSION:  Independent driving, works part- time for [employer] and [employer].  FUNCTIONAL STATUS UPON ADMISSION TO REHAB SERVICES:  +2 total assist stand pivot transfers.  PHYSICAL EXAMINATION:  VITAL SIGNS:  Blood pressure 163/80, pulse 89, temperature 98.9, respirations 20. GENERAL:  This was an alert female. HEENT:  Pupils round and reactive to light.  She was able to name person, date of birth and age.  She followed simple commands.  Her voice was a bit coarse but intelligible. LUNGS:  Decreased breath sounds.  Clear to auscultation. CARDIAC:  Rate controlled. ABDOMEN:  Soft, nontender.  Good bowel sounds.  REHABILITATION HOSPITAL COURSE:  The patient was admitted to Inpatient Rehab Services with therapies initiated on a 3-hour daily basis consisting of physical therapy, occupational therapy, speech therapy, and rehabilitation nursing.  The following issues were addressed during the patient's rehabilitation stay.  Pertaining to Ms. Finau deconditioning after respiratory failure with associated pneumonia all antibiotics had since been discontinued.  She remained afebrile. Steroid protocol with slow taper of prednisone.  Subcutaneous heparin for DVT prophylaxis.  She was using Xanax on limited basis for bouts of anxiety with good results.  Her diet was slowly advanced to a regular consistency which she tolerated well.  She was followed for iron- deficiency anemia, with a latest hemoglobin of 9.7 and monitored. Latest urine study showed no growth.  The patient received weekly collaborative interdisciplinary team conferences to discuss estimated length of stay, family  teaching, and any barriers to discharge.  She had occasional incontinence of bladder.  She was minimal assist overall for activities of daily living, minimal assist mobility.  She was able to communicate her needs without any difficulty.  She is to be discharged to home with her boyfriend and family who provide  intermittent assistance with full family teaching completed.  DISCHARGE MEDICATIONS: 1. Xanax 0.25 mg every 6 hours as needed. 2. Continue nebulizer treatments as advised. 3. Ferrous sulfate 325 mg twice daily. 4. Prednisone 20 mg b.i.d. with taper as directed. 5. Protonix 40 mg b.i.d.  DIET:  Regular.  She should follow up with Dr. Sand[facility] 2 weeks, call for appointment, Dr. Harold Hedge at the Outpatient Rehab Service office as needed.  Ongoing therapies were dictated as per [employer].     Mariam Dollar, P.A.   ______________________________ Ranelle Oyster, M.D.    DA/MEDQ  D:  04/22/2012  T:  04/22/2012  Job:  [id number]  cc:   Charlaine Dalton. Sherene Sires, MD, FCCP

## 2012-04-22 NOTE — Progress Notes (Signed)
Physical Therapy Session Note  Patient Details  Name: Megan Espinoza MRN: [id number] Date of Birth: 1955-10-07  Today's Date: 04/22/2012 Time: 1015-1030 Time Calculation (min): 15 min  Skilled Therapeutic Interventions/Progress Updates:  Patient initially declined participation in session but after brief discussion of importance of working toward goals agreed to participate in ambulation.  Sit/stand transfer from [facility] to RW with supervision.  Patient ambulated in controlled environment 70' with RW and close supervision.  Stand/sit transfer back into WC with VCs for pursed lip breathing to manage c/o elevated heart rate.  Patient's heart rate 144 at end of ambulation, decreased to 129 within 1 minute of seated rest.  Patient declined to participate in further therapy at this time and requested to be returned to her room.     Therapy Documentation Precautions:  Precautions Precautions: Fall Precaution Comments: high resting HR, High anxiety, son died recently, very emotional Restrictions Weight Bearing Restrictions: No  Pain: Pain Assessment Pain Assessment: No/denies pain  See FIM for current functional status  Therapy/Group: Individual Therapy  Rexene Agent 04/22/2012, 10:41 AM

## 2012-04-22 NOTE — Progress Notes (Signed)
Physical Therapy Note  Patient Details  Name: Megan Espinoza MRN: [id number] Date of Birth: Apr 14, 1956 Today's Date: 04/22/2012  Time: 900-926 26 minutes  No c/o pain.  Pt c/o fatigue after OT session.  Agreeable to participate with encouragement.  Discussed energy conservation strategies and recommendations for continuing to exercise and walk at home, pt verbalizes understanding.  Stair training x 3 stairs supervision with B handrails. HR 130bpm after stairs, quickly goes down to 117 with seated rest.  Gait training 30' with mod I with RW.  Pt self limiting due to anxiety.  Individual therapy   Maykel Reitter 04/22/2012, 9:27 AM

## 2012-04-22 NOTE — Progress Notes (Signed)
Social Work  Discharge Note  The overall goal for the admission was met for:   Discharge location: Yes - home with family to assist as needed  Length of Stay: Yes - 9 days  Discharge activity level: Yes - modified independent  Home/community participation: Yes  Services provided included: MD, RD, PT, OT, SLP, RN, TR, Pharmacy, Neuropsych and SW  Financial Services: Other: self pay  Follow-up services arranged: Home Health: RN  (PT and OT follow up were recommended, however, Medicaid pending will not cover these services.  Pt aware), DME: 20x18 Breezy w/c , basic cushion and wide rolling walker via [facility] and Patient/Family has no preference for HH/DME agencies  Comments (or additional information): HHRN to be provided by [facility]  Also discussed with patient about signing up for Obamacare - she had started process prior to hospitalization and plans to complete once home.   Patient/Family verbalized understanding of follow-up arrangements: Yes  Individual responsible for coordination of the follow-up plan: patient  Confirmed correct DME delivered: Megan Espinoza 04/22/2012    Megan Espinoza

## 2012-04-23 NOTE — Progress Notes (Signed)
Pt d/c'd home with husband at 15. All belongings with pt, all questions answered to pts satisfaction. VSS, no complaints. Mick Sell, RN

## 2012-04-23 NOTE — Progress Notes (Signed)
Patient ID: Megan Espinoza, female   DOB: April 12, 1956, 57 y.o.   MRN: [id number] Subjective/Complaints: 1/4.  Continues to improve. Had good day yesteday.  Excited about discharge today. Now able to walk 50 feet with a walker. Discharge planning complete. Examination- alert no distress. ENT negative. Chest clear. Cardiac-exam revealed a resting tachycardia at a rate of about 100. Rhythm regular. Abdomen obese soft and nontender. Extremities no edema  CBG (last 3)  No results found for this basename: GLUCAP:3 in the last 72 hours   BP Readings from Last 3 Encounters:  04/23/12 148/83  04/14/12 119/76  03/24/12 135/79    Impression. Status post influenza A with acute respiratory failure/ARDS and severe deconditioning. Stable for discharge  Plan.  Discharge today as planned   Objective: Vital Signs: Blood pressure 148/83, pulse 94, temperature 98 F (36.7 C), temperature source Oral, resp. rate 19, SpO2 92.00%. No results found. No results found for this basename: WBC:2,HGB:2,HCT:2,PLT:2 in the last 72 hours No results found for this basename: NA:2,K:2,CL:2,CO:2,GLUCOSE:2,BUN:2,CREATININE:2,CALCIUM:2 in the last 72 hours CBG (last 3)  No results found for this basename: GLUCAP:3 in the last 72 hours  Wt Readings from Last 3 Encounters:  04/14/12 115.849 kg (255 lb 6.4 oz)    Physical Exam:  HENT:  Head: Normocephalic.  Eyes:  Pupils round and reactive to light  Neck: Normal range of motion. Neck supple. No thyromegaly present.  Cardiovascular: Normal rate and regular rhythm.  Pulmonary/Chest: Bilateral crackles Decreased breath sounds at the bases with limited inspiratory effort  Abdominal: Soft. Bowel sounds are normal. She exhibits no distension.  Obese  Neurological: She is alert.  Generally appropriate. Reasonable insight and awareness. Skin: Skin is warm and dry. No visible rash. Skin a little dry Motor strength is 4/5 in bilateral deltoid, biceps, triceps, grip,  3-4   hip flexors,Knee extensors, 4 minus ankle dorsiflexors and plantar flexors  Sensation is intact to light touch in both upper and lower limbs. DTR's are 1+   Assessment/Plan: 1. Functional deficits secondary to severe deconditioning after pneumonia, respiratory failure which require 3+ hours per day of interdisciplinary therapy in a comprehensive inpatient rehab setting. Physiatrist is providing close team supervision and 24 hour management of active medical problems listed below. Physiatrist and rehab team continue to assess barriers to discharge/monitor patient progress toward functional and medical goals.  Home tomorrow after md rounds    FIM: FIM - Bathing Bathing Steps Patient Completed: Chest;Right Arm;Left Arm;Abdomen;Front perineal area;Buttocks;Right upper leg;Left upper leg;Left lower leg (including foot);Right lower leg (including foot) Bathing: 6: More than reasonable amount of time  FIM - Upper Body Dressing/Undressing Upper body dressing/undressing steps patient completed: Thread/unthread right sleeve of pullover shirt/dresss;Thread/unthread left sleeve of pullover shirt/dress;Put head through opening of pull over shirt/dress;Pull shirt over trunk Upper body dressing/undressing: 7: Complete Independence: No helper FIM - Lower Body Dressing/Undressing Lower body dressing/undressing steps patient completed: Thread/unthread right pants leg;Thread/unthread left pants leg;Pull pants up/down;Fasten/unfasten pants;Don/Doff right sock;Don/Doff left sock;Don/Doff right shoe;Don/Doff left shoe;Fasten/unfasten right shoe;Fasten/unfasten left shoe Lower body dressing/undressing: 5: Supervision: Safety issues/verbal cues  FIM - Toileting Toileting steps completed by patient: Adjust clothing prior to toileting;Performs perineal hygiene;Adjust clothing after toileting Toileting Assistive Devices: Grab bar or rail for support Toileting: 5: Supervision: Safety issues/verbal cues  FIM -  Diplomatic Services operational officer Devices: Elevated toilet seat;Grab bars;Walker Toilet Transfers: 5-To toilet/BSC: Supervision (verbal cues/safety issues);5-From toilet/BSC: Supervision (verbal cues/safety issues)  FIM - Banker Devices: Walker;Arm rests Bed/Chair  Transfer: 6: Bed > Chair or W/C: No assist;6: Chair or W/C > Bed: No assist  FIM - Locomotion: Wheelchair Distance: 100 Locomotion: Wheelchair: 2: Travels 50 - 149 ft with supervision, cueing or coaxing FIM - Locomotion: Ambulation Locomotion: Ambulation Assistive Devices: Designer, industrial/product Ambulation/Gait Assistance: 5: Supervision Locomotion: Ambulation: 2: Travels 50 - 149 ft with supervision/safety issues  Comprehension Comprehension Mode: Auditory Comprehension: 7-Follows complex conversation/direction: With no assist  Expression Expression Mode: Verbal Expression: 6-Expresses complex ideas: With extra time/assistive device  Social Interaction Social Interaction: 6-Interacts appropriately with others with medication or extra time (anti-anxiety, antidepressant).  Problem Solving Problem Solving: 7-Solves complex problems: Recognizes & self-corrects  Memory Memory: 7-Complete Independence: No helper  Medical Problem List and Plan:  1. Deconditioning after respiratory failure with associated pneumonia  2. DVT Prophylaxis/Anticoagulation: Subcutaneous heparin-can dc given bruising and increased mobility 3. Mood/delirium. Xanax 0.25 mg every 6 hours as needed. Provide emotional support and positive reinforcement.  4. Neuropsych: This patient Is capable of making decisions on his/her own behalf.  5. Dysphagia. Advanced to regular diet 6. Obesity. Dietary followup  7. Anemia. Anemia workup consistent with iron deficiency anemia with no evidence of bleeding. Followup CBC  With improved hgb  -continue iron supplement   -activity tolerance improving 8. Pruritus:  benadyrl, sarna 9. Insomnia: prn trazodone 10. Urine: u culture negative, timed voids 11. Pulmonary: off oxygen and doing well. Dc nebs. Prn mdi  -wean prednisone  LOS (Days) 9 A FACE TO FACE EVALUATION WAS PERFORMED  Rogelia Boga 04/23/2012 8:08 AM

## 2012-04-24 LAB — FUNGUS CULTURE W SMEAR

## 2012-04-25 ENCOUNTER — Inpatient Hospital Stay (HOSPITAL_COMMUNITY): Payer: Self-pay | Admitting: Speech Pathology

## 2012-04-25 ENCOUNTER — Telehealth: Payer: Self-pay

## 2012-04-25 NOTE — Telephone Encounter (Signed)
[facility] pharmacy called regarding prednisone directions for patient.  [phone]

## 2012-04-25 NOTE — Telephone Encounter (Signed)
Unable to locate source of question about prednisone.  [facility] says they do not have that patient in their system, [facility] does not have the order.  Will wait for a call back.

## 2012-05-11 LAB — AFB CULTURE WITH SMEAR (NOT AT ARMC)

## 2012-12-05 ENCOUNTER — Telehealth: Payer: Self-pay | Admitting: Internal Medicine

## 2012-12-05 NOTE — Telephone Encounter (Signed)
If someone cancels earlier she can take that appointment, otherwise she will have to keep her appointment in OCT.

## 2012-12-05 NOTE — Telephone Encounter (Signed)
The patient is hoping to be worked in for a new patient apt sooner than your next available (oct).  She is a self-pay pt.  Do you want her worked in?

## 2013-10-25 IMAGING — CR DG CHEST 1V PORT
1 series · 1 of 1 positions shown · non-contrast
Comparison: 03/29/2012.

CLINICAL DATA: Respiratory failure.

PORTABLE CHEST - 1 VIEW

[AP]
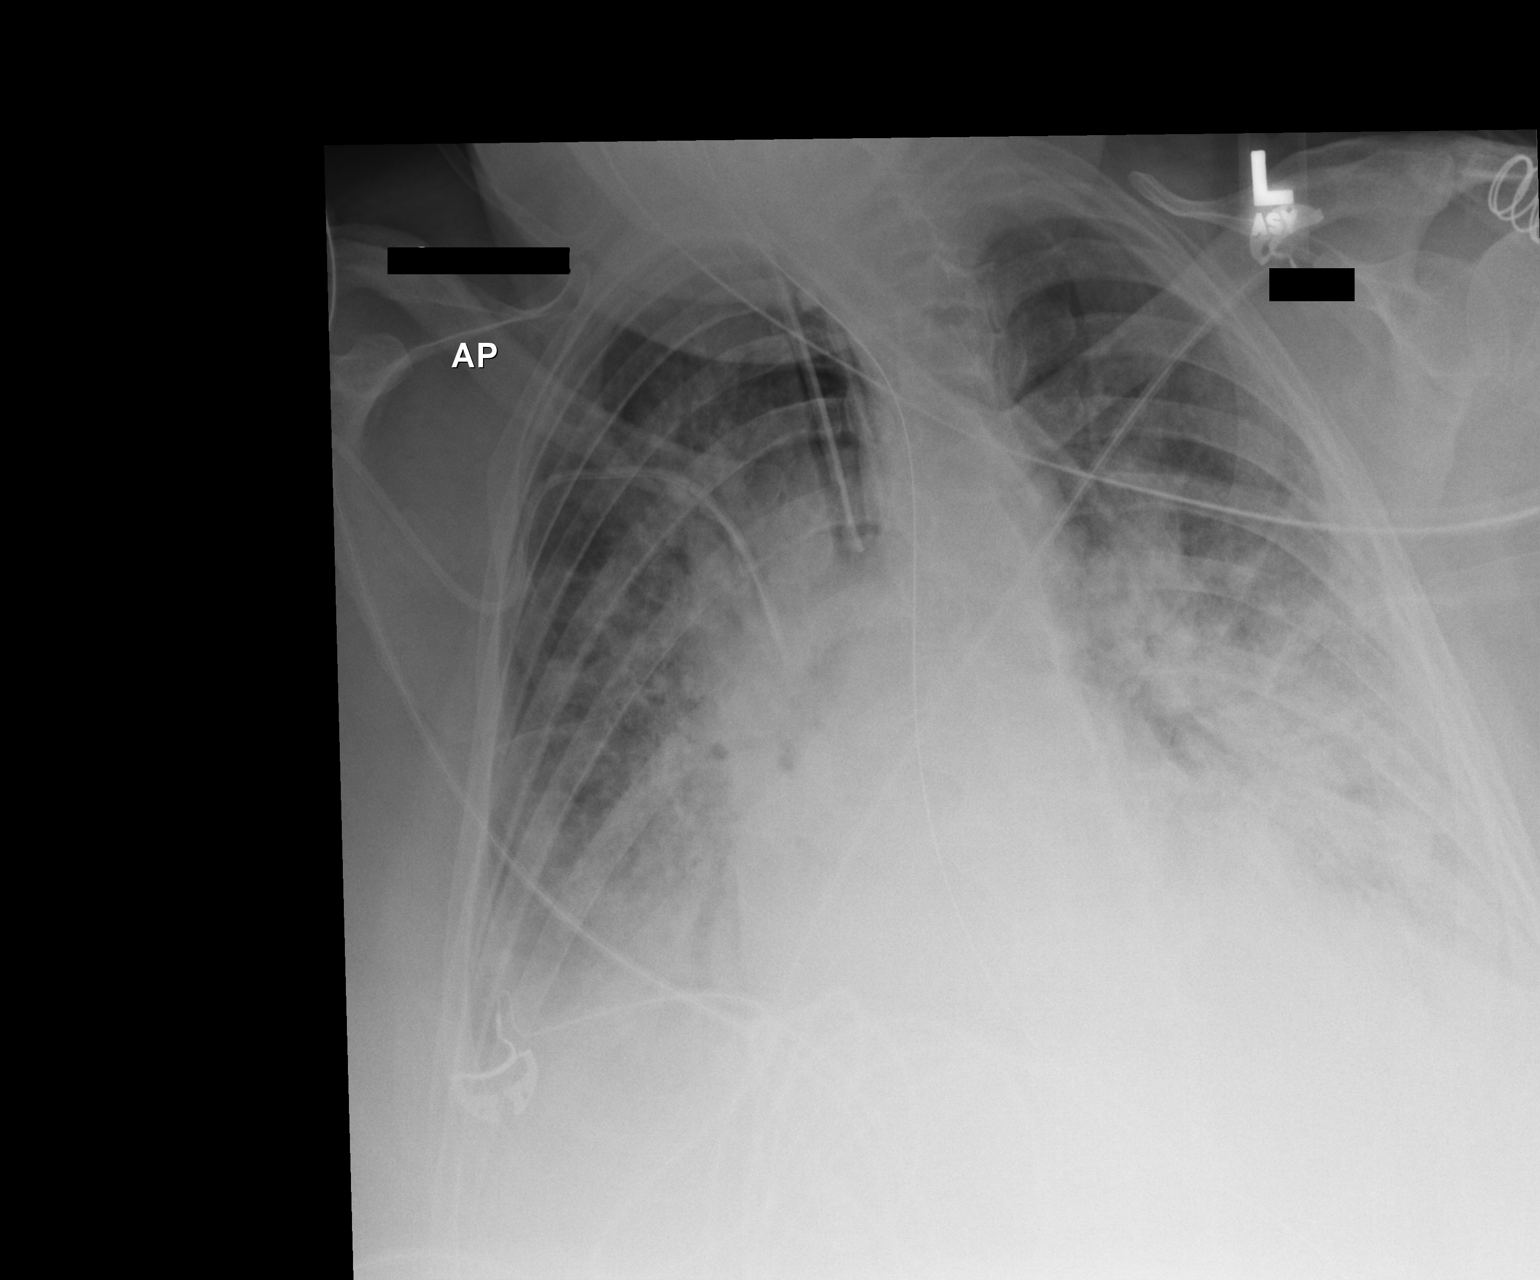

[1 of 1 positions shown; findings below may reference images not displayed]

FINDINGS: Support devices are stable.  Diffuse bilateral airspace
disease again noted, unchanged.  Heart is borderline in size.
IMPRESSION: No significant change diffuse bilateral airspace disease.

## 2013-10-28 IMAGING — CR DG CHEST 1V PORT
1 series · 1 of 1 positions shown · non-contrast
Comparison: 04/01/2012

CLINICAL DATA: Respiratory failure.  Pulmonary infiltrates.

PORTABLE CHEST - 1 VIEW

[AP]
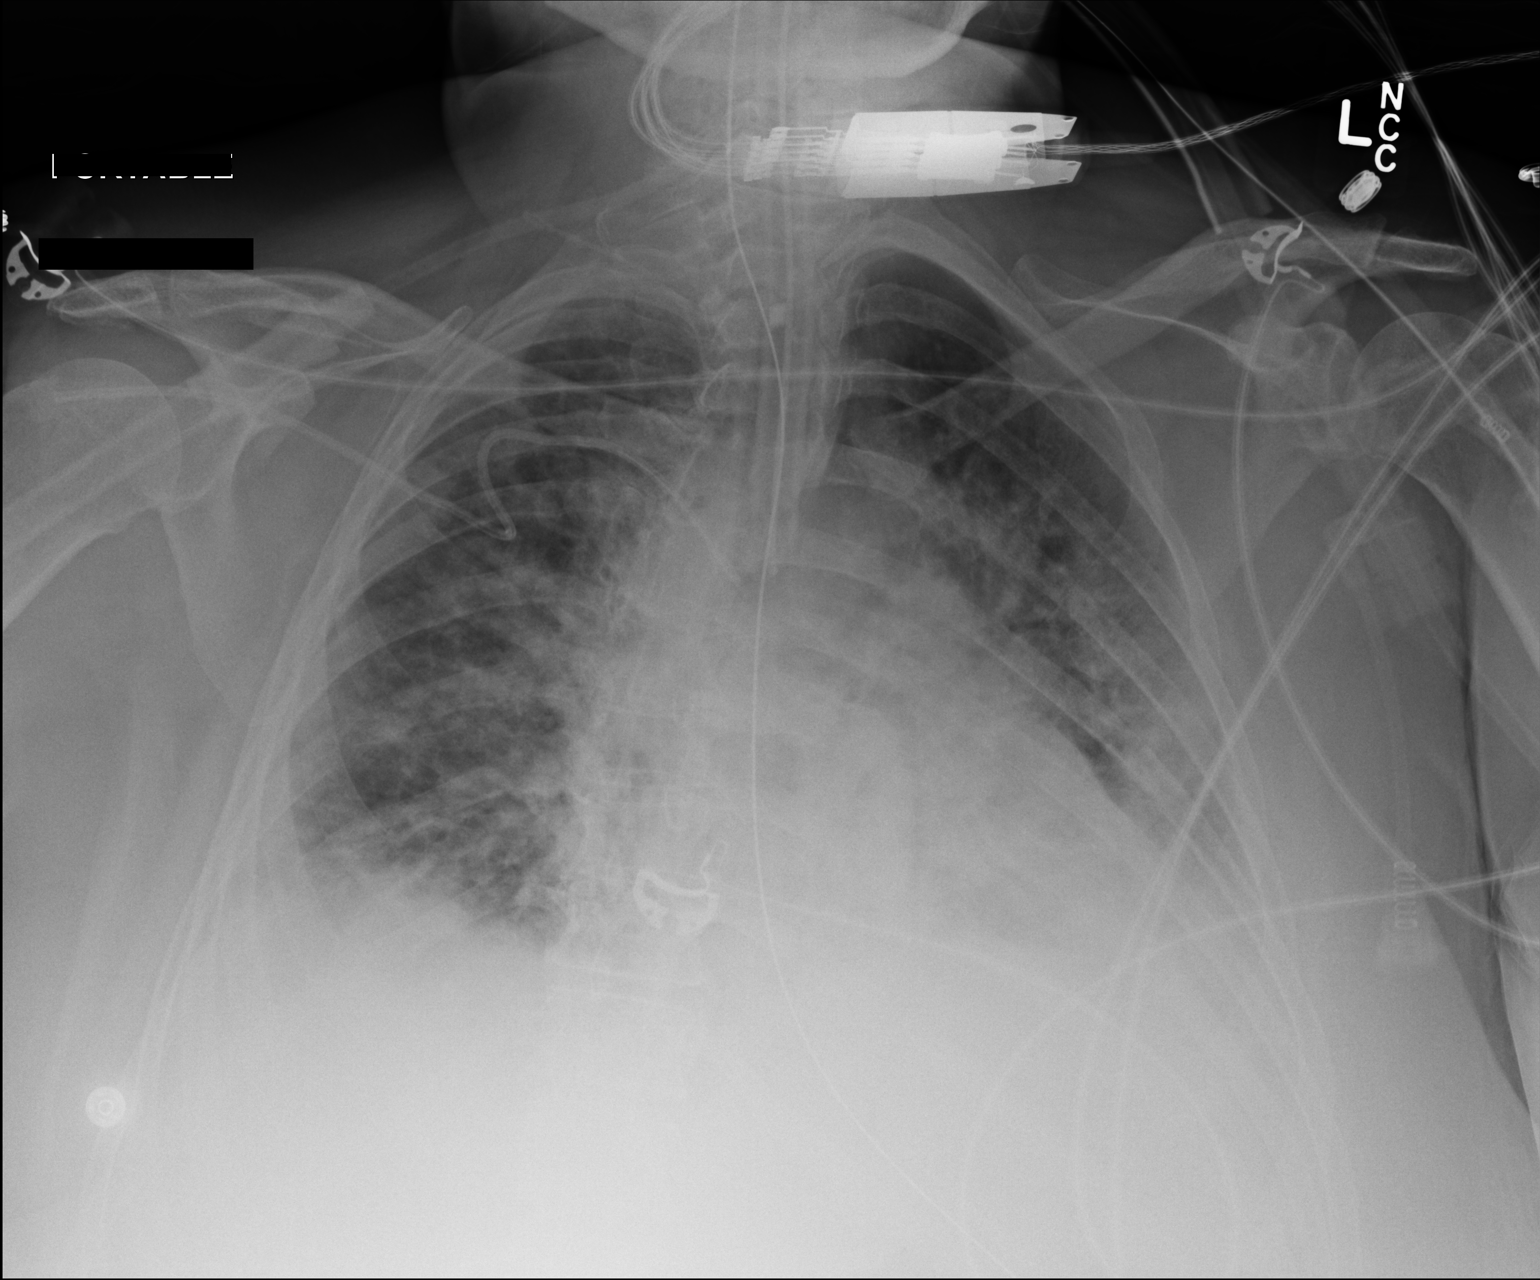

[1 of 1 positions shown; findings below may reference images not displayed]

FINDINGS: Endotracheal tube tip is 2 cm above the carina.  Central
venous catheter tip is in the superior vena cava at the level of
the azygos vein.  NG tube tip is below the diaphragm.

Pulmonary vascularity has improved since the prior study.  The
patchy bilateral infiltrates are slightly improved.
IMPRESSION: Decreased pulmonary vascular prominence.  Slight improvement in the
patchy bilateral infiltrates.

## 2013-10-29 IMAGING — CR DG CHEST 1V PORT
1 series · 1 of 1 positions shown · non-contrast
Comparison: Chest radiograph 03/23/2012

CLINICAL DATA: Follow-up respiratory failure

PORTABLE CHEST - 1 VIEW

[AP]
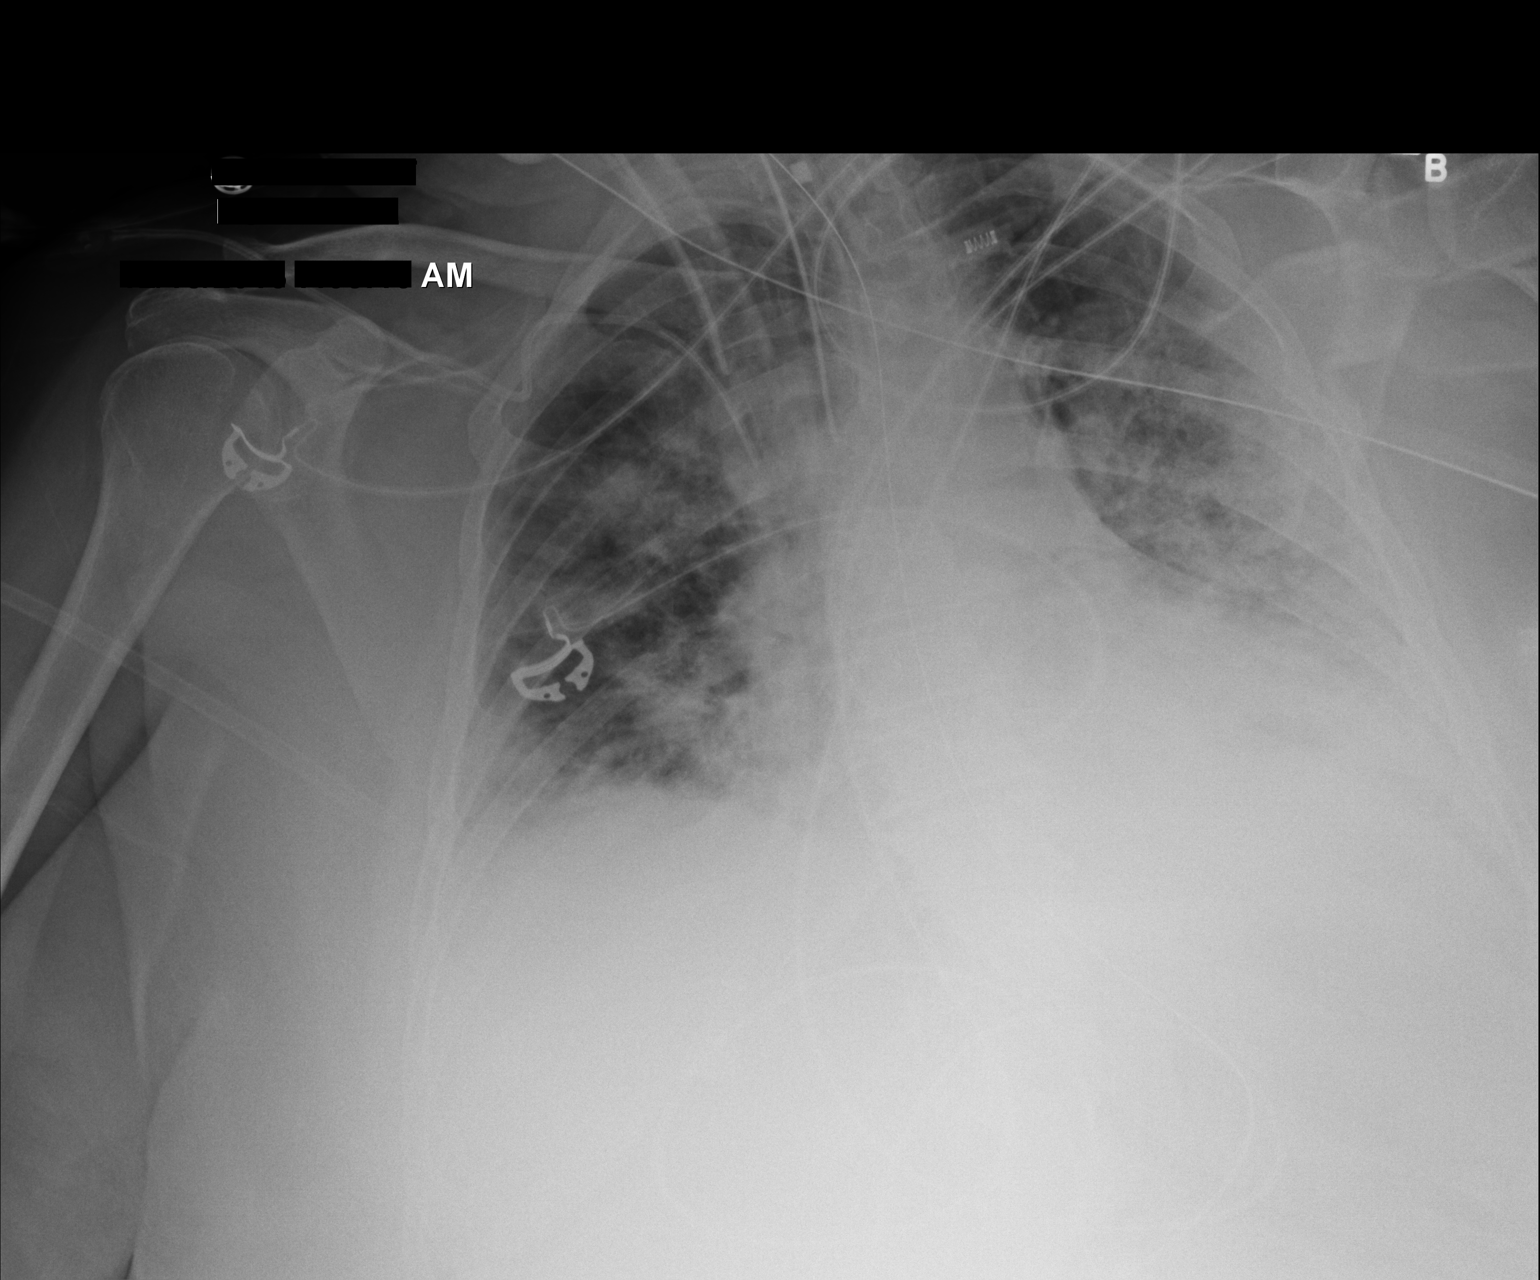

[1 of 1 positions shown; findings below may reference images not displayed]

FINDINGS: Endotracheal tube, NG tube, and right central venous line
are unchanged.  There is interval decreased lung volumes.  There is
diffuse patchy bilateral air space disease which is not improved
compared to prior.  No pneumothorax.
IMPRESSION: 1.  Stable support apparatus.

2.  Decrease in lung volumes.

3.  No change in diffuse patchy bilateral air space disease.]

## 2013-10-30 IMAGING — CR DG CHEST 1V PORT
1 series · 1 of 1 positions shown · non-contrast
Comparison: Chest x-ray 04/03/2012.

CLINICAL DATA: Follow-up evaluation of the ARDS.

PORTABLE CHEST - 1 VIEW

[AP]
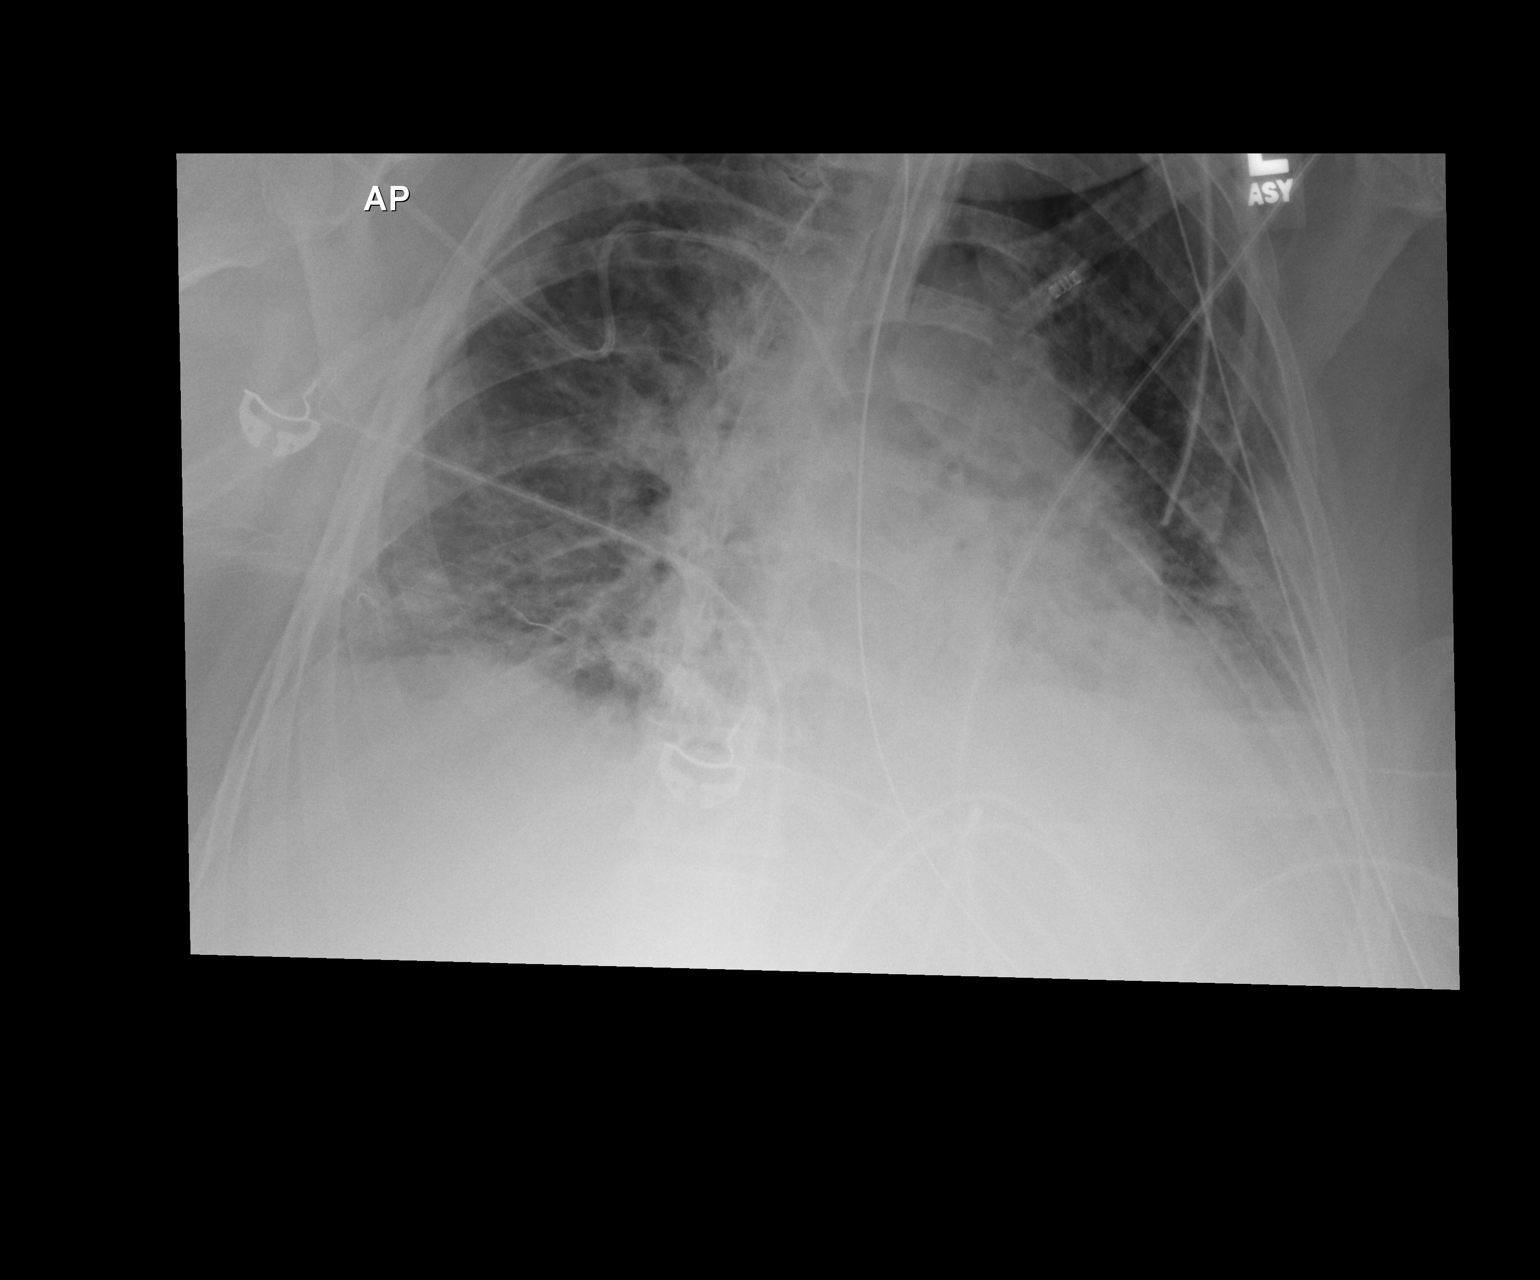

[1 of 1 positions shown; findings below may reference images not displayed]

FINDINGS: An endotracheal tube is in place with tip 3.1 cm above
the carina. A nasogastric tube is seen extending into the stomach,
however, the tip of the nasogastric tube extends below the lower
margin of the image. There is a right-sided subclavian central
venous catheter with tip terminating in the proximal superior vena
cava. Lung volumes are low.  Bibasilar opacities may reflect areas
of atelectasis and/or consolidation. Patchy multifocal interstitial
and airspace disease throughout the remainder of the lungs.  Small
bilateral pleural effusions.  Crowding of pulmonary vasculature,
accentuated by low lung volumes, without frank pulmonary edema.
Heart size is upper limits of normal. The patient is rotated to the
left on today's exam, resulting in distortion of the mediastinal
contours and reduced diagnostic sensitivity and specificity for
mediastinal pathology.
IMPRESSION: 1.  Patchy multifocal interstitial and airspace disease,
particularly in the lung bases bilaterally, concerning for
multilobar pneumonia.  Likely superimposed bibasilar atelectasis.
2.  Small bilateral pleural effusions

## 2013-11-02 IMAGING — CR DG CHEST 1V PORT
1 series · 1 of 1 positions shown · non-contrast
Comparison: 04/06/2012

CLINICAL DATA: Evaluate endotracheal tube position

PORTABLE CHEST - 1 VIEW

[AP]
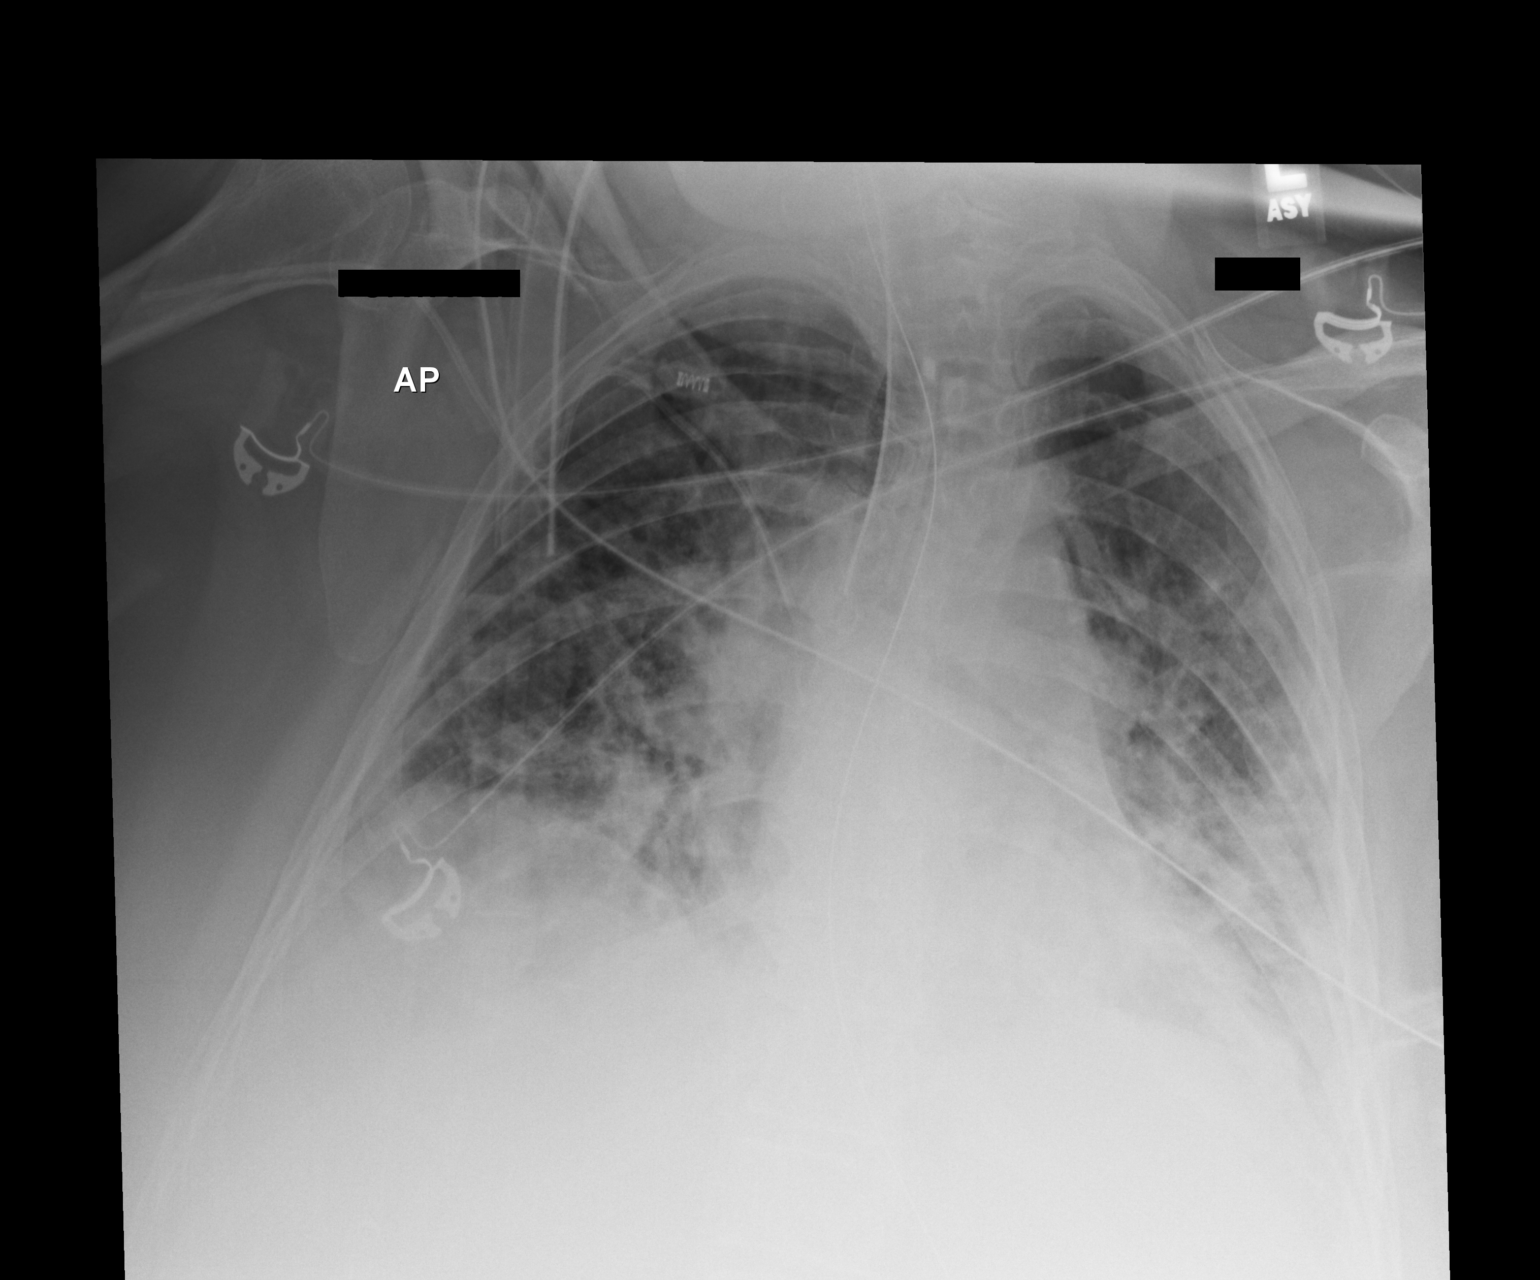

[1 of 1 positions shown; findings below may reference images not displayed]

FINDINGS: The endotracheal tube has been further inserted.  The tip
lies 1 cm above the carina, well positioned.

Patchy bilateral airspace opacities are unchanged from the previous
day's study.  No pneumothorax.

The right subclavian central venous line and the nasogastric tube
are both stable well-positioned.
IMPRESSION: Endotracheal tube now has its tip 1 cm above the carina.  Stable
patchy bilateral airspace lung opacities.

## 2015-10-03 ENCOUNTER — Ambulatory Visit (HOSPITAL_COMMUNITY)
Admission: EM | Admit: 2015-10-03 | Discharge: 2015-10-03 | Disposition: A | Discharge status: Home / Self Care | Payer: Self-pay | Attending: Emergency Medicine | Admitting: Emergency Medicine

## 2015-10-03 ENCOUNTER — Encounter (HOSPITAL_COMMUNITY): Payer: Self-pay | Admitting: Emergency Medicine

## 2015-10-03 ENCOUNTER — Ambulatory Visit (HOSPITAL_COMMUNITY): Payer: Self-pay

## 2015-10-03 DIAGNOSIS — R911 Solitary pulmonary nodule: Secondary | ICD-10-CM

## 2015-10-03 DIAGNOSIS — R059 Cough, unspecified: Secondary | ICD-10-CM

## 2015-10-03 DIAGNOSIS — R05 Cough: Secondary | ICD-10-CM | POA: Insufficient documentation

## 2015-10-03 MED ORDER — ALBUTEROL SULFATE HFA 108 (90 BASE) MCG/ACT IN AERS
2.0000 | INHALATION_SPRAY | Freq: Once | RESPIRATORY_TRACT | Status: AC
  Administered 2015-10-03: 2 via RESPIRATORY_TRACT

## 2015-10-03 MED ORDER — ALBUTEROL SULFATE HFA 108 (90 BASE) MCG/ACT IN AERS
INHALATION_SPRAY | RESPIRATORY_TRACT | Status: AC
  Filled 2015-10-03: qty 6.7

## 2015-10-03 NOTE — ED Provider Notes (Addendum)
CSN: [id number]     Arrival date & time 10/03/15  1406 History   First MD Initiated Contact with Patient 10/03/15 1419     Chief Complaint  Patient presents with  . Cough   (Consider location/radiation/quality/duration/timing/severity/associated sxs/prior Treatment) HPI She is a 60 year old woman here for evaluation of cough. The cough started about 2 days ago. It is dry and hacking cough. She denies any shortness of breath or wheezing. No nasal congestion or rhinorrhea. No postnasal drainage. No fevers or chills. She states she otherwise feels well, but the cough is quite of noxious. She has tried Robitussin without relief. She does have a history of flu pneumonia 3 or 4 years ago. She was admitted to the hospital in the ICU and was on a ventilator for quite some time. She was told she may have some chronic lung problems from being on the vent so long.  Her blood pressure is quite elevated. Repeat remains elevated at 165/115. She states her blood pressure always goes up when she sees the doctor.  Past Medical History  Diagnosis Date  . Obesity (BMI 30.0-34.9)   . Influenza, pneumonia December 2013    Associated with ARDS, Septic shock   Past Surgical History  Procedure Laterality Date  . Cholecystectomy    . Hernia repair     Family History  Problem Relation Age of Onset  . Hypertension     Social History  Substance Use Topics  . Smoking status: Never Smoker   . Smokeless tobacco: None  . Alcohol Use: Yes     Comment: occasional   OB History    No data available     Review of Systems As in history of present illness Allergies  Penicillins; Codeine; Erythromycin; and Sulfa antibiotics  Home Medications   Prior to Admission medications   Medication Sig Start Date End Date Taking? Authorizing Provider  acetaminophen (TYLENOL) 325 MG tablet Take 2 tablets (650 mg total) by mouth every 6 (six) hours as needed (or Fever >/= 101). 04/14/12  Yes Simonne MartinetPeter E Babcock, NP   Meds  Ordered and Administered this Visit   Medications  albuterol (PROVENTIL HFA;VENTOLIN HFA) 108 (90 Base) MCG/ACT inhaler 2 puff (2 puffs Inhalation Given 10/03/15 1550)    BP 201/114 mmHg  Pulse 99  Temp(Src) 98.4 F (36.9 C) (Oral)  Resp 16  SpO2 95% No data found. Repeat BP 164/115  Physical Exam  Constitutional: She is oriented to person, place, and time. She appears well-developed and well-nourished. No distress.  Neck: Neck supple.  Cardiovascular: Normal rate, regular rhythm and normal heart sounds.   No murmur heard. Pulmonary/Chest: Effort normal and breath sounds normal. No respiratory distress. She has no wheezes. She has no rales.  Neurological: She is alert and oriented to person, place, and time.    ED Course  Procedures (including critical care time)  Labs Review Labs Reviewed - No data to display  Imaging Review Dg Chest 2 View  10/03/2015  CLINICAL DATA:  Cough for 3 days.  Chronic shortness of breath. EXAM: CHEST  2 VIEW COMPARISON:  04/11/2012 FINDINGS: Cardiac silhouette is normal in size. There is a moderate size hiatal hernia. No mediastinal or hilar masses or convincing adenopathy. There are prominent bronchovascular markings. The small subtle nodular opacities noted in the right upper lobe above the level of the medial right clavicle, not evident on the prior exam. There is no evidence of pneumonia or convincing pulmonary edema. No pleural effusion or pneumothorax. Bony  thorax is demineralized but intact. IMPRESSION: 1. No acute cardiopulmonary disease. 2. Possible right upper lobe nodule. Recommend follow-up chest CT on a nonemergent basis for further assessment. Electronically Signed   By: Amie Portland M.D.   On: 10/03/2015 15:19      MDM   1. Cough   2. Lung nodule    Cough is likely secondary to chronic lung disease following her prolonged intubation. We'll treat with albuterol as needed. Patient states she is aware of the right upper lobe lung  nodule.  I have recommended follow-up x-ray in 3-6 months for reevaluation. A CT scan is going to be difficult to obtain given her lack of primary care and insurance. Follow-up as needed.    Charm Rings, MD 10/03/15 1546  Charm Rings, MD 10/03/15 [phone]

## 2015-10-03 NOTE — ED Notes (Signed)
PT reports a nonproductive, barky cough for 2 days. PT reports she does not feel very bad, but 3 years ago she had a similar cough that was pneumonia and she was in critical care for 30 days. PT is concerned and wants to be checked out before she leaves town tomorrow.

## 2015-10-03 NOTE — Discharge Instructions (Signed)
I suspect your cough is coming from some asthma following the prolonged intubation. Use the albuterol inhaler every 4 hours as needed for cough or wheezing. You do not need to take any of the Robitussin. Your x-ray did show a nodule in your right upper lung. This should be re-x-rayed in 3-6 months to make sure it is not getting bigger. Follow-up as needed.

## 2019-07-16 ENCOUNTER — Other Ambulatory Visit: Payer: Self-pay

## 2019-07-16 ENCOUNTER — Encounter (HOSPITAL_COMMUNITY): Payer: Self-pay

## 2019-07-16 ENCOUNTER — Ambulatory Visit (HOSPITAL_COMMUNITY)
Admission: EM | Admit: 2019-07-16 | Discharge: 2019-07-16 | Disposition: A | Discharge status: Home / Self Care | Payer: HRSA Program | Attending: Family Medicine | Admitting: Family Medicine

## 2019-07-16 DIAGNOSIS — Z79899 Other long term (current) drug therapy: Secondary | ICD-10-CM | POA: Diagnosis not present

## 2019-07-16 DIAGNOSIS — R5383 Other fatigue: Secondary | ICD-10-CM | POA: Insufficient documentation

## 2019-07-16 DIAGNOSIS — E669 Obesity, unspecified: Secondary | ICD-10-CM | POA: Insufficient documentation

## 2019-07-16 DIAGNOSIS — Z6830 Body mass index (BMI) 30.0-30.9, adult: Secondary | ICD-10-CM | POA: Diagnosis not present

## 2019-07-16 DIAGNOSIS — J0110 Acute frontal sinusitis, unspecified: Secondary | ICD-10-CM

## 2019-07-16 DIAGNOSIS — D509 Iron deficiency anemia, unspecified: Secondary | ICD-10-CM | POA: Diagnosis not present

## 2019-07-16 DIAGNOSIS — U071 COVID-19: Secondary | ICD-10-CM | POA: Insufficient documentation

## 2019-07-16 MED ORDER — FLUTICASONE PROPIONATE 50 MCG/ACT NA SUSP: 2.0000 | Freq: Every day | NASAL | 2 refills | Status: DC

## 2019-07-16 MED ORDER — DOXYCYCLINE HYCLATE 100 MG PO CAPS: 100.0000 mg | ORAL_CAPSULE | Freq: Two times a day (BID) | ORAL | 0 refills | Status: DC

## 2019-07-16 NOTE — ED Triage Notes (Signed)
Pt state she took the Covid vaccine 2 weeks. Pt states she has sinus pressure right across her forehead.

## 2019-07-16 NOTE — ED Provider Notes (Signed)
[facility]    CSN: [id number] Arrival date & time: 07/16/19  1102      History   Chief Complaint Chief Complaint  Patient presents with  . Facial Pain    HPI Megan Espinoza is a 64 y.o. female.   HPI  Patient with a history of allergy Previously had a history of sinus problems Currently has pressure and pain across her forehead and behind her eyes.  Is been present for about 2 weeks.  Her nose feels congestion.  No postnasal drip or sore throat.  Today has a fever.  Feels very tired. She states that she had her first Covid vaccine about 2 weeks ago.  Her second 1 is due next week. He has not had any known exposure to Covid. She states that she has been sick for about 2 weeks and feels like she is getting worse.  The fatigue is profound.  She drove home from work on Friday and fell asleep in the car before she could even get into the house.  Past Medical History:  Diagnosis Date  . Influenza, pneumonia December 2013   Associated with ARDS, Septic shock  . Obesity (BMI 30.0-34.9)     Patient Active Problem List   Diagnosis Date Noted  . Physical deconditioning 04/17/2012  . Influenza A 03/31/2012  . Acute renal insufficiency 03/30/2012  . ARDS (adult respiratory distress syndrome) (HCC) 03/28/2012  . Steroid-induced hyperglycemia 03/28/2012  . Acute respiratory failure with hypoxia (HCC) 03/27/2012  . Microcytic anemia 03/24/2012  . Obesity (BMI 30.0-34.9) 03/24/2012    Past Surgical History:  Procedure Laterality Date  . CHOLECYSTECTOMY    . HERNIA REPAIR      OB History   No obstetric history on file.      Home Medications    Prior to Admission medications   Medication Sig Start Date End Date Taking? Authorizing Provider  acetaminophen (TYLENOL) 325 MG tablet Take 2 tablets (650 mg total) by mouth every 6 (six) hours as needed (or Fever >/= 101). 04/14/12   Simonne Martinet, NP  doxycycline (VIBRAMYCIN) 100 MG capsule Take 1 capsule (100  mg total) by mouth 2 (two) times daily. 07/16/19   Eustace Moore, MD  fluticasone [facility]) 50 MCG/ACT nasal spray Place 2 sprays into both nostrils daily. 07/16/19   Eustace Moore, MD    Family History Family History  Problem Relation Age of Onset  . Hypertension Other     Social History Social History   Tobacco Use  . Smoking status: Never Smoker  . Smokeless tobacco: Never Used  Substance Use Topics  . Alcohol use: Yes    Comment: occasional  . Drug use: No     Allergies   Penicillins, Codeine, Erythromycin, and Sulfa antibiotics   Review of Systems Review of Systems  Constitutional: Positive for appetite change and fatigue. Negative for chills and fever.  HENT: Positive for congestion, sinus pressure and sinus pain. Negative for sore throat.   Respiratory: Positive for shortness of breath. Negative for cough.        Chronic  Gastrointestinal: Negative for nausea and vomiting.  Musculoskeletal: Negative for myalgias.  Neurological: Positive for headaches.     Physical Exam Triage Vital Signs ED Triage Vitals  Enc Vitals Group     BP 07/16/19 1124 (!) 156/88     Pulse Rate 07/16/19 1124 (!) 101     Resp 07/16/19 1124 20     Temp 07/16/19 1124 (!) 100.5  F (38.1 C)     Temp Source 07/16/19 1124 Oral     SpO2 07/16/19 1124 96 %     Weight 07/16/19 1123 (!) 315 lb (142.9 kg)     Height --      Head Circumference --      Peak Flow --      Pain Score 07/16/19 1123 8     Pain Loc --      Pain Edu? --      Excl. in [place]? --    No data found.  Updated Vital Signs BP (!) 156/88 (BP Location: Right Arm)   Pulse (!) 101   Temp (!) 100.5 F (38.1 C) (Oral)   Resp 20   Wt (!) 142.9 kg   SpO2 96%   BMI 52.42 kg/m     Physical Exam Constitutional:      General: She is not in acute distress.    Appearance: She is well-developed. She is obese. She is ill-appearing.     Comments: Appears tired  HENT:     Head: Normocephalic and atraumatic.      Right Ear: Tympanic membrane and ear canal normal.     Left Ear: Tympanic membrane and ear canal normal.     Nose: Congestion present.     Comments: Frontal and ethmoid sinuses are very tender.  Maxillary not    Mouth/Throat:     Pharynx: Posterior oropharyngeal erythema present.  Eyes:     Conjunctiva/sclera: Conjunctivae normal.     Pupils: Pupils are equal, round, and reactive to light.  Cardiovascular:     Rate and Rhythm: Normal rate and regular rhythm.     Heart sounds: Normal heart sounds.  Pulmonary:     Effort: Pulmonary effort is normal. No respiratory distress.     Comments: Few central rhonchi Musculoskeletal:        General: Normal range of motion.     Cervical back: Normal range of motion.  Lymphadenopathy:     Cervical: No cervical adenopathy.  Skin:    General: Skin is warm and dry.  Neurological:     Mental Status: She is alert.  Psychiatric:        Mood and Affect: Mood normal.        Behavior: Behavior normal.      UC Treatments / Results  Labs (all labs ordered are listed, but only abnormal results are displayed) Labs Reviewed  SARS CORONAVIRUS 2 (TAT 6-24 HRS)    EKG   Radiology No results found.  Procedures Procedures (including critical care time)  Medications Ordered in UC Medications - No data to display  Initial Impression / Assessment and Plan / UC Course  I have reviewed the triage vital signs and the nursing notes.  Pertinent labs & imaging results that were available during my care of the patient were reviewed by me and considered in my medical decision making (see chart for details).     I feel like patient likely has an acute sinusitis.  Already treated with antibiotics, fluids, mucolytic's, Flonase. We have done a coronavirus test and this result will be available tomorrow She is told to delay her second coronavirus test for week, until she is no longer febrile and feels well Call PCP if not improving by next week Final  Clinical Impressions(s) / UC Diagnoses   Final diagnoses:  Acute non-recurrent frontal sinusitis     Discharge Instructions     Drink plenty of fluids Take Mucinex to  loosen the mucus Use Flonase daily Take the antibiotic 2 times a day until gone Get plenty of rest Expect improvement in a couple of days Your Covid test will be available in MyChart tomorrow   ED Prescriptions    Medication Sig Dispense Auth. Provider   fluticasone (FLONASE) 50 MCG/ACT nasal spray Place 2 sprays into both nostrils daily. 16 g Eustace Moore, MD   doxycycline (VIBRAMYCIN) 100 MG capsule Take 1 capsule (100 mg total) by mouth 2 (two) times daily. 14 capsule Eustace Moore, MD     PDMP not reviewed this encounter.   Eustace Moore, MD 07/16/19 [phone]

## 2019-07-16 NOTE — Discharge Instructions (Addendum)
Drink plenty of fluids Take Mucinex to loosen the mucus Use Flonase daily Take the antibiotic 2 times a day until gone Get plenty of rest Expect improvement in a couple of days Your Covid test will be available in MyChart tomorrow

## 2019-07-17 ENCOUNTER — Telehealth (HOSPITAL_COMMUNITY): Payer: Self-pay | Admitting: *Deleted

## 2019-07-17 LAB — SARS CORONAVIRUS 2 (TAT 6-24 HRS): SARS Coronavirus 2: POSITIVE — AB

## 2019-07-17 NOTE — Telephone Encounter (Signed)
Positive results discussed with patient. Patient is unable to pinpoint symptom onset as she started to feel bad 2 weeks ago after she received first dose of the vaccine. Given that patient states she has had headache for 3-4 days that she can not shake with tylenol and fever during UC visit yesterday will ask patient to quarantine till April 5th. Questions answered.

## 2019-07-19 ENCOUNTER — Ambulatory Visit (INDEPENDENT_AMBULATORY_CARE_PROVIDER_SITE_OTHER)
Admission: RE | Admit: 2019-07-19 | Discharge: 2019-07-19 | Disposition: A | Discharge status: Home / Self Care | Payer: HRSA Program | Source: Ambulatory Visit

## 2019-07-19 ENCOUNTER — Telehealth: Payer: Self-pay | Admitting: Physician Assistant

## 2019-07-19 DIAGNOSIS — R059 Cough, unspecified: Secondary | ICD-10-CM

## 2019-07-19 DIAGNOSIS — U071 COVID-19: Secondary | ICD-10-CM | POA: Diagnosis not present

## 2019-07-19 DIAGNOSIS — R05 Cough: Secondary | ICD-10-CM

## 2019-07-19 MED ORDER — ALBUTEROL SULFATE HFA 108 (90 BASE) MCG/ACT IN AERS: 2.0000 | INHALATION_SPRAY | RESPIRATORY_TRACT | 0 refills | Status: DC | PRN

## 2019-07-19 MED ORDER — BENZONATATE 100 MG PO CAPS: 100.0000 mg | ORAL_CAPSULE | Freq: Three times a day (TID) | ORAL | 0 refills | Status: DC

## 2019-07-19 NOTE — Discharge Instructions (Addendum)
Use the albuterol inhaler and take the [facility] as directed.   Since your COVID test is positive, you should self-quarantine for:  *10 days since your symptoms first appeared and  *24 hours with no fever, without the use of fever-reducing medications and  *your other symptoms of COVID are improving.  Most people do not need to be re-tested at the end of the quarantine period.    Go to the emergency department if you have high fever not relieved by Tylenol, shortness of breath, severe diarrhea, or other concerning symptoms.

## 2019-07-19 NOTE — ED Provider Notes (Signed)
Virtual Visit via Video Note:  Megan Espinoza  initiated request for Telemedicine visit with [facility] team. I connected with Megan Espinoza  on 07/19/2019 at 10:26 AM  for a synchronized telemedicine visit using a video enabled HIPPA compliant telemedicine application. I verified that I am speaking with Megan Espinoza  using two identifiers. Mickie Bail, NP  was physically located in a [facility] Urgent care site and AINSLEY DEAKINS was located at a different location.   The limitations of evaluation and management by telemedicine as well as the availability of in-person appointments were discussed. Patient was informed that she  may incur a bill ( including co-pay) for this virtual visit encounter. Megan Espinoza  expressed understanding and gave verbal consent to proceed with virtual visit.     History of Present Illness:Megan Espinoza  is a 64 y.o. female presents for evaluation of nonproductive cough x 1 day.  She was seen in the UC on 07/16/2019; treated with doxycycline, Mucinex, and Flonase for sinusitis; PCR COVID test came back positive.  Patient states her other symptoms have improved; she only has the cough now.  No fever since yesterday.  She denies chills, shortness of breath, vomiting, diarrhea, rash, or other symptoms.     Allergies  Allergen Reactions  . Penicillins Swelling  . Codeine Nausea And Vomiting  . Erythromycin     Mouth thrush- per pt  . Sulfa Antibiotics     Mouth thrush- per pt     Past Medical History:  Diagnosis Date  . Influenza, pneumonia December 2013   Associated with ARDS, Septic shock  . Obesity (BMI 30.0-34.9)      Social History   Tobacco Use  . Smoking status: Never Smoker  . Smokeless tobacco: Never Used  Substance Use Topics  . Alcohol use: Yes    Comment: occasional  . Drug use: No   ROS: as stated in HPI.  All other systems reviewed and negative.      Observations/Objective: Physical  Exam  VITALS: Patient denies fever. GENERAL: Alert, appears well and in no acute distress. HEENT: Atraumatic. NECK: Normal movements of the head and neck. CARDIOPULMONARY: No increased WOB. Speaking in clear sentences. I:E ratio WNL.  MS: Moves all visible extremities without noticeable abnormality. PSYCH: Pleasant and cooperative, well-groomed. Speech normal rate and rhythm. Affect is appropriate. Insight and judgement are appropriate. Attention is focused, linear, and appropriate.  NEURO: CN grossly intact. Oriented as arrived to appointment on time with no prompting. Moves both UE equally.  SKIN: No obvious lesions, wounds, erythema, or cyanosis noted on face or hands.   Assessment and Plan:    ICD-10-CM   1. COVID-19  U07.1   2. Cough  R05        Follow Up Instructions: Treating with albuterol inhaler and Tessalon Perles.  Instructed patient to continue taking doxycyline, Mucinex, and Flonase.  Referral made to [facility] for consideration if she meets the criteria for infusion.  Instructed patient to go to the emergency department if she has high fever not relieved by Tylenol, shortness of breath, severe diarrhea, or other concerning symptoms.  Patient agrees to plan of care.      I discussed the assessment and treatment plan with the patient. The patient was provided an opportunity to ask questions and all were answered. The patient agreed with the plan and demonstrated an understanding of the instructions.   The patient was advised  to call back or seek an in-person evaluation if the symptoms worsen or if the condition fails to improve as anticipated.      Sharion Balloon, NP  07/19/2019 10:26 AM         Sharion Balloon, NP 07/19/19 1026

## 2019-07-19 NOTE — Telephone Encounter (Signed)
Called to discuss with [name] about Covid symptoms and the use of bamlanivimab or casirivimab/imdevimab, a monoclonal antibody infusion for those with mild to moderate Covid symptoms and at a high risk of hospitalization.    Pt does not qualify for infusion therapy as pt's symptoms first presented > 10 days prior to timing of infusion. She had the vaccine about 2.5 weeks ago and haven't felt good since. She states that sx definitely started over 10 days. Thankfully, she is feeling much better now. Symptoms tier reviewed as well as criteria for ending isolation. Preventative practices reviewed. Patient verbalized understanding.     Patient Active Problem List   Diagnosis Date Noted  . Physical deconditioning 04/17/2012  . Influenza A 03/31/2012  . Acute renal insufficiency 03/30/2012  . ARDS (adult respiratory distress syndrome) (HCC) 03/28/2012  . Steroid-induced hyperglycemia 03/28/2012  . Acute respiratory failure with hypoxia (HCC) 03/27/2012  . Microcytic anemia 03/24/2012  . Obesity (BMI 30.0-34.9) 03/24/2012    Cline Crock PA-C

## 2019-08-18 ENCOUNTER — Telehealth: Payer: Self-pay | Admitting: Physician Assistant

## 2019-08-18 DIAGNOSIS — J029 Acute pharyngitis, unspecified: Secondary | ICD-10-CM

## 2019-08-18 DIAGNOSIS — R0981 Nasal congestion: Secondary | ICD-10-CM

## 2019-08-18 MED ORDER — FLUTICASONE PROPIONATE 50 MCG/ACT NA SUSP: 2.0000 | Freq: Every day | NASAL | 0 refills | Status: DC

## 2019-08-18 MED ORDER — MUCINEX 600 MG PO TB12: 600.0000 mg | ORAL_TABLET | Freq: Two times a day (BID) | ORAL | 0 refills | Status: DC | PRN

## 2019-08-18 MED ORDER — PHENOL 1.4 % MT LIQD: 1.0000 | OROMUCOSAL | 0 refills | Status: DC | PRN

## 2019-08-18 NOTE — Progress Notes (Signed)
We are sorry you are not feeling well.  Here is how we plan to help!  Based on what you have shared with me, it looks like you may have a viral upper respiratory infection.  Upper respiratory infections are caused by a large number of viruses; however, rhinovirus is the most common cause.   Symptoms vary from person to person, with common symptoms including sore throat, cough, fatigue or lack of energy and feeling of general discomfort.  A low-grade fever of up to 100.4 may present, but is often uncommon.  Symptoms vary however, and are closely related to a person's age or underlying illnesses.  The most common symptoms associated with an upper respiratory infection are nasal discharge or congestion, cough, sneezing, headache and pressure in the ears and face.  These symptoms usually persist for about 3 to 10 days, but can last up to 2 weeks.  It is important to know that upper respiratory infections do not cause serious illness or complications in most cases.    Upper respiratory infections can be transmitted from person to person, with the most common method of transmission being a person's hands.  The virus is able to live on the skin and can infect other persons for up to 2 hours after direct contact.  Also, these can be transmitted when someone coughs or sneezes; thus, it is important to cover the mouth to reduce this risk.  To keep the spread of the illness at bay, good hand hygiene is very important.  This is an infection that is most likely caused by a virus. There are no specific treatments other than to help you with the symptoms until the infection runs its course.  We are sorry you are not feeling well.  Here is how we plan to help!   For nasal congestion, you may use an oral decongestants such as Mucinex D or if you have glaucoma or high blood pressure use plain Mucinex.  Saline nasal spray or nasal drops can help and can safely be used as often as needed for congestion.  For your congestion,  I have prescribed Fluticasone nasal spray one spray in each nostril twice a day  If you do not have a history of heart disease, hypertension, diabetes or thyroid disease, prostate/bladder issues or glaucoma, you may also use Sudafed to treat nasal congestion.  It is highly recommended that you consult with a pharmacist or your primary care physician to ensure this medication is safe for you to take.     If you have a cough, you may use cough suppressants such as Delsym and Robitussin.  If you have glaucoma or high blood pressure, you can also use Coricidin HBP.    If you have a sore or scratchy throat, use a saltwater gargle-  to  teaspoon of salt dissolved in a 4-ounce to 8-ounce glass of warm water.  Gargle the solution for approximately 15-30 seconds and then spit.  It is important not to swallow the solution.  You can also use throat lozenges/cough drops to help with throat pain or discomfort.  Warm or cold liquids can also be helpful in relieving throat pain. For your sore throat I have prescribed phenol throat spray 1-2 sprays as needed for sore throat.   For headache, pain or general discomfort, you can use Ibuprofen or Tylenol as directed.   Some authorities believe that zinc sprays or the use of Echinacea may shorten the course of your symptoms.   HOME CARE .  Only take medications as instructed by your medical team. . Be sure to drink plenty of fluids. Water is fine as well as fruit juices, sodas and electrolyte beverages. You may want to stay away from caffeine or alcohol. If you are nauseated, try taking small sips of liquids. How do you know if you are getting enough fluid? Your urine should be a pale yellow or almost colorless. . Get rest. . Taking a steamy shower or using a humidifier may help nasal congestion and ease sore throat pain. You can place a towel over your head and breathe in the steam from hot water coming from a faucet. . Using a saline nasal spray works much the same  way. . Cough drops, hard candies and sore throat lozenges may ease your cough. . Avoid close contacts especially the very young and the elderly . Cover your mouth if you cough or sneeze . Always remember to wash your hands.   GET HELP RIGHT AWAY IF: . You develop worsening fever. . If your symptoms do not improve within 10 days . You develop yellow or green discharge from your nose over 3 days. . You have coughing fits . You develop a severe head ache or visual changes. . You develop shortness of breath, difficulty breathing or start having chest pain . Your symptoms persist after you have completed your treatment plan  MAKE SURE YOU   Understand these instructions.  Will watch your condition.  Will get help right away if you are not doing well or get worse.  Your e-visit answers were reviewed by a board certified advanced clinical practitioner to complete your personal care plan. Depending upon the condition, your plan could have included both over the counter or prescription medications. Please review your pharmacy choice. If there is a problem, you may call our nursing hot line at and have the prescription routed to another pharmacy. Your safety is important to [place]. If you have drug allergies check your prescription carefully.   You can use MyChart to ask questions about today's visit, request a non-urgent call back, or ask for a work or school excuse for 24 hours related to this e-Visit. If it has been greater than 24 hours you will need to follow up with your provider, or enter a new e-Visit to address those concerns. You will get an e-mail in the next two days asking about your experience.  I hope that your e-visit has been valuable and will speed your recovery. Thank you for using e-visits.  Greater than 5 minutes, yet less than 10 minutes of time have been spent researching, coordinating, and implementing care for this patient today.

## 2021-08-08 ENCOUNTER — Ambulatory Visit
Admission: EM | Admit: 2021-08-08 | Discharge: 2021-08-08 | Disposition: A | Discharge status: Home / Self Care | Payer: Medicare Other

## 2021-08-08 ENCOUNTER — Encounter: Payer: Self-pay | Admitting: Emergency Medicine

## 2021-08-08 DIAGNOSIS — J020 Streptococcal pharyngitis: Secondary | ICD-10-CM | POA: Diagnosis not present

## 2021-08-08 LAB — POCT RAPID STREP A (OFFICE): Rapid Strep A Screen: POSITIVE — AB

## 2021-08-08 MED ORDER — CLINDAMYCIN HCL 300 MG PO CAPS: 300.0000 mg | ORAL_CAPSULE | Freq: Three times a day (TID) | ORAL | 0 refills | Status: DC

## 2021-08-08 NOTE — ED Triage Notes (Signed)
Patient c/o possible strep throat, sore throat for a couple of days.  Patient's granddaughter daycare has had kids with strep and she's concerned. ?

## 2021-08-08 NOTE — Discharge Instructions (Signed)
You tested positive for strep pharyngitis.  You are contagious for 24 hours after starting medication.  Please take clindamycin 3 times a day.  Make sure to dispose of her toothbrush a few days after starting medication to prevent reinfection.  Gargle with warm salt water and use Tylenol or ibuprofen for pain relief.  If you have any worsening symptoms including difficulty speaking, difficulty swallowing, swelling of your throat, muffled voice, fever, nausea, vomiting you should go to the emergency room.  If symptoms or not improving please return for reevaluation. ?

## 2021-08-08 NOTE — ED Provider Notes (Signed)
?[place] ? ? ? ?CSN: [id number] ?Arrival date & time: 08/08/21  1355 ? ? ?  ? ?History   ?Chief Complaint ?Chief Complaint  ?Patient presents with  ? Sore Throat  ? ? ?HPI ?JALLIYAH PERT is a 66 y.o. female.  ? ?Patient presents today with a 3 to 4-day history of URI symptoms.  She reports some nasal congestion and sore throat.  Denies any significant cough, fever, chest pain, shortness of breath, nausea, vomiting, diarrhea.  She denies any known sick contacts but does report that her 55-year-old granddaughter lives with her and attends daycare so often brings her illnesses from daycare.  She has tried Chloraseptic throat lozenges without improvement of symptoms.  She is eating and drinking despite symptoms.  She denies any significant history of allergies.  She denies any recent antibiotic or prednisone use.  ? ? ?Past Medical History:  ?Diagnosis Date  ? Influenza, pneumonia December 2013  ? Associated with ARDS, Septic shock  ? Obesity (BMI 30.0-34.9)   ? ? ?Patient Active Problem List  ? Diagnosis Date Noted  ? Physical deconditioning 04/17/2012  ? Influenza A 03/31/2012  ? Acute renal insufficiency 03/30/2012  ? ARDS (adult respiratory distress syndrome) ([place]) 03/28/2012  ? Steroid-induced hyperglycemia 03/28/2012  ? Acute respiratory failure with hypoxia ([place]) 03/27/2012  ? Microcytic anemia 03/24/2012  ? Obesity (BMI 30.0-34.9) 03/24/2012  ? ? ?Past Surgical History:  ?Procedure Laterality Date  ? CHOLECYSTECTOMY    ? HERNIA REPAIR    ? ? ?OB History   ?No obstetric history on file. ?  ? ? ? ?Home Medications   ? ?Prior to Admission medications   ?Medication Sig Start Date End Date Taking? Authorizing Provider  ?acetaminophen (TYLENOL) 325 MG tablet Take 2 tablets (650 mg total) by mouth every 6 (six) hours as needed (or Fever >/= 101). 04/14/12  Yes Erick Colace, NP  ?albuterol (VENTOLIN HFA) 108 (90 Base) MCG/ACT inhaler Inhale 2 puffs into the lungs every 4 (four) hours as needed  for wheezing or shortness of breath. 07/19/19  Yes Sharion Balloon, NP  ?amLODipine (NORVASC) 10 MG tablet Take 10 mg by mouth daily. 06/21/21  Yes [provider]  ?clindamycin (CLEOCIN) 300 MG capsule Take 1 capsule (300 mg total) by mouth 3 (three) times daily. 08/08/21  Yes Joshwa Hemric, Derry Skill, PA-C  ?guaiFENesin (MUCINEX) 600 MG 12 hr tablet Take 1 tablet (600 mg total) by mouth 2 (two) times daily as needed for cough. 08/18/19  Yes Fawze, Mina A, PA-C  ?hydrochlorothiazide (HYDRODIURIL) 25 MG tablet Take 25 mg by mouth daily. 06/16/21  Yes [provider]  ? ? ?Family History ?Family History  ?Problem Relation Age of Onset  ? Hypertension Other   ? ? ?Social History ?Social History  ? ?Tobacco Use  ? Smoking status: Never  ? Smokeless tobacco: Never  ?Substance Use Topics  ? Alcohol use: Yes  ?  Comment: occasional  ? Drug use: No  ? ? ? ?Allergies   ?Penicillins, Codeine, Erythromycin, and Sulfa antibiotics ? ? ?Review of Systems ?Review of Systems  ?Constitutional:  Positive for activity change. Negative for appetite change, fatigue and fever.  ?HENT:  Positive for congestion and sore throat. Negative for sinus pressure and sneezing.   ?Respiratory:  Negative for cough and shortness of breath.   ?Cardiovascular:  Negative for chest pain.  ?Gastrointestinal:  Negative for abdominal pain, diarrhea, nausea and vomiting.  ?Neurological:  Negative for dizziness, light-headedness and headaches.  ? ? ?  Physical Exam ?Triage Vital Signs ?ED Triage Vitals [08/08/21 1519]  ?Enc Vitals Group  ?   BP (!) 158/87  ?   Pulse Rate 95  ?   Resp 18  ?   Temp (!) 97.4 ?F (36.3 ?C)  ?   Temp Source Oral  ?   SpO2 97 %  ?   Weight 280 lb (127 kg)  ?   Height 5\' 6"  (1.676 m)  ?   Head Circumference   ?   Peak Flow   ?   Pain Score 0  ?   Pain Loc   ?   Pain Edu?   ?   Excl. in [place]?   ? ?No data found. ? ?Updated Vital Signs ?BP (!) 158/87 (BP Location: Left Arm)   Pulse 95   Temp (!) 97.4 ?F (36.3 ?C) (Oral)   Resp 18   Ht  5\' 6"  (1.676 m)   Wt 280 lb (127 kg)   SpO2 97%   BMI 45.19 kg/m?  ? ?Visual Acuity ?Right Eye Distance:   ?Left Eye Distance:   ?Bilateral Distance:   ? ?Right Eye Near:   ?Left Eye Near:    ?Bilateral Near:    ? ?Physical Exam ?Vitals reviewed.  ?Constitutional:   ?   General: She is awake. She is not in acute distress. ?   Appearance: Normal appearance. She is well-developed. She is not ill-appearing.  ?   Comments: Very pleasant female appears stated age in no acute distress sitting comfortably in exam room  ?HENT:  ?   Head: Normocephalic and atraumatic.  ?   Right Ear: Tympanic membrane, ear canal and external ear normal. Tympanic membrane is not erythematous or bulging.  ?   Left Ear: Tympanic membrane, ear canal and external ear normal. Tympanic membrane is not erythematous or bulging.  ?   Nose:  ?   Right Sinus: No maxillary sinus tenderness or frontal sinus tenderness.  ?   Left Sinus: No maxillary sinus tenderness or frontal sinus tenderness.  ?   Mouth/Throat:  ?   Pharynx: Uvula midline. Posterior oropharyngeal erythema present. No oropharyngeal exudate.  ?Cardiovascular:  ?   Rate and Rhythm: Normal rate and regular rhythm.  ?   Heart sounds: Normal heart sounds, S1 normal and S2 normal. No murmur heard. ?Pulmonary:  ?   Effort: Pulmonary effort is normal.  ?   Breath sounds: Normal breath sounds. No wheezing, rhonchi or rales.  ?   Comments: Clear to auscultation bilaterally ?Psychiatric:     ?   Behavior: Behavior is cooperative.  ? ? ? ?UC Treatments / Results  ?Labs ?(all labs ordered are listed, but only abnormal results are displayed) ?Labs Reviewed  ?POCT RAPID STREP A (OFFICE) - Abnormal; Notable for the following components:  ?    Result Value  ? Rapid Strep A Screen Positive (*)   ? All other components within normal limits  ? ? ?EKG ? ? ?Radiology ?No results found. ? ?Procedures ?Procedures (including critical care time) ? ?Medications Ordered in UC ?Medications - No data to  display ? ?Initial Impression / Assessment and Plan / UC Course  ?I have reviewed the triage vital signs and the nursing notes. ? ?Pertinent labs & imaging results that were available during my care of the patient were reviewed by me and considered in my medical decision making (see chart for details). ? ?  ? ?Patient tested positive for strep pharyngitis.  She is  pen allergic and cannot tolerate macrolides so we will treat with clindamycin 300 mg 3 times daily.  Recommend she gargle with warm salt water and alternate Tylenol and ibuprofen for pain relief.  Discussed that she is contagious for 24 hours after starting medication and should avoid others for this timeframe.  She is to dispose and replace her toothbrush within a few days of starting antibiotics to prevent reinfection.  Discussed that if she has any worsening symptoms including high fever, muffled voice, dysphagia, odynophagia, swelling of her throat she needs to go to the emergency room.  Strict return precautions given to which she expressed understanding. ? ?Final Clinical Impressions(s) / UC Diagnoses  ? ?Final diagnoses:  ?Strep pharyngitis  ? ? ? ?Discharge Instructions   ? ?  ?You tested positive for strep pharyngitis.  You are contagious for 24 hours after starting medication.  Please take clindamycin 3 times a day.  Make sure to dispose of her toothbrush a few days after starting medication to prevent reinfection.  Gargle with warm salt water and use Tylenol or ibuprofen for pain relief.  If you have any worsening symptoms including difficulty speaking, difficulty swallowing, swelling of your throat, muffled voice, fever, nausea, vomiting you should go to the emergency room.  If symptoms or not improving please return for reevaluation. ? ? ? ? ?ED Prescriptions   ? ? Medication Sig Dispense Auth. Provider  ? clindamycin (CLEOCIN) 300 MG capsule Take 1 capsule (300 mg total) by mouth 3 (three) times daily. 30 capsule Consandra Laske K, PA-C  ? ?   ? ?PDMP not reviewed this encounter. ?  ?Terrilee Croak, PA-C ?08/08/21 1554 ? ?

## 2021-09-10 ENCOUNTER — Telehealth: Payer: Medicare Other | Admitting: Nurse Practitioner

## 2021-09-10 DIAGNOSIS — J020 Streptococcal pharyngitis: Secondary | ICD-10-CM | POA: Diagnosis not present

## 2021-09-10 MED ORDER — AZITHROMYCIN 250 MG PO TABS: ORAL_TABLET | ORAL | 0 refills | Status: DC

## 2021-09-10 NOTE — Patient Instructions (Signed)

## 2021-09-10 NOTE — Progress Notes (Signed)
Virtual Visit Consent   Megan Espinoza, you are scheduled for a virtual visit with Mary-Margaret Daphine Deutscher, FNP, a [facility] provider, today.     Just as with appointments in the office, your consent must be obtained to participate.  Your consent will be active for this visit and any virtual visit you may have with one of our providers in the next 365 days.     If you have a MyChart account, a copy of this consent can be sent to you electronically.  All virtual visits are billed to your insurance company just like a traditional visit in the office.    As this is a virtual visit, video technology does not allow for your provider to perform a traditional examination.  This may limit your provider's ability to fully assess your condition.  If your provider identifies any concerns that need to be evaluated in person or the need to arrange testing (such as labs, EKG, etc.), we will make arrangements to do so.     Although advances in technology are sophisticated, we cannot ensure that it will always work on either your end or our end.  If the connection with a video visit is poor, the visit may have to be switched to a telephone visit.  With either a video or telephone visit, we are not always able to ensure that we have a secure connection.     I need to obtain your verbal consent now.   Are you willing to proceed with your visit today? YES   Megan Espinoza has provided verbal consent on 09/10/2021 for a virtual visit (video or telephone).   Mary-Margaret Daphine Deutscher, FNP   Date: 09/10/2021 9:55 AM   Virtual Visit via Video Note   I, Mary-Margaret Daphine Deutscher, connected with Megan Espinoza ([id number], 66) on 09/10/21 at 10:00 AM EDT by a video-enabled telemedicine application and verified that I am speaking with the correct person using two identifiers.  Location: Patient: Virtual Visit Location Patient: Home Provider: Virtual Visit Location Provider: Mobile   I discussed the  limitations of evaluation and management by telemedicine and the availability of in person appointments. The patient expressed understanding and agreed to proceed.    History of Present Illness: Megan Espinoza is a 66 y.o. who identifies as a female who was assigned female at birth, and is being seen today for sore throat.  HPI: Patient does video visit c/o sore throat. She went to the ED on 08/08/21 and was dx with strep throat. Se was treated with and was treated with clindamycin. Her throat started hurting again in the past several days. Granddaughter was sick last week with sore throat and she lives with patient.  Sore Throat  This is a new problem. The current episode started yesterday. The problem has been gradually worsening. There has been no fever. The pain is at a severity of 5/10. The pain is moderate. Associated symptoms include trouble swallowing. Pertinent negatives include no congestion, coughing, ear pain, shortness of breath or swollen glands. She has had exposure to strep. She has tried nothing for the symptoms. The treatment provided no relief.   Review of Systems  HENT:  Positive for trouble swallowing. Negative for congestion and ear pain.   Respiratory:  Negative for cough and shortness of breath.    Problems:  Patient Active Problem List   Diagnosis Date Noted   Physical deconditioning 04/17/2012   Influenza A 03/31/2012   Acute renal insufficiency 03/30/2012  ARDS (adult respiratory distress syndrome) ([facility]) 03/28/2012   Steroid-induced hyperglycemia 03/28/2012   Acute respiratory failure with hypoxia ([facility]) 03/27/2012   Microcytic anemia 03/24/2012   Obesity (BMI 30.0-34.9) 03/24/2012    Allergies:  Allergies  Allergen Reactions   Penicillins Swelling   Codeine Nausea And Vomiting   Erythromycin     Mouth thrush- per pt   Sulfa Antibiotics     Mouth thrush- per pt   Medications:  Current Outpatient Medications:    acetaminophen (TYLENOL) 325 MG tablet,  Take 2 tablets (650 mg total) by mouth every 6 (six) hours as needed (or Fever >/= 101)., Disp: , Rfl:    albuterol (VENTOLIN HFA) 108 (90 Base) MCG/ACT inhaler, Inhale 2 puffs into the lungs every 4 (four) hours as needed for wheezing or shortness of breath., Disp: 18 g, Rfl: 0   amLODipine (NORVASC) 10 MG tablet, Take 10 mg by mouth daily., Disp: , Rfl:    clindamycin (CLEOCIN) 300 MG capsule, Take 1 capsule (300 mg total) by mouth 3 (three) times daily., Disp: 30 capsule, Rfl: 0   guaiFENesin (MUCINEX) 600 MG 12 hr tablet, Take 1 tablet (600 mg total) by mouth 2 (two) times daily as needed for cough., Disp: 30 tablet, Rfl: 0   hydrochlorothiazide (HYDRODIURIL) 25 MG tablet, Take 25 mg by mouth daily., Disp: , Rfl:   Observations/Objective: Patient is well-developed, well-nourished in no acute distress.  Resting comfortably  at home.  Head is normocephalic, atraumatic.  No labored breathing.  Speech is clear and coherent with logical content.  Patient is alert and oriented at baseline.  Cannot visualize throat.  Assessment and Plan:  Megan Espinoza in today with chief complaint of Sore Throat   1. Pharyngitis due to Streptococcus species Force fluids Motrin or tylenol OTC OTC decongestant Throat lozenges if help New toothbrush in 3 days Meds ordered this encounter  Medications   azithromycin (ZITHROMAX Z-PAK) 250 MG tablet    Sig: As directed    Dispense:  6 tablet    Refill:  0    Order Specific Question:   Supervising Provider    Answer:   Eber Hong [[id number]]       Follow Up Instructions: I discussed the assessment and treatment plan with the patient. The patient was provided an opportunity to ask questions and all were answered. The patient agreed with the plan and demonstrated an understanding of the instructions.  A copy of instructions were sent to the patient via MyChart.  The patient was advised to call back or seek an in-person evaluation if the symptoms  worsen or if the condition fails to improve as anticipated.  Time:  I spent 12 minutes with the patient via telehealth technology discussing the above problems/concerns.    Mary-Margaret Daphine Deutscher, FNP

## 2022-11-18 ENCOUNTER — Emergency Department (HOSPITAL_BASED_OUTPATIENT_CLINIC_OR_DEPARTMENT_OTHER): Payer: Medicare HMO | Admitting: Radiology

## 2022-11-18 ENCOUNTER — Emergency Department (HOSPITAL_BASED_OUTPATIENT_CLINIC_OR_DEPARTMENT_OTHER)
Admission: EM | Admit: 2022-11-18 | Discharge: 2022-11-18 | Disposition: A | Discharge status: Home / Self Care | Payer: Medicare HMO | Source: Home / Self Care | Attending: Emergency Medicine | Admitting: Emergency Medicine

## 2022-11-18 ENCOUNTER — Other Ambulatory Visit: Payer: Self-pay

## 2022-11-18 ENCOUNTER — Other Ambulatory Visit (HOSPITAL_BASED_OUTPATIENT_CLINIC_OR_DEPARTMENT_OTHER): Payer: Self-pay

## 2022-11-18 ENCOUNTER — Encounter (HOSPITAL_BASED_OUTPATIENT_CLINIC_OR_DEPARTMENT_OTHER): Payer: Self-pay | Admitting: Emergency Medicine

## 2022-11-18 DIAGNOSIS — I4891 Unspecified atrial fibrillation: Secondary | ICD-10-CM | POA: Diagnosis present

## 2022-11-18 DIAGNOSIS — D649 Anemia, unspecified: Secondary | ICD-10-CM | POA: Insufficient documentation

## 2022-11-18 DIAGNOSIS — Z79899 Other long term (current) drug therapy: Secondary | ICD-10-CM | POA: Insufficient documentation

## 2022-11-18 DIAGNOSIS — R9431 Abnormal electrocardiogram [ECG] [EKG]: Secondary | ICD-10-CM | POA: Insufficient documentation

## 2022-11-18 DIAGNOSIS — I10 Essential (primary) hypertension: Secondary | ICD-10-CM | POA: Diagnosis not present

## 2022-11-18 LAB — URINALYSIS, ROUTINE W REFLEX MICROSCOPIC
Bilirubin Urine: NEGATIVE
Glucose, UA: NEGATIVE mg/dL
Ketones, ur: NEGATIVE mg/dL
Nitrite: NEGATIVE
Protein, ur: 30 mg/dL — AB
RBC / HPF: >50 RBC/hpf (ref 0–5)
Specific Gravity, Urine: 1.019 (ref 1.005–1.030)
WBC, UA: >50 WBC/hpf (ref 0–5)
pH: 6.5 (ref 5.0–8.0)

## 2022-11-18 LAB — BASIC METABOLIC PANEL
Anion gap: 12 (ref 5–15)
BUN: 26 mg/dL — ABNORMAL HIGH (ref 8–23)
CO2: 26 mmol/L (ref 22–32)
Calcium: 9.3 mg/dL (ref 8.9–10.3)
Chloride: 102 mmol/L (ref 98–111)
Creatinine, Ser: 1.26 mg/dL — ABNORMAL HIGH (ref 0.44–1.00)
GFR, Estimated: 47 mL/min — ABNORMAL LOW (ref >60)
Glucose, Bld: 130 mg/dL — ABNORMAL HIGH (ref 70–99)
Potassium: 3.5 mmol/L (ref 3.5–5.1)
Sodium: 140 mmol/L (ref 135–145)

## 2022-11-18 LAB — CBC
HCT: 36.4 % (ref 36.0–46.0)
Hemoglobin: 11.6 g/dL — ABNORMAL LOW (ref 12.0–15.0)
MCH: 24.8 pg — ABNORMAL LOW (ref 26.0–34.0)
MCHC: 31.9 g/dL (ref 30.0–36.0)
MCV: 77.8 fL — ABNORMAL LOW (ref 80.0–100.0)
Platelets: 283 10*3/uL (ref 150–400)
RBC: 4.68 MIL/uL (ref 3.87–5.11)
RDW: 15.5 % (ref 11.5–15.5)
WBC: 8.2 10*3/uL (ref 4.0–10.5)
nRBC: 0 % (ref 0.0–0.2)

## 2022-11-18 LAB — TROPONIN I (HIGH SENSITIVITY): Troponin I (High Sensitivity): 7 ng/L (ref <18)

## 2022-11-18 LAB — TSH: TSH: 2.699 u[IU]/mL (ref 0.350–4.500)

## 2022-11-18 MED ORDER — NITROFURANTOIN MONOHYD MACRO 100 MG PO CAPS
100.0000 mg | ORAL_CAPSULE | Freq: Two times a day (BID) | ORAL | 0 refills | Status: DC
  Filled 2022-11-18: qty 10, 5d supply, fill #0

## 2022-11-18 NOTE — ED Provider Notes (Signed)
Megan Espinoza Provider Note   CSN: [id number] Arrival date & time: 11/18/22  1055     History  Chief Complaint  Patient presents with   Atrial Fibrillation    Megan Espinoza is a 67 y.o. female.   Atrial Fibrillation  67 year old female history of hypertension presenting for concern for atrial fibrillation.  Patient states she recently had a PCP routine visit.  She did not have any concerns at the time.  They got an echocardiogram for unclear reasons.  She went to get this today and they did an EKG and it was concerning for A-fib so she was sent here.  She states she is completely asymptomatic.  She does not want to be here.  She has no chest pain, palpitations, shortness of breath, dizziness, syncope.  No history of A-fib, she is not on anticoagulation.  No history of thyroid disorder.  No recent vomiting or diarrhea.  She has had normal p.o. intake.  No change in her medications.  She is on phentermine for weight loss.  She reports she is very anxious and feels like her heart rate is usually high.     Home Medications Prior to Admission medications   Medication Sig Start Date End Date Taking? Authorizing Provider  nitrofurantoin, macrocrystal-monohydrate, (MACROBID) 100 MG capsule Take 1 capsule (100 mg total) by mouth 2 (two) times daily. 11/18/22  Yes Laurence Spates, MD  phentermine (ADIPEX-P) 37.5 MG tablet Take 37.5 mg by mouth daily. 11/02/22  Yes [provider]  acetaminophen (TYLENOL) 325 MG tablet Take 2 tablets (650 mg total) by mouth every 6 (six) hours as needed (or Fever >/= 101). 04/14/12   Simonne Martinet, NP  albuterol (VENTOLIN HFA) 108 (90 Base) MCG/ACT inhaler Inhale 2 puffs into the lungs every 4 (four) hours as needed for wheezing or shortness of breath. 07/19/19   Mickie Bail, NP  amLODipine (NORVASC) 10 MG tablet Take 10 mg by mouth daily. 06/21/21   [provider]  azithromycin (ZITHROMAX  Z-PAK) 250 MG tablet As directed 09/10/21   Daphine Deutscher, Mary-Margaret, FNP  clindamycin (CLEOCIN) 300 MG capsule Take 1 capsule (300 mg total) by mouth 3 (three) times daily. 08/08/21   Raspet, Noberto Retort, PA-C  guaiFENesin (MUCINEX) 600 MG 12 hr tablet Take 1 tablet (600 mg total) by mouth 2 (two) times daily as needed for cough. 08/18/19   Fawze, Mina A, PA-C  hydrochlorothiazide (HYDRODIURIL) 25 MG tablet Take 25 mg by mouth daily. 06/16/21   [provider]      Allergies    Penicillins, Codeine, Erythromycin, and Sulfa antibiotics    Review of Systems   Review of Systems Review of systems completed and notable as per HPI.  ROS otherwise negative.   Physical Exam Updated Vital Signs BP 136/78 (BP Location: Right Arm)   Pulse 91   Temp 98 F (36.7 C) (Oral)   Resp 18   Ht 5\' 6"  (1.676 m)   Wt 133.8 kg   SpO2 99%   BMI 47.61 kg/m  Physical Exam Vitals and nursing note reviewed.  Constitutional:      General: She is not in acute distress.    Appearance: She is well-developed.  HENT:     Head: Normocephalic and atraumatic.     Mouth/Throat:     Mouth: Mucous membranes are moist.     Pharynx: Oropharynx is clear.  Eyes:     Extraocular Movements: Extraocular movements intact.  Conjunctiva/sclera: Conjunctivae normal.     Pupils: Pupils are equal, round, and reactive to light.  Cardiovascular:     Rate and Rhythm: Normal rate and regular rhythm.     Heart sounds: No murmur heard. Pulmonary:     Effort: Pulmonary effort is normal. No respiratory distress.     Breath sounds: Normal breath sounds.  Abdominal:     Palpations: Abdomen is soft.     Tenderness: There is no abdominal tenderness.  Musculoskeletal:        General: No swelling.     Cervical back: Neck supple.     Right lower leg: No edema.     Left lower leg: No edema.  Skin:    General: Skin is warm and dry.     Capillary Refill: Capillary refill takes less than 2 seconds.  Neurological:     General: No  focal deficit present.     Mental Status: She is alert and oriented to person, place, and time. Mental status is at baseline.  Psychiatric:        Mood and Affect: Mood normal.     ED Results / Procedures / Treatments   Labs (all labs ordered are listed, but only abnormal results are displayed) Labs Reviewed  BASIC METABOLIC PANEL - Abnormal; Notable for the following components:      Result Value   Glucose, Bld 130 (*)    BUN 26 (*)    Creatinine, Ser 1.26 (*)    GFR, Estimated 47 (*)    All other components within normal limits  CBC - Abnormal; Notable for the following components:   Hemoglobin 11.6 (*)    MCV 77.8 (*)    MCH 24.8 (*)    All other components within normal limits  URINALYSIS, ROUTINE W REFLEX MICROSCOPIC - Abnormal; Notable for the following components:   APPearance CLOUDY (*)    Hgb urine dipstick LARGE (*)    Protein, ur 30 (*)    Leukocytes,Ua LARGE (*)    Bacteria, UA MANY (*)    Non Squamous Epithelial 0-5 (*)    All other components within normal limits  TSH  TROPONIN I (HIGH SENSITIVITY)    EKG EKG Interpretation Date/Time:  Wednesday November 18 2022 11:06:24 EDT Ventricular Rate:  102 PR Interval:  147 QRS Duration:  108 QT Interval:  375 QTC Calculation: 489 R Axis:   2  Text Interpretation: Sinus arrhythmia Ventricular premature complex Low voltage, precordial leads RSR' in V1 or V2, right VCD or RVH Borderline prolonged QT interval Confirmed by Fulton Reek [phone]) on 11/18/2022 11:16:17 AM  Radiology DG Chest 2 View  Result Date: 11/18/2022 CLINICAL DATA:  Atrial fibrillation EXAM: CHEST - 2 VIEW COMPARISON:  10/03/2015 and older FINDINGS: Underinflation. There is some mild left retrocardiac possible small hiatal hernia. No pneumothorax or effusion. No edema. Overlapping cardiac leads. Normal cardiopericardial silhouette when adjusting for level of inflation. Degenerative changes of the spine. Surgical clips in the right upper quadrant of  the abdomen IMPRESSION: Slight left basilar atelectasis.  Possible small hiatal hernia. Electronically Signed   By: Karen Kays M.D.   On: 11/18/2022 12:16    Procedures Procedures    Medications Ordered in ED Medications - No data to display  ED Course/ Medical Decision Making/ A&P                                 Medical Decision  Making Amount and/or Complexity of Data Reviewed Labs: ordered. Radiology: ordered.  Risk Prescription drug management.   Medical Decision Making:   MELADIE ING is a 67 y.o. female who presented to the ED today with concern for atrial fibrillation.  Also notable for mild tachycardia and mild hypertension.  On exam she is asymptomatic and has not had any recent symptoms at all.  I reviewed her EKG from her primary care office.  It is read as atrial fibrillation.  There is significant baseline artifact which makes it hard to interpret, however I think I see regular P waves with occasional PAC and PVC.  I do not see anything convincing for atrial fibrillation.  Repeat EKG here shows no atrial fibrillation, shows sinus rhythm, incomplete right bundle branch block, occasional PAC and PVC.  I suspect her mild tachycardia and PACs could be related to phentermine, I also reviewed her prior visits in our system and her heart rate is almost always above 90.  She has no chest pain at all, not consistent with ACS.  Troponin was drawn in triage will follow this up.  She has no shortness of breath, recent travel, leg swelling or history of DVT or PE, I have low concern for PE.  Will follow-up workup and reassess.   Patient placed on continuous vitals and telemetry monitoring while in ED which was reviewed periodically.  Reviewed and confirmed nursing documentation for past medical history, family history, social history.  Initial Study Results:   Laboratory  All laboratory results reviewed.  Labs notable for creatinine similar to baseline.  Troponin normal.  CBC  with stable anemia.  UTI.  EKG EKG was reviewed independently. Rate, rhythm, axis, intervals all examined and without medically relevant abnormality. ST segments without concerns for elevations.    Radiology:  All images reviewed independently.  Chest x-ray unremarkable.  Agree with radiology report at this time.    Reassessment and Plan:   I reassessed patient multiple times and she was always in sinus rhythm.  I do not see any evidence that she was in atrial fibrillation and I suspect this was artifact.  I had a long discussion with her about this.  We discussed that if she is having episodes of atrial fibrillation she will need anticoagulation.  However given we have not seen any atrial fibrillation I think would be high risk to put her on anticoagulation.  She was understanding of this and did not want to start anticoagulation given do not see any evidence of atrial fibrillation.  I did recommend she stop the phentermine given her mild tachycardia and PACs.  Will also treat her for UTI.  She is otherwise asymptomatic and feeling well.  I think she can safely follow-up with her PCP.  I did give her strict return precautions.   Patient's presentation is most consistent with acute complicated illness / injury requiring diagnostic workup.           Final Clinical Impression(s) / ED Diagnoses Final diagnoses:  EKG abnormality    Rx / DC Orders ED Discharge Orders          Ordered    nitrofurantoin, macrocrystal-monohydrate, (MACROBID) 100 MG capsule  2 times daily        11/18/22 1412              Laurence Spates, MD 11/18/22 1537

## 2022-11-18 NOTE — ED Triage Notes (Signed)
Pt via pov from pcp with new onset afib. Pt was at routine appointment and they did an EKG, which shows afib with RVR. Pt denies hx of the same. Pt denies sob, states she has lung issues due to a breathing tube for 28 days when she had flu 10 years ago. Pt alert & oriented, nad noted.

## 2022-11-18 NOTE — Discharge Instructions (Signed)
Your EKG here and your monitor here did not show any evidence of atrial fibrillation.  We discussed this, I recommend he follow-up closely with your primary care physician.  Your urinalysis is concerning for a UTI.  You are being started on antibiotics and should finish the entire course.  You develop fever, severe pain, chest pain, dizziness, lightheadedness you should return to the ED.

## 2023-01-12 ENCOUNTER — Emergency Department (HOSPITAL_COMMUNITY): Payer: Medicare HMO

## 2023-01-12 ENCOUNTER — Inpatient Hospital Stay (HOSPITAL_COMMUNITY)
Admission: EM | Admit: 2023-01-12 | Discharge: 2023-01-15 | DRG: 377 | Disposition: A | Discharge status: Home / Self Care | Payer: Medicare HMO | Attending: Family Medicine | Admitting: Family Medicine

## 2023-01-12 ENCOUNTER — Other Ambulatory Visit: Payer: Self-pay

## 2023-01-12 ENCOUNTER — Encounter (HOSPITAL_COMMUNITY): Payer: Self-pay

## 2023-01-12 ENCOUNTER — Inpatient Hospital Stay (HOSPITAL_COMMUNITY): Payer: Medicare HMO

## 2023-01-12 DIAGNOSIS — K254 Chronic or unspecified gastric ulcer with hemorrhage: Secondary | ICD-10-CM | POA: Diagnosis present

## 2023-01-12 DIAGNOSIS — N3000 Acute cystitis without hematuria: Secondary | ICD-10-CM | POA: Diagnosis present

## 2023-01-12 DIAGNOSIS — M25561 Pain in right knee: Secondary | ICD-10-CM | POA: Diagnosis present

## 2023-01-12 DIAGNOSIS — I4819 Other persistent atrial fibrillation: Secondary | ICD-10-CM | POA: Diagnosis present

## 2023-01-12 DIAGNOSIS — W19XXXA Unspecified fall, initial encounter: Secondary | ICD-10-CM | POA: Diagnosis present

## 2023-01-12 DIAGNOSIS — R29700 NIHSS score 0: Secondary | ICD-10-CM | POA: Diagnosis present

## 2023-01-12 DIAGNOSIS — Z7901 Long term (current) use of anticoagulants: Secondary | ICD-10-CM

## 2023-01-12 DIAGNOSIS — Z880 Allergy status to penicillin: Secondary | ICD-10-CM

## 2023-01-12 DIAGNOSIS — M25562 Pain in left knee: Secondary | ICD-10-CM | POA: Diagnosis present

## 2023-01-12 DIAGNOSIS — D62 Acute posthemorrhagic anemia: Secondary | ICD-10-CM | POA: Diagnosis present

## 2023-01-12 DIAGNOSIS — R410 Disorientation, unspecified: Secondary | ICD-10-CM

## 2023-01-12 DIAGNOSIS — D125 Benign neoplasm of sigmoid colon: Secondary | ICD-10-CM | POA: Diagnosis present

## 2023-01-12 DIAGNOSIS — Z882 Allergy status to sulfonamides status: Secondary | ICD-10-CM

## 2023-01-12 DIAGNOSIS — Z885 Allergy status to narcotic agent status: Secondary | ICD-10-CM

## 2023-01-12 DIAGNOSIS — R8271 Bacteriuria: Secondary | ICD-10-CM | POA: Diagnosis present

## 2023-01-12 DIAGNOSIS — D649 Anemia, unspecified: Principal | ICD-10-CM | POA: Diagnosis present

## 2023-01-12 DIAGNOSIS — K3189 Other diseases of stomach and duodenum: Secondary | ICD-10-CM | POA: Diagnosis present

## 2023-01-12 DIAGNOSIS — I10 Essential (primary) hypertension: Secondary | ICD-10-CM | POA: Diagnosis present

## 2023-01-12 DIAGNOSIS — J449 Chronic obstructive pulmonary disease, unspecified: Secondary | ICD-10-CM | POA: Diagnosis present

## 2023-01-12 DIAGNOSIS — I639 Cerebral infarction, unspecified: Secondary | ICD-10-CM | POA: Diagnosis not present

## 2023-01-12 DIAGNOSIS — K648 Other hemorrhoids: Secondary | ICD-10-CM | POA: Diagnosis present

## 2023-01-12 DIAGNOSIS — I6381 Other cerebral infarction due to occlusion or stenosis of small artery: Secondary | ICD-10-CM | POA: Diagnosis present

## 2023-01-12 DIAGNOSIS — R4701 Aphasia: Secondary | ICD-10-CM | POA: Diagnosis present

## 2023-01-12 DIAGNOSIS — D6832 Hemorrhagic disorder due to extrinsic circulating anticoagulants: Secondary | ICD-10-CM | POA: Diagnosis present

## 2023-01-12 DIAGNOSIS — H3530 Unspecified macular degeneration: Secondary | ICD-10-CM | POA: Diagnosis present

## 2023-01-12 DIAGNOSIS — Z6841 Body Mass Index (BMI) 40.0 and over, adult: Secondary | ICD-10-CM | POA: Diagnosis not present

## 2023-01-12 DIAGNOSIS — Z9049 Acquired absence of other specified parts of digestive tract: Secondary | ICD-10-CM

## 2023-01-12 DIAGNOSIS — E669 Obesity, unspecified: Secondary | ICD-10-CM | POA: Diagnosis present

## 2023-01-12 DIAGNOSIS — E785 Hyperlipidemia, unspecified: Secondary | ICD-10-CM | POA: Diagnosis present

## 2023-01-12 DIAGNOSIS — K922 Gastrointestinal hemorrhage, unspecified: Secondary | ICD-10-CM

## 2023-01-12 DIAGNOSIS — R011 Cardiac murmur, unspecified: Secondary | ICD-10-CM | POA: Diagnosis present

## 2023-01-12 DIAGNOSIS — K5731 Diverticulosis of large intestine without perforation or abscess with bleeding: Secondary | ICD-10-CM | POA: Diagnosis present

## 2023-01-12 DIAGNOSIS — D126 Benign neoplasm of colon, unspecified: Secondary | ICD-10-CM | POA: Diagnosis not present

## 2023-01-12 DIAGNOSIS — K449 Diaphragmatic hernia without obstruction or gangrene: Secondary | ICD-10-CM | POA: Diagnosis present

## 2023-01-12 DIAGNOSIS — Z881 Allergy status to other antibiotic agents status: Secondary | ICD-10-CM

## 2023-01-12 DIAGNOSIS — Z9842 Cataract extraction status, left eye: Secondary | ICD-10-CM

## 2023-01-12 DIAGNOSIS — K219 Gastro-esophageal reflux disease without esophagitis: Secondary | ICD-10-CM | POA: Diagnosis present

## 2023-01-12 DIAGNOSIS — K5900 Constipation, unspecified: Secondary | ICD-10-CM | POA: Diagnosis present

## 2023-01-12 DIAGNOSIS — D509 Iron deficiency anemia, unspecified: Secondary | ICD-10-CM

## 2023-01-12 DIAGNOSIS — K259 Gastric ulcer, unspecified as acute or chronic, without hemorrhage or perforation: Secondary | ICD-10-CM | POA: Diagnosis not present

## 2023-01-12 DIAGNOSIS — K2289 Other specified disease of esophagus: Secondary | ICD-10-CM | POA: Diagnosis present

## 2023-01-12 DIAGNOSIS — I6389 Other cerebral infarction: Secondary | ICD-10-CM | POA: Diagnosis not present

## 2023-01-12 DIAGNOSIS — Z79899 Other long term (current) drug therapy: Secondary | ICD-10-CM

## 2023-01-12 DIAGNOSIS — Z9841 Cataract extraction status, right eye: Secondary | ICD-10-CM

## 2023-01-12 LAB — CBC
HCT: 24.4 % — ABNORMAL LOW (ref 36.0–46.0)
Hemoglobin: 7 g/dL — ABNORMAL LOW (ref 12.0–15.0)
MCH: 23.6 pg — ABNORMAL LOW (ref 26.0–34.0)
MCHC: 28.7 g/dL — ABNORMAL LOW (ref 30.0–36.0)
MCV: 82.4 fL (ref 80.0–100.0)
Platelets: 342 10*3/uL (ref 150–400)
RBC: 2.96 MIL/uL — ABNORMAL LOW (ref 3.87–5.11)
RDW: 15.7 % — ABNORMAL HIGH (ref 11.5–15.5)
WBC: 9.2 10*3/uL (ref 4.0–10.5)
nRBC: 0 % (ref 0.0–0.2)

## 2023-01-12 LAB — URINALYSIS, ROUTINE W REFLEX MICROSCOPIC
Bilirubin Urine: NEGATIVE
Glucose, UA: NEGATIVE mg/dL
Ketones, ur: NEGATIVE mg/dL
Nitrite: POSITIVE — AB
Protein, ur: 30 mg/dL — AB
Specific Gravity, Urine: 1.016 (ref 1.005–1.030)
WBC, UA: >50 WBC/hpf (ref 0–5)
pH: 6 (ref 5.0–8.0)

## 2023-01-12 LAB — COMPREHENSIVE METABOLIC PANEL
ALT: 14 U/L (ref 0–44)
AST: 18 U/L (ref 15–41)
Albumin: 2.8 g/dL — ABNORMAL LOW (ref 3.5–5.0)
Alkaline Phosphatase: 96 U/L (ref 38–126)
Anion gap: 10 (ref 5–15)
BUN: 23 mg/dL (ref 8–23)
CO2: 25 mmol/L (ref 22–32)
Calcium: 8.8 mg/dL — ABNORMAL LOW (ref 8.9–10.3)
Chloride: 104 mmol/L (ref 98–111)
Creatinine, Ser: 1.02 mg/dL — ABNORMAL HIGH (ref 0.44–1.00)
GFR, Estimated: >60 mL/min (ref >60)
Glucose, Bld: 110 mg/dL — ABNORMAL HIGH (ref 70–99)
Potassium: 3.7 mmol/L (ref 3.5–5.1)
Sodium: 139 mmol/L (ref 135–145)
Total Bilirubin: 0.5 mg/dL (ref 0.3–1.2)
Total Protein: 6.4 g/dL — ABNORMAL LOW (ref 6.5–8.1)

## 2023-01-12 LAB — I-STAT CG4 LACTIC ACID, ED
Lactic Acid, Venous: 0.8 mmol/L (ref 0.5–1.9)
Lactic Acid, Venous: 0.7 mmol/L (ref 0.5–1.9)

## 2023-01-12 LAB — DIFFERENTIAL
Abs Immature Granulocytes: 0.06 10*3/uL (ref 0.00–0.07)
Basophils Absolute: 0 10*3/uL (ref 0.0–0.1)
Basophils Relative: 0 %
Eosinophils Absolute: 0.4 10*3/uL (ref 0.0–0.5)
Eosinophils Relative: 5 %
Immature Granulocytes: 1 %
Lymphocytes Relative: 17 %
Lymphs Abs: 1.6 10*3/uL (ref 0.7–4.0)
Monocytes Absolute: 0.7 10*3/uL (ref 0.1–1.0)
Monocytes Relative: 8 %
Neutro Abs: 6.5 10*3/uL (ref 1.7–7.7)
Neutrophils Relative %: 69 %

## 2023-01-12 LAB — PROTIME-INR
INR: 1.2 (ref 0.8–1.2)
Prothrombin Time: 15.4 seconds — ABNORMAL HIGH (ref 11.4–15.2)

## 2023-01-12 LAB — TROPONIN I (HIGH SENSITIVITY)
Troponin I (High Sensitivity): 27 ng/L — ABNORMAL HIGH (ref <18)
Troponin I (High Sensitivity): 6 ng/L (ref <18)

## 2023-01-12 LAB — POC OCCULT BLOOD, ED: Fecal Occult Bld: POSITIVE — AB

## 2023-01-12 LAB — PREPARE RBC (CROSSMATCH)

## 2023-01-12 LAB — ETHANOL: Alcohol, Ethyl (B): <10 mg/dL (ref <10)

## 2023-01-12 LAB — APTT: aPTT: 29 seconds (ref 24–36)

## 2023-01-12 MED ORDER — HYDROCHLOROTHIAZIDE 12.5 MG PO TABS: 12.5000 mg | ORAL_TABLET | Freq: Every day | ORAL | Status: DC

## 2023-01-12 MED ORDER — POLYETHYLENE GLYCOL 3350 17 G PO PACK: 17.0000 g | PACK | Freq: Every day | ORAL | Status: DC | PRN

## 2023-01-12 MED ORDER — AMLODIPINE BESYLATE 5 MG PO TABS: 10.0000 mg | ORAL_TABLET | Freq: Every day | ORAL | Status: DC

## 2023-01-12 MED ORDER — SODIUM CHLORIDE 0.9 % IV SOLN
1.0000 g | Freq: Once | INTRAVENOUS | Status: AC
  Administered 2023-01-12: 1 g via INTRAVENOUS
  Filled 2023-01-12: qty 10

## 2023-01-12 MED ORDER — CYCLOBENZAPRINE HCL 10 MG PO TABS
10.0000 mg | ORAL_TABLET | Freq: Every day | ORAL | Status: DC
  Administered 2023-01-12 – 2023-01-14 (×3): 10 mg via ORAL
  Filled 2023-01-12 (×3): qty 1

## 2023-01-12 MED ORDER — HYDROXYZINE HCL 25 MG PO TABS
25.0000 mg | ORAL_TABLET | Freq: Once | ORAL | Status: AC
  Administered 2023-01-12: 25 mg via ORAL
  Filled 2023-01-12: qty 1

## 2023-01-12 MED ORDER — PANTOPRAZOLE 80MG IVPB - SIMPLE MED
80.0000 mg | Freq: Once | INTRAVENOUS | Status: AC
  Administered 2023-01-12: 80 mg via INTRAVENOUS
  Filled 2023-01-12: qty 100

## 2023-01-12 MED ORDER — PANTOPRAZOLE INFUSION (NEW) - SIMPLE MED
8.0000 mg/h | INTRAVENOUS | Status: DC
  Administered 2023-01-12 – 2023-01-13 (×2): 8 mg/h via INTRAVENOUS
  Filled 2023-01-12 (×2): qty 100

## 2023-01-12 MED ORDER — HYDROXYZINE HCL 10 MG PO TABS: 10.0000 mg | ORAL_TABLET | Freq: Once | ORAL | Status: DC

## 2023-01-12 MED ORDER — SODIUM CHLORIDE 0.9% IV SOLUTION: Freq: Once | INTRAVENOUS | Status: DC

## 2023-01-12 MED ORDER — SODIUM CHLORIDE 0.9% FLUSH: 3.0000 mL | Freq: Once | INTRAVENOUS | Status: DC

## 2023-01-12 MED ORDER — METOPROLOL SUCCINATE ER 25 MG PO TB24: 25.0000 mg | ORAL_TABLET | Freq: Every day | ORAL | Status: DC

## 2023-01-12 MED ORDER — ALBUTEROL SULFATE (2.5 MG/3ML) 0.083% IN NEBU: 2.5000 mg | INHALATION_SOLUTION | RESPIRATORY_TRACT | Status: DC | PRN

## 2023-01-12 NOTE — H&P (Cosign Needed)
Hospital Admission History and Physical Service Pager: [phone]  Patient name: Megan Espinoza Medical record number: [id number] Date of Birth: 02-24-1956 Age: 67 y.o. Gender: female  Primary Care Provider: Ellyn Hack, MD Consultants: GI  Code Status: Full Code Preferred Emergency Contact:  Contact Information     Name Relation Home Work Mobile   [place] Significant other [phone]        Other Contacts   None on File      Chief Complaint: Symptomatic Anemia  Assessment and Plan: Megan Espinoza is a 67 y.o. female presenting with symptomatic anemia, GI bleed and urinary tract infection. Differential for the GI bleed includes diverticular disease, peptic/duodenal ulcer, internal hemorrhoids. Cannot rule out occult malignancy.   Assessment & Plan Symptomatic anemia Pt initially presented from home with altered mental status that improved over the course of the day. Fecal occult blood detected on card. Think that anemia is likely secondary to a GI bleed. Pt reports no history of dark, tarry stools, no bright red blood in stools. No history of bleeding she is aware of. Chart review does not provide a history of endoscopy or colonoscopy. - Pt found to have hgb of 7.0 at admission - Type/Screened in ED, 1 unit of pRBC transfused. - Hgb check at 230 am (4 hours post transfusion) - GI consulted, will come see her 09/25. - Pt NPO after midnight - BMP, CBC AM labs  Acute cystitis without hematuria Pt was found to have a urinary tract infection (large nitrites, culture pending) on admission. Pt states that she has not had dysuria, but always feels increased urgency to urinate. No history of hematuria. - Pt's last UTI was 10/2022  - Urine Culture Pending, drawn 9/24 - 1 Dose ceftriaxone given in ED, will hold off additional doses until sensitivities/specificities come back    Chronic and Stable Problems:  Atrial Fibrillation: XARELTO 20 MG tablet, last  took 9/23, metoprolol XR 25 MG , last took 9/23 Hypertension: amLODipine10 MG tablet, hydrochlorothiazide 12.5 mg COPD: Albuterol Q4 PRN  FEN/GI: PPI, Regular Diet VTE Prophylaxis: SCDs (active bleed)  Disposition: Admitted to Progressive care unit  History of Present Illness:  Megan Espinoza is a 67 y.o. female presenting with dizziness and weakness.   Dropped her granddaughter off at kindergarten, and felt the music was sounding weird. Then went home to do some work, and started having a hard time typing (typing the wrong words than she intended). Notes she fell earlier today while walking and her foot got caught under the chair. She fell on her bottom, got up from the fall and was okay, and went home. She felt bad and decided to call her PCP who recommended she go to ED, because of her A. Fib. Patient notes she began not feeling well/tired, over the weekend.   Denies any CP, but has SOB at baseline 2/2 her lung scarring. Denies any dizziness, nausea, vomiting.   UTI, had one in July, denies any dysuria or increased frequency. Took 5 days of abx the last time this happened. Always has a constant urgency to go, since she's had kids. Denies any back pain.   In the ED, patient was transfused 1 unit PRBC  Review Of Systems: Per HPI with the following additions:   Pertinent Past Medical History: H1N1 Intubated causing lung scarring A. Fib HTN Constipation Prediabetes Macular Degeneration COPD Hrt Murmur  Remainder reviewed in history tab.   Pertinent Past Surgical History: Hernia Repair (  Umbilical) - 30 yrs ago Lap Chole - 25 yrs ago Cataracts Bilat - last year Remainder reviewed in history tab.   Pertinent Social History: Tobacco use: No Alcohol use: A month ago, a glass of wine with dinner 1 x week Other Substance use: None Lives with Husband, grand granddaughter Elishia Roethel 4 yo  Pertinent Family History: Brain Cancer - Mom and Dad  Remainder reviewed in  history tab.   Important Outpatient Medications: acetaminophen (TYLENOL) 325 MG tablet albuterol (VENTOLIN HFA) 108 (90 Base) MCG/ACT inhaler amLODipine (NORVASC) 10 MG tablet cyclobenzaprine (FLEXERIL) 10 MG tablet hydrochlorothiazide (HYDRODIURIL) 25 MG tablet (12.5 mg) metoprolol succinate (TOPROL-XL) 25 MG 24 hr tablet naproxen sodium (ALEVE) 220 MG tablet 480 mg BID omeprazole (PRILOSEC) 20 MG capsule sodium chloride flush (NS) 0.9 % injection 3 mL XARELTO 20 MG TABS tablet, last took last night   Remainder reviewed in medication history.   Objective: BP (!) 138/59   Pulse 94   Temp 98.2 F (36.8 C) (Oral)   Resp 20   Ht 5\' 6"  (1.676 m)   Wt 133.8 kg   SpO2 99%   BMI 47.61 kg/m  Exam: General: NAD, Pleasant, conversant woman who appears her stated age. Eyes: PERRL, EOMI. Non-icteric sclera. Peripheral vision approximately normal ENTM: MMM Neck: Soft, supple Cardiovascular: Holosystolic murmur present, consistent across upper locations. Irregular tachycardia. No gallops or rubs appreciated. Respiratory: CTAB all lung fields, good air movement. No accessory muscle use. Gastrointestinal: NTTP, Non distended. BS normal  MSK: MSK 5/5 all extremities Derm: Warm, dry.   Neuro: AxO x 4, patient is a good historian.  Neuro: CN2: no vision changes CN3,4,6: PERRLA. EOMI CN5: Sensation intact BL CN7: Facial expressions symmetric CN8: Hearing intact BL CN9: palate symmetric  CN10: regular speech CN11: turns head against resistance CN12: tongue midline Psych: Mood: "anxious". Affect: congruent. Pt is casually dressed. No acute concerns from a psych standpoint.  Labs:  CBC BMET  Recent Labs  Lab 01/12/23 1530  WBC 9.2  HGB 7.0*  HCT 24.4*  PLT 342   Recent Labs  Lab 01/12/23 1530  NA 139  K 3.7  CL 104  CO2 25  BUN 23  CREATININE 1.02*  GLUCOSE 110*  CALCIUM 8.8*    Pertinent additional labs    Latest Reference Range & Units 11/18/22 11:14 01/12/23  15:30  Hemoglobin 12.0 - 15.0 g/dL 16.1 (L) 7.0 (L)  Draw is pre-transfusion.   Latest Reference Range & Units 01/12/23 18:25 01/12/23 19:56  Lactic Acid, Venous 0.5 - 1.9 mmol/L 0.8 0.7   .  EKG:  Tachycardia, PVCs, right bundle branch block. QTC of 436   Imaging Studies Performed:  Imaging Study (ie. Chest x-ray) CT Head w/o Contrast - 9/24 IMPRESSION: 1. 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 2. Atrophy and mild chronic small vessel ischemic changes of the white matter.   DG Chest X ray 9/24 1-view: IMPRESSION: Low lung volumes with cardiomegaly and central congestion.   Margaretmary Dys, MD 01/12/2023, 9:45 PM PGY-1, [facility]  FPTS Intern pager: [phone], text pages welcome Secure chat group [facility] Teaching Service

## 2023-01-12 NOTE — ED Notes (Signed)
Admitting provider aware that pt requesting medication for claustrophobia for MRI Orders placed and MRI aware

## 2023-01-12 NOTE — ED Notes (Signed)
Pt denies any adverse symptoms/concerns at this time. Blood administration continues at 131mL/hr. Pt and husband educated on transfusion reactions and advised to let staff know if they have any concerns. Verbal understanding expressed.

## 2023-01-12 NOTE — ED Notes (Signed)
This RN remains at bedside for initial blood transfusion.

## 2023-01-12 NOTE — H&P (Incomplete)
Hospital Admission History and Physical Service Pager: [phone]  Patient name: Megan Espinoza Medical record number: [id number] Date of Birth: Oct 25, 1955 Age: 67 y.o. Gender: female  Primary Care Provider: Ellyn Hack, MD Consultants: GI  Code Status: Full Code Preferred Emergency Contact:  Contact Information     Name Relation Home Work Mobile   [place] Significant other [phone]        Other Contacts   None on File      Chief Complaint: Symptomatic Anemia  Assessment and Plan: Megan Espinoza is a 67 y.o. female presenting with symptomatic anemia, GI bleed and urinary tract infection. Differential for the GI bleed includes diverticular disease, peptic/duodenal ulcer, internal hemorrhoids. Cannot rule out occult malignancy.   Assessment & Plan Symptomatic anemia Pt initially presented from home with difficulty with words that improved over the course of the day.   She also noted feeling weak/tired for last week. On admission her vitals were stable, hemoglobin was 7, fecal occult blood was positive, although she reports no blood in stools. Anemia is likely secondary to a GI bleed, as MCV normal, and patient last Hgb 11.6 1 month ago. Patient reports recently starting Xarelto for A. Fib, about a month ago. Patient was transfused 1 unit PRBC for symptomatic anemia.  - Admit to FPTS, attending Dr. Lum Babe - Insert 2 large bore IV's - Hgb check at 230 am (4 hours post transfusion) - GI consulted, will come see her 09/25. - Pt NPO after midnight - BMP, CBC AM labs   GI Bleed Pt w/ symptomatic anemia, Hgb 7, w/ positive FOBT. Patient reports no history of dark, tarry stools, no bright red blood in stools. No history of bleeding she is aware of. Chart review does not provide a history of endoscopy or colonoscopy. -Transfuse 1 unit PRBC -2 Large bore IV -NPO at midnight -GI following, appreciate recs Acute cystitis without hematuria Pt was found to  have a urinary tract infection (large nitrites, culture pending) on admission. Pt states that she has not had dysuria, but always feels increased urgency to urinate. No history of hematuria. She was treated for UTI in the ED, but given her lack of symptoms, may consider not treating for asymptomatic bacteriuria.  - Pt's last UTI was 10/2022  - Urine Culture Pending, drawn 9/24 - 1 Dose ceftriaxone given in ED, will hold off additional doses until sensitivities/specificities come back    Chronic and Stable Problems:  Atrial Fibrillation: XARELTO 20 MG tablet - HELD for GI Bleed, metoprolol XR 25 MG  Hypertension: amLODipine10 MG tablet, hydrochlorothiazide 12.5 mg COPD: Albuterol Q4 PRN  FEN/GI: PPI, Regular Diet VTE Prophylaxis: SCDs (active bleed)  Disposition: Admitted to Progressive care unit  History of Present Illness:  Megan Espinoza is a 67 y.o. female presenting with dizziness and weakness.   Dropped her granddaughter off at kindergarten, and felt the music was sounding weird. Then went home to do some work, and started having a hard time typing (typing the wrong words than she intended). Notes she fell earlier today while walking, she her foot got caught under the chair. She fell on her bottom, got up from the fall and was okay, and went home. Later that day, she felt bad and decided to call her PCP who recommended she go to ED, because of her A. Fib. Patient notes she began not feeling well/tired, over the weekend. Patient note's that she was recently started on Xarelto for A.Fib, nearly  a month ago, and has had symptoms start following starting the medication.   Denies any CP, but has SOB at baseline 2/2 her lung scarring. Denies any dizziness, nausea, vomiting.   UTI, had one in July, denies any dysuria or increased frequency. Took 5 days of abx the last time this happened. Always has a constant urgency to go, since she's had kids. Denies any back pain.   In the ED, patient  was noted to have stable vitals, w/ Hgb < 7. She was transfused 1 unit PRBC  Review Of Systems: Per HPI with the following additions:   Pertinent Past Medical History: H1N1 Intubated causing lung scarring (remote) A. Fib HTN Constipation Prediabetes Macular Degeneration COPD Hrt Murmur  Remainder reviewed in history tab.   Pertinent Past Surgical History: Hernia Repair (Umbilical) - 30 yrs ago Lap Chole - 25 yrs ago Cataracts Bilat - last year Remainder reviewed in history tab.   Pertinent Social History: Tobacco use: No Alcohol use: A month ago, a glass of wine with dinner 1 x week Other Substance use: None Lives with Husband, grand granddaughter Megan Espinoza 4 yo  Pertinent Family History: Brain Cancer - Mom and Dad  Remainder reviewed in history tab.   Important Outpatient Medications: acetaminophen (TYLENOL) 325 MG tablet albuterol (VENTOLIN HFA) 108 (90 Base) MCG/ACT inhaler amLODipine (NORVASC) 10 MG tablet cyclobenzaprine (FLEXERIL) 10 MG tablet hydrochlorothiazide (HYDRODIURIL) 25 MG tablet (12.5 mg) metoprolol succinate (TOPROL-XL) 25 MG 24 hr tablet naproxen sodium (ALEVE) 220 MG tablet 480 mg BID omeprazole (PRILOSEC) 20 MG capsule sodium chloride flush (NS) 0.9 % injection 3 mL XARELTO 20 MG TABS tablet, last took last night   Remainder reviewed in medication history.   Objective: BP (!) 138/59   Pulse 94   Temp 98.2 F (36.8 C) (Oral)   Resp 20   Ht 5\' 6"  (1.676 m)   Wt 133.8 kg   SpO2 99%   BMI 47.61 kg/m  Exam: General: NAD, Pleasant, conversant woman who appears her stated age. Eyes: PERRL, EOMI. Non-icteric sclera. Peripheral vision approximately normal ENTM: MMM Neck: Soft, supple Cardiovascular: Holosystolic murmur present, consistent across upper locations. Irregular tachycardia. No gallops or rubs appreciated. Respiratory: CTAB all lung fields, good air movement. No accessory muscle use. Gastrointestinal: NTTP, Non distended. BS  normal  MSK: MSK 5/5 all extremities Derm: Warm, dry.   Neuro: AxO x 4, patient is a good historian.  Neuro: CN2: no vision changes CN3,4,6: PERRLA. EOMI CN5: Sensation intact BL CN7: Facial expressions symmetric CN8: Hearing intact BL CN9: palate symmetric  CN10: regular speech CN11: turns head against resistance CN12: tongue midline Psych: Mood: "anxious". Affect: congruent. Pt is casually dressed. No acute concerns from a psych standpoint.  Labs:  CBC BMET  Recent Labs  Lab 01/12/23 1530  WBC 9.2  HGB 7.0*  HCT 24.4*  PLT 342   Recent Labs  Lab 01/12/23 1530  NA 139  K 3.7  CL 104  CO2 25  BUN 23  CREATININE 1.02*  GLUCOSE 110*  CALCIUM 8.8*    Pertinent additional labs    Latest Reference Range & Units 11/18/22 11:14 01/12/23 15:30  Hemoglobin 12.0 - 15.0 g/dL 60.4 (L) 7.0 (L)  Draw is pre-transfusion.   Latest Reference Range & Units 01/12/23 18:25 01/12/23 19:56  Lactic Acid, Venous 0.5 - 1.9 mmol/L 0.8 0.7   .  EKG:  Tachycardia, PVCs, right bundle branch block. QTC of 436   Imaging Studies Performed:  Imaging Study (ie.  Chest x-ray) CT Head w/o Contrast - 9/24 IMPRESSION: 1. 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 2. Atrophy and mild chronic small vessel ischemic changes of the white matter.   DG Chest X ray 9/24 1-view: IMPRESSION: Low lung volumes with cardiomegaly and central congestion.   Margaretmary Dys, MD 01/12/2023, 9:45 PM PGY-1, [facility]  FPTS Intern pager: [phone], text pages welcome Secure chat group [facility] Teaching Service

## 2023-01-12 NOTE — ED Provider Notes (Signed)
[place] EMERGENCY DEPARTMENT AT [facility] Provider Note   CSN: [id number] Arrival date & time: 01/12/23  1450     History  Chief Complaint  Patient presents with   Aphasia    Megan Espinoza is a 67 y.o. female.  Pt is a 67 yo female with pmhx significant for afib and htn.  Pt said she noticed some slurred speech this morning at 0730 when she was driving her granddaughter to school.  She noticed that she was typing the wrong words around 1000.  She had no other sx and feels back to normal now.  She does mention that she's felt dizzy intermittently since starting the bb for her afib.        Home Medications Prior to Admission medications   Medication Sig Start Date End Date Taking? Authorizing Provider  acetaminophen (TYLENOL) 325 MG tablet Take 2 tablets (650 mg total) by mouth every 6 (six) hours as needed (or Fever >/= 101). 04/14/12  Yes Simonne Martinet, NP  albuterol (VENTOLIN HFA) 108 (90 Base) MCG/ACT inhaler Inhale 2 puffs into the lungs every 4 (four) hours as needed for wheezing or shortness of breath. 07/19/19  Yes Mickie Bail, NP  amLODipine (NORVASC) 10 MG tablet Take 10 mg by mouth daily. 06/21/21  Yes [provider]  cyclobenzaprine (FLEXERIL) 10 MG tablet Take 10 mg by mouth at bedtime. 12/31/22  Yes [provider]  hydrochlorothiazide (HYDRODIURIL) 25 MG tablet Take 12.5 mg by mouth daily. 06/16/21  Yes [provider]  metoprolol succinate (TOPROL-XL) 25 MG 24 hr tablet Take 25 mg by mouth daily. 12/03/22  Yes [provider]  naproxen sodium (ALEVE) 220 MG tablet Take 220 mg by mouth in the morning and at bedtime.   Yes [provider]  omeprazole (PRILOSEC) 20 MG capsule Take 20 mg by mouth daily.   Yes [provider]  XARELTO 20 MG TABS tablet Take 20 mg by mouth daily. 12/03/22  Yes [provider]  phentermine (ADIPEX-P) 37.5 MG tablet Take 37.5 mg by mouth daily. Patient not  taking: Reported on 01/12/2023 11/02/22   [provider]      Allergies    Penicillins, Codeine, Erythromycin, and Sulfa antibiotics    Review of Systems   Review of Systems  Neurological:  Positive for speech difficulty.  All other systems reviewed and are negative.   Physical Exam Updated Vital Signs BP (!) 144/63   Pulse 95   Temp 98 F (36.7 C) (Oral)   Resp 18   Ht 5\' 6"  (1.676 m)   Wt 133.8 kg   SpO2 100%   BMI 47.61 kg/m  Physical Exam Vitals and nursing note reviewed.  Constitutional:      Appearance: Normal appearance. She is obese.  HENT:     Head: Normocephalic and atraumatic.     Right Ear: External ear normal.     Left Ear: External ear normal.     Nose: Nose normal.     Mouth/Throat:     Mouth: Mucous membranes are moist.     Pharynx: Oropharynx is clear.  Eyes:     Extraocular Movements: Extraocular movements intact.     Conjunctiva/sclera: Conjunctivae normal.     Pupils: Pupils are equal, round, and reactive to light.  Cardiovascular:     Rate and Rhythm: Tachycardia present. Rhythm irregular.     Pulses: Normal pulses.     Heart sounds: Normal heart sounds.  Pulmonary:  Effort: Pulmonary effort is normal.     Breath sounds: Normal breath sounds.  Abdominal:     General: Abdomen is flat. Bowel sounds are normal.     Palpations: Abdomen is soft.  Genitourinary:    Rectum: Guaiac result positive.  Musculoskeletal:        General: Normal range of motion.     Cervical back: Normal range of motion and neck supple.  Skin:    General: Skin is warm.     Capillary Refill: Capillary refill takes less than 2 seconds.  Neurological:     General: No focal deficit present.     Mental Status: She is alert and oriented to person, place, and time.  Psychiatric:        Mood and Affect: Mood normal.        Behavior: Behavior normal.        Thought Content: Thought content normal.        Judgment: Judgment normal.     ED Results /  Procedures / Treatments   Labs (all labs ordered are listed, but only abnormal results are displayed) Labs Reviewed  PROTIME-INR - Abnormal; Notable for the following components:      Result Value   Prothrombin Time 15.4 (*)    All other components within normal limits  CBC - Abnormal; Notable for the following components:   RBC 2.96 (*)    Hemoglobin 7.0 (*)    HCT 24.4 (*)    MCH 23.6 (*)    MCHC 28.7 (*)    RDW 15.7 (*)    All other components within normal limits  COMPREHENSIVE METABOLIC PANEL - Abnormal; Notable for the following components:   Glucose, Bld 110 (*)    Creatinine, Ser 1.02 (*)    Calcium 8.8 (*)    Total Protein 6.4 (*)    Albumin 2.8 (*)    All other components within normal limits  URINALYSIS, ROUTINE W REFLEX MICROSCOPIC - Abnormal; Notable for the following components:   APPearance CLOUDY (*)    Hgb urine dipstick SMALL (*)    Protein, ur 30 (*)    Nitrite POSITIVE (*)    Leukocytes,Ua LARGE (*)    Bacteria, UA MANY (*)    All other components within normal limits  POC OCCULT BLOOD, ED - Abnormal; Notable for the following components:   Fecal Occult Bld POSITIVE (*)    All other components within normal limits  TROPONIN I (HIGH SENSITIVITY) - Abnormal; Notable for the following components:   Troponin I (High Sensitivity) 27 (*)    All other components within normal limits  CULTURE, BLOOD (ROUTINE X 2)  CULTURE, BLOOD (ROUTINE X 2)  URINE CULTURE  APTT  DIFFERENTIAL  ETHANOL  CBG MONITORING, ED  I-STAT CG4 LACTIC ACID, ED  I-STAT CG4 LACTIC ACID, ED  TYPE AND SCREEN  PREPARE RBC (CROSSMATCH)  TROPONIN I (HIGH SENSITIVITY)    EKG EKG Interpretation Date/Time:  Tuesday January 12 2023 14:42:53 EDT Ventricular Rate:  112 PR Interval:  136 QRS Duration:  112 QT Interval:  320 QTC Calculation: 436 R Axis:   9  Text Interpretation: Sinus tachycardia with frequent Premature ventricular complexes Incomplete right bundle branch block Marked  ST abnormality, possible inferolateral subendocardial injury Abnormal ECG When compared with ECG of 18-Nov-2022 11:06, PREVIOUS ECG IS PRESENT PVCs are new Confirmed by Jacalyn Lefevre [phone]) on 01/12/2023 5:19:16 PM  Radiology CT HEAD WO CONTRAST  Result Date: 01/12/2023 CLINICAL DATA:  Slurred speech EXAM:  CT HEAD WITHOUT CONTRAST TECHNIQUE: Contiguous axial images were obtained from the base of the skull through the vertex without intravenous contrast. RADIATION DOSE REDUCTION: This exam was performed according to the departmental dose-optimization program which includes automated exposure control, adjustment of the mA and/or kV according to patient size and/or use of iterative reconstruction technique. COMPARISON:  None Available. FINDINGS: Brain: No territorial infarction, hemorrhage, or intracranial mass. Small age indeterminate lacunar infarct within the left periventricular white matter. Atrophy. Mild chronic small vessel ischemic changes of the white matter. Nonenlarged ventricles Vascular: No hyperdense vessels.  Carotid vascular calcification Skull: Normal. Negative for fracture or focal lesion. Sinuses/Orbits: No acute finding. Other: None IMPRESSION: 1. 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 2. Atrophy and mild chronic small vessel ischemic changes of the white matter. Electronically Signed   By: Jasmine Pang M.D.   On: 01/12/2023 19:39   DG Chest Portable 1 View  Result Date: 01/12/2023 CLINICAL DATA:  Chest pain EXAM: PORTABLE CHEST 1 VIEW COMPARISON:  11/18/2022 FINDINGS: Low lung volumes. Cardiomegaly with central congestion. No acute airspace disease, pleural effusion, or pneumothorax. Patchy atelectasis left base. IMPRESSION: Low lung volumes with cardiomegaly and central congestion. Electronically Signed   By: Jasmine Pang M.D.   On: 01/12/2023 19:32    Procedures Procedures    Medications Ordered in ED Medications  sodium chloride flush  (NS) 0.9 % injection 3 mL (3 mLs Intravenous Not Given 01/12/23 1707)  0.9 %  sodium chloride infusion (Manually program via Guardrails IV Fluids) (0 mLs Intravenous Hold 01/12/23 2003)  pantoprazole (PROTONIX) 80 mg /NS 100 mL IVPB (has no administration in time range)  pantoprozole (PROTONIX) 80 mg /NS 100 mL infusion (has no administration in time range)  cefTRIAXone (ROCEPHIN) 1 g in sodium chloride 0.9 % 100 mL IVPB (0 g Intravenous Stopped 01/12/23 1921)    ED Course/ Medical Decision Making/ A&P                                 Medical Decision Making Amount and/or Complexity of Data Reviewed Labs: ordered. Radiology: ordered.  Risk Prescription drug management. Decision regarding hospitalization.   This patient presents to the ED for concern of speech problems, this involves an extensive number of treatment options, and is a complaint that carries with it a high risk of complications and morbidity.  The differential diagnosis includes cva, tia, electrolyte abn   Co morbidities that complicate the patient evaluation  afib and htn   Additional history obtained:  Additional history obtained from epic chart review External records from outside source obtained and reviewed including husband   Lab Tests:  I Ordered, and personally interpreted labs.  The pertinent results include:  cbc with hgb down to 7.0 (hgb 11.6 in July), cmp nl, ua +, inr 1.2, etoh neg   Imaging Studies ordered:  I ordered imaging studies including ct head and cxr I independently visualized and interpreted imaging which showed  CT head: Age indeterminate hypodensity/lacunar infarct within the left  white matter.  2. Atrophy and mild chronic small vessel ischemic changes of the  white matter.  CXR: Low lung volumes with cardiomegaly and central congestion.  I agree with the radiologist interpretation   Cardiac Monitoring:  The patient was maintained on a cardiac monitor.  I personally viewed  and interpreted the cardiac monitored which showed an underlying rhythm of: afib   Medicines ordered and  prescription drug management:  I ordered medication including transfusion  for symptomatic anemia  Reevaluation of the patient after these medicines showed that the patient improved I have reviewed the patients home medicines and have made adjustments as needed   Test Considered:  ct   Critical Interventions:  transfusion   Consultations Obtained:  I requested consultation with the FP residents,  and discussed lab and imaging findings as well as pertinent plan - they will admit   Problem List / ED Course:  Symptomatic anemia:  1 unit prbc ordered for transfusion.  Pt is guaiac + and has never seen GI.  Dr. Lavon Paganini messaged via secure chat for consult in am.  We will hold her blood thinners. Protonix started. UTI:  rocephin started Slurred speech:  likely due to anemia, but MRI added on.  CT showed an age indeterminate infarct.   Reevaluation:  After the interventions noted above, I reevaluated the patient and found that they have :improved   Social Determinants of Health:  Lives at home   Dispostion:  After consideration of the diagnostic results and the patients response to treatment, I feel that the patent would benefit from admission.    CRITICAL CARE Performed by: Jacalyn Lefevre   Total critical care time: 30 minutes  Critical care time was exclusive of separately billable procedures and treating other patients.  Critical care was necessary to treat or prevent imminent or life-threatening deterioration.  Critical care was time spent personally by me on the following activities: development of treatment plan with patient and/or surrogate as well as nursing, discussions with consultants, evaluation of patient's response to treatment, examination of patient, obtaining history from patient or surrogate, ordering and performing treatments and interventions,  ordering and review of laboratory studies, ordering and review of radiographic studies, pulse oximetry and re-evaluation of patient's condition.         Final Clinical Impression(s) / ED Diagnoses Final diagnoses:  Symptomatic anemia  Acute cystitis without hematuria    Rx / DC Orders ED Discharge Orders     None         Jacalyn Lefevre, MD 01/12/23 2007

## 2023-01-12 NOTE — ED Triage Notes (Addendum)
Pt c/o slurred speech when dropping off granddaughter to school at 0730 this morning. Pt's LKW [id number]. Pt states at work she was typing the wrong things at 1000 today

## 2023-01-13 ENCOUNTER — Inpatient Hospital Stay (HOSPITAL_COMMUNITY): Payer: Medicare HMO

## 2023-01-13 ENCOUNTER — Encounter (HOSPITAL_COMMUNITY): Payer: Self-pay

## 2023-01-13 DIAGNOSIS — I639 Cerebral infarction, unspecified: Secondary | ICD-10-CM

## 2023-01-13 DIAGNOSIS — R8271 Bacteriuria: Secondary | ICD-10-CM | POA: Diagnosis present

## 2023-01-13 DIAGNOSIS — D509 Iron deficiency anemia, unspecified: Secondary | ICD-10-CM

## 2023-01-13 DIAGNOSIS — D649 Anemia, unspecified: Secondary | ICD-10-CM | POA: Diagnosis not present

## 2023-01-13 DIAGNOSIS — K922 Gastrointestinal hemorrhage, unspecified: Secondary | ICD-10-CM | POA: Diagnosis not present

## 2023-01-13 HISTORY — DX: Cerebral infarction, unspecified: I63.9

## 2023-01-13 LAB — VITAMIN B12: Vitamin B-12: 198 pg/mL (ref 180–914)

## 2023-01-13 LAB — FOLATE: Folate: 10.8 ng/mL (ref >5.9)

## 2023-01-13 LAB — CBC
HCT: 26.2 % — ABNORMAL LOW (ref 36.0–46.0)
Hemoglobin: 7.6 g/dL — ABNORMAL LOW (ref 12.0–15.0)
MCH: 23.8 pg — ABNORMAL LOW (ref 26.0–34.0)
MCHC: 29 g/dL — ABNORMAL LOW (ref 30.0–36.0)
MCV: 82.1 fL (ref 80.0–100.0)
Platelets: 323 10*3/uL (ref 150–400)
RBC: 3.19 MIL/uL — ABNORMAL LOW (ref 3.87–5.11)
RDW: 15.9 % — ABNORMAL HIGH (ref 11.5–15.5)
WBC: 7.9 10*3/uL (ref 4.0–10.5)
nRBC: 0 % (ref 0.0–0.2)

## 2023-01-13 LAB — I-STAT CHEM 8, ED
BUN: 24 mg/dL — ABNORMAL HIGH (ref 8–23)
Calcium, Ion: 0.96 mmol/L — ABNORMAL LOW (ref 1.15–1.40)
Chloride: 108 mmol/L (ref 98–111)
Creatinine, Ser: 1 mg/dL (ref 0.44–1.00)
Glucose, Bld: 111 mg/dL — ABNORMAL HIGH (ref 70–99)
HCT: 22 % — ABNORMAL LOW (ref 36.0–46.0)
Hemoglobin: 7.5 g/dL — ABNORMAL LOW (ref 12.0–15.0)
Potassium: 3.8 mmol/L (ref 3.5–5.1)
Sodium: 140 mmol/L (ref 135–145)
TCO2: 23 mmol/L (ref 22–32)

## 2023-01-13 LAB — BASIC METABOLIC PANEL
Anion gap: 8 (ref 5–15)
BUN: 21 mg/dL (ref 8–23)
CO2: 26 mmol/L (ref 22–32)
Calcium: 8.8 mg/dL — ABNORMAL LOW (ref 8.9–10.3)
Chloride: 106 mmol/L (ref 98–111)
Creatinine, Ser: 1.01 mg/dL — ABNORMAL HIGH (ref 0.44–1.00)
GFR, Estimated: >60 mL/min (ref >60)
Glucose, Bld: 127 mg/dL — ABNORMAL HIGH (ref 70–99)
Potassium: 3.5 mmol/L (ref 3.5–5.1)
Sodium: 140 mmol/L (ref 135–145)

## 2023-01-13 LAB — HIV ANTIBODY (ROUTINE TESTING W REFLEX): HIV Screen 4th Generation wRfx: NONREACTIVE

## 2023-01-13 LAB — IRON AND TIBC
Iron: 9 ug/dL — ABNORMAL LOW (ref 28–170)
Saturation Ratios: 2 % — ABNORMAL LOW (ref 10.4–31.8)
TIBC: 424 ug/dL (ref 250–450)
UIBC: 415 ug/dL

## 2023-01-13 LAB — LIPID PANEL
Cholesterol: 134 mg/dL (ref 0–200)
HDL: 35 mg/dL — ABNORMAL LOW (ref >40)
LDL Cholesterol: 78 mg/dL (ref 0–99)
Total CHOL/HDL Ratio: 3.8 RATIO
Triglycerides: 106 mg/dL (ref <150)
VLDL: 21 mg/dL (ref 0–40)

## 2023-01-13 LAB — PREPARE RBC (CROSSMATCH)

## 2023-01-13 LAB — HEMOGLOBIN A1C
Hgb A1c MFr Bld: 5.1 % (ref 4.8–5.6)
Mean Plasma Glucose: 99.67 mg/dL

## 2023-01-13 LAB — TSH: TSH: 5.761 u[IU]/mL — ABNORMAL HIGH (ref 0.350–4.500)

## 2023-01-13 LAB — HEMOGLOBIN AND HEMATOCRIT, BLOOD
HCT: 28.1 % — ABNORMAL LOW (ref 36.0–46.0)
Hemoglobin: 8.4 g/dL — ABNORMAL LOW (ref 12.0–15.0)

## 2023-01-13 LAB — FERRITIN: Ferritin: 9 ng/mL — ABNORMAL LOW (ref 11–307)

## 2023-01-13 LAB — RPR: RPR Ser Ql: NONREACTIVE

## 2023-01-13 LAB — HEMOGLOBIN: Hemoglobin: 7.5 g/dL — ABNORMAL LOW (ref 12.0–15.0)

## 2023-01-13 MED ORDER — ASPIRIN 81 MG PO TBEC: 81.0000 mg | DELAYED_RELEASE_TABLET | Freq: Every day | ORAL | Status: DC

## 2023-01-13 MED ORDER — ATORVASTATIN CALCIUM 40 MG PO TABS
40.0000 mg | ORAL_TABLET | Freq: Every day | ORAL | Status: DC
  Administered 2023-01-13 – 2023-01-15 (×2): 40 mg via ORAL
  Filled 2023-01-13 (×2): qty 1

## 2023-01-13 MED ORDER — ONDANSETRON HCL 4 MG/2ML IJ SOLN
4.0000 mg | Freq: Once | INTRAMUSCULAR | Status: AC | PRN
  Administered 2023-01-13: 4 mg via INTRAVENOUS
  Filled 2023-01-13: qty 2

## 2023-01-13 MED ORDER — IOHEXOL 350 MG/ML SOLN
75.0000 mL | Freq: Once | INTRAVENOUS | Status: AC | PRN
  Administered 2023-01-13: 75 mL via INTRAVENOUS

## 2023-01-13 MED ORDER — PANTOPRAZOLE INFUSION (NEW) - SIMPLE MED: 8.0000 mg/h | INTRAVENOUS | Status: AC

## 2023-01-13 MED ORDER — SODIUM CHLORIDE 0.9% IV SOLUTION: Freq: Once | INTRAVENOUS | Status: AC

## 2023-01-13 MED ORDER — ASPIRIN 81 MG PO TBEC
81.0000 mg | DELAYED_RELEASE_TABLET | Freq: Every day | ORAL | Status: DC
  Administered 2023-01-13 – 2023-01-15 (×2): 81 mg via ORAL
  Filled 2023-01-13 (×2): qty 1

## 2023-01-13 MED ORDER — PEG-KCL-NACL-NASULF-NA ASC-C 100 G PO SOLR
0.5000 | Freq: Once | ORAL | Status: AC
  Administered 2023-01-13: 100 g via ORAL

## 2023-01-13 MED ORDER — PEG-KCL-NACL-NASULF-NA ASC-C 100 G PO SOLR
1.0000 | Freq: Once | ORAL | Status: DC
Start: 2023-01-13 — End: 2023-01-13

## 2023-01-13 MED ORDER — PEG-KCL-NACL-NASULF-NA ASC-C 100 G PO SOLR
0.5000 | Freq: Once | ORAL | Status: AC
  Administered 2023-01-13: 100 g via ORAL
  Filled 2023-01-13: qty 1

## 2023-01-13 MED ORDER — PANTOPRAZOLE SODIUM 40 MG IV SOLR
40.0000 mg | Freq: Two times a day (BID) | INTRAVENOUS | Status: DC
  Administered 2023-01-13 – 2023-01-15 (×4): 40 mg via INTRAVENOUS
  Filled 2023-01-13 (×4): qty 10

## 2023-01-13 NOTE — Assessment & Plan Note (Addendum)
Patient noted that today she started having some confusion and difficulty expressing her self. She noted that it improved on it's own. Patient arrived in ED w/ concern for CVA, w/ no acute finding on CT Head. Patient had normal neurological exam, on admission and reports she's feeling better/back to baseline, after receiving her blood transfusion. MRI later showed small ischemic stroke, neurology was consulted. Neuro noted a small stroke w/ little to do, but recommended starting ASA, for now, and Xarelto when GI gave the okay. Neurology felt that confusion/aphasia likely coming from anemia, as stroke is on wrong side for symptoms.  - Admit to FPTS, attending Dr. Lum Babe - Held patient's blood pressure medications for permissive HTN - Neurology consulted, appreciate their recs. - ASA 81 - Risk start labs (TSH, Lipid, A1c, B12, RPR, HIV) - Echocardiogram - PT/OT

## 2023-01-13 NOTE — Assessment & Plan Note (Deleted)
Patient noted that today she started having some confusion and difficulty expressing her self. She noted that it improved on it's own. Patient arrived in ED w/ concern for CVA, w/ no acute finding on CT Head, MRI pending. Patient had normal neurological exam, on admission and reports she's feeling better/back to baseline, after receiving her blood transfusion. Given no finding's on imaging, and low Hgb, as well as improvement and normal Neuro exam, we felt that her symptoms were likely coming from her anemia. Patient w/ no hx of DM, HLD, Thyroid dysfunction, but does have HTN, make CVA less likely.  -CTM -F/u MRI

## 2023-01-13 NOTE — Progress Notes (Signed)
Attempted to perform 2D echocardiogram, but patient was unavailable as they are now inpatient and heading to their room. RN cancelled transport. Will re-attempt 01/14/23.

## 2023-01-13 NOTE — ED Notes (Signed)
ED TO INPATIENT HANDOFF REPORT  ED Nurse Name and Phone #: [phone]  S Name/Age/Gender Megan Espinoza 67 y.o. female Room/Bed: 003C/003C  Code Status   Code Status: Full Code  Home/SNF/Other Home Patient oriented to: self, place, time, and situation Is this baseline? Yes   Triage Complete: Triage complete  Chief Complaint GI bleed [K92.2]  Triage Note Pt c/o slurred speech when dropping off granddaughter to school at 0730 this morning. Pt's LKW [id number]. Pt states at work she was typing the wrong things at 1000 today   Allergies Allergies  Allergen Reactions   Penicillins Swelling   Codeine Nausea And Vomiting   Erythromycin     Mouth thrush- per pt   Sulfa Antibiotics     Mouth thrush- per pt    Level of Care/Admitting Diagnosis ED Disposition     ED Disposition  Admit   Condition  --   Comment  Hospital Area: [facility] [[id number]]  Level of Care: Progressive [102]  Admit to Progressive based on following criteria: GI, ENDOCRINE disease patients with GI bleeding, acute liver failure or pancreatitis, stable with diabetic ketoacidosis or thyrotoxicosis (hypothyroid) state.  May admit patient to [facility] or [facility] if equivalent level of care is available:: No  Covid Evaluation: Asymptomatic - no recent exposure (last 10 days) testing not required  Diagnosis: GI bleed [248157]  Admitting Physician: Margaretmary Dys [[id number]]  Attending Physician: Doreene Eland [[id number]]  Certification:: I certify this patient will need inpatient services for at least 2 midnights  Expected Medical Readiness: 01/14/2023          B Medical/Surgery History Past Medical History:  Diagnosis Date   Influenza, pneumonia December 2013   Associated with ARDS, Septic shock   Obesity (BMI 30.0-34.9)    Past Surgical History:  Procedure Laterality Date   CHOLECYSTECTOMY     HERNIA REPAIR       A IV Location/Drains/Wounds Patient  Lines/Drains/Airways Status     Active Line/Drains/Airways     Name Placement date Placement time Site Days   Peripheral IV 01/12/23 20 G Anterior;Distal;Right Forearm 01/12/23  1812  Forearm  1   Peripheral IV 01/12/23 20 G Anterior;Right;Upper Arm 01/12/23  1813  Arm  1   Wound 04/06/12 Other (Comment) Thigh Erythematous  04/06/12  0828  Thigh  [id number]            Intake/Output Last 24 hours  Intake/Output Summary (Last 24 hours) at 01/13/2023 1645 Last data filed at 01/12/2023 2232 Gross per 24 hour  Intake 415 ml  Output --  Net 415 ml    Labs/Imaging Results for orders placed or performed during the hospital encounter of 01/12/23 (from the past 48 hour(s))  Type and screen [facility]     Status: None (Preliminary result)   Collection Time: 01/12/23  8:08 AM  Result Value Ref Range   ABO/RH(D) O POS    Antibody Screen NEG    Sample Expiration 01/15/2023,2359    Unit Number [id number]    Blood Component Type RED CELLS,LR    Unit division 00    Status of Unit ISSUED,FINAL    Transfusion Status OK TO TRANSFUSE    Crossmatch Result      Compatible Performed at [facility], [address], [place], [place] [address]    Unit Number [id number]    Blood Component Type RED CELLS,LR    Unit division 00    Status  of Unit ISSUED    Transfusion Status OK TO TRANSFUSE    Crossmatch Result Compatible   Protime-INR     Status: Abnormal   Collection Time: 01/12/23  3:30 PM  Result Value Ref Range   Prothrombin Time 15.4 (H) 11.4 - 15.2 seconds   INR 1.2 0.8 - 1.2    Comment: (NOTE) INR goal varies based on device and disease states. Performed at [facility], [address], [place], [place] [address]   APTT     Status: None   Collection Time: 01/12/23  3:30 PM  Result Value Ref Range   aPTT 29 24 - 36 seconds    Comment: Performed at [facility], [address], [place], [place] [address]  CBC     Status: Abnormal   Collection  Time: 01/12/23  3:30 PM  Result Value Ref Range   WBC 9.2 4.0 - 10.5 K/uL   RBC 2.96 (L) 3.87 - 5.11 MIL/uL   Hemoglobin 7.0 (L) 12.0 - 15.0 g/dL   HCT 09.8 (L) 11.9 - 14.7 %   MCV 82.4 80.0 - 100.0 fL   MCH 23.6 (L) 26.0 - 34.0 pg   MCHC 28.7 (L) 30.0 - 36.0 g/dL   RDW 82.9 (H) 56.2 - 13.0 %   Platelets 342 150 - 400 K/uL   nRBC 0.0 0.0 - 0.2 %    Comment: Performed at [facility], [address] [place], [place] [address]  Differential     Status: None   Collection Time: 01/12/23  3:30 PM  Result Value Ref Range   Neutrophils Relative % 69 %   Neutro Abs 6.5 1.7 - 7.7 K/uL   Lymphocytes Relative 17 %   Lymphs Abs 1.6 0.7 - 4.0 K/uL   Monocytes Relative 8 %   Monocytes Absolute 0.7 0.1 - 1.0 K/uL   Eosinophils Relative 5 %   Eosinophils Absolute 0.4 0.0 - 0.5 K/uL   Basophils Relative 0 %   Basophils Absolute 0.0 0.0 - 0.1 K/uL   Immature Granulocytes 1 %   Abs Immature Granulocytes 0.06 0.00 - 0.07 K/uL    Comment: Performed at [facility], [address] [place], [place] [address]  Comprehensive metabolic panel     Status: Abnormal   Collection Time: 01/12/23  3:30 PM  Result Value Ref Range   Sodium 139 135 - 145 mmol/L   Potassium 3.7 3.5 - 5.1 mmol/L   Chloride 104 98 - 111 mmol/L   CO2 25 22 - 32 mmol/L   Glucose, Bld 110 (H) 70 - 99 mg/dL    Comment: Glucose reference range applies only to samples taken after fasting for at least 8 hours.   BUN 23 8 - 23 mg/dL   Creatinine, Ser 9.52 (H) 0.44 - 1.00 mg/dL   Calcium 8.8 (L) 8.9 - 10.3 mg/dL   Total Protein 6.4 (L) 6.5 - 8.1 g/dL   Albumin 2.8 (L) 3.5 - 5.0 g/dL   AST 18 15 - 41 U/L   ALT 14 0 - 44 U/L   Alkaline Phosphatase 96 38 - 126 U/L   Total Bilirubin 0.5 0.3 - 1.2 mg/dL   GFR, Estimated >84 >13 mL/min    Comment: (NOTE) Calculated using the CKD-EPI Creatinine Equation (2021)    Anion gap 10 5 - 15    Comment: Performed at [facility], [address], [place], [place] [address]   Ethanol  Status: None   Collection Time: 01/12/23  3:30 PM  Result Value Ref Range   Alcohol, Ethyl (B) <10 <10 mg/dL    Comment: (NOTE) Lowest detectable limit for serum alcohol is 10 mg/dL.  For medical purposes only. Performed at [facility], [address], [place], [place] [address]   I-stat chem 8, ED     Status: Abnormal   Collection Time: 01/12/23  3:45 PM  Result Value Ref Range   Sodium 140 135 - 145 mmol/L   Potassium 3.8 3.5 - 5.1 mmol/L   Chloride 108 98 - 111 mmol/L   BUN 24 (H) 8 - 23 mg/dL   Creatinine, Ser 1.91 0.44 - 1.00 mg/dL   Glucose, Bld 478 (H) 70 - 99 mg/dL    Comment: Glucose reference range applies only to samples taken after fasting for at least 8 hours.   Calcium, Ion 0.96 (L) 1.15 - 1.40 mmol/L   TCO2 23 22 - 32 mmol/L   Hemoglobin 7.5 (L) 12.0 - 15.0 g/dL   HCT 29.5 (L) 62.1 - 30.8 %  Urinalysis, Routine w reflex microscopic -Urine, Clean Catch     Status: Abnormal   Collection Time: 01/12/23  4:56 PM  Result Value Ref Range   Color, Urine YELLOW YELLOW   APPearance CLOUDY (A) CLEAR   Specific Gravity, Urine 1.016 1.005 - 1.030   pH 6.0 5.0 - 8.0   Glucose, UA NEGATIVE NEGATIVE mg/dL   Hgb urine dipstick SMALL (A) NEGATIVE   Bilirubin Urine NEGATIVE NEGATIVE   Ketones, ur NEGATIVE NEGATIVE mg/dL   Protein, ur 30 (A) NEGATIVE mg/dL   Nitrite POSITIVE (A) NEGATIVE   Leukocytes,Ua LARGE (A) NEGATIVE   RBC / HPF 0-5 0 - 5 RBC/hpf   WBC, UA >50 0 - 5 WBC/hpf   Bacteria, UA MANY (A) NONE SEEN   Squamous Epithelial / HPF 0-5 0 - 5 /HPF   WBC Clumps PRESENT    Mucus PRESENT     Comment: Performed at [facility], [address] [place], [place] [address]  Prepare RBC (crossmatch)     Status: None   Collection Time: 01/12/23  5:34 PM  Result Value Ref Range   Order Confirmation      ORDER PROCESSED BY BLOOD BANK Performed at [facility], [address], [place], [place] [address]   Troponin I (High Sensitivity)      Status: Abnormal   Collection Time: 01/12/23  5:35 PM  Result Value Ref Range   Troponin I (High Sensitivity) 27 (H) <18 ng/L    Comment: (NOTE) Elevated high sensitivity troponin I (hsTnI) values and significant  changes across serial measurements may suggest ACS but many other  chronic and acute conditions are known to elevate hsTnI results.  Refer to the "Links" section for chest pain algorithms and additional  guidance. Performed at [facility], [address] [place], [place] [address]   Culture, blood (routine x 2)     Status: None (Preliminary result)   Collection Time: 01/12/23  6:08 PM   Specimen: BLOOD RIGHT FOREARM  Result Value Ref Range   Specimen Description BLOOD RIGHT FOREARM    Special Requests      BOTTLES DRAWN AEROBIC AND ANAEROBIC Blood Culture results may not be optimal due to an excessive volume of blood received in culture bottles   Culture      NO GROWTH < 24 HOURS Performed at [facility], [address] [place], [place] [address]    Report Status PENDING   Culture, blood (routine x 2)     Status: None (Preliminary result)   Collection Time: 01/12/23  6:08 PM   Specimen: BLOOD  Result Value Ref Range   Specimen Description BLOOD RIGHT ANTECUBITAL    Special Requests      BLOOD Blood Culture results may not be optimal due to an excessive volume of blood received in culture bottles   Culture      NO GROWTH < 24 HOURS Performed at [facility], [address] [place], [place] [address]    Report Status PENDING   I-Stat Lactic Acid     Status: None   Collection Time: 01/12/23  6:25 PM  Result Value Ref Range   Lactic Acid, Venous 0.8 0.5 - 1.9 mmol/L  POC occult blood, ED Provider will collect     Status: Abnormal   Collection Time: 01/12/23  6:52 PM  Result Value Ref Range   Fecal Occult Bld POSITIVE (A) NEGATIVE  Troponin I (High Sensitivity)     Status: None   Collection Time: 01/12/23  7:35 PM  Result Value Ref Range    Troponin I (High Sensitivity) 6 <18 ng/L    Comment: (NOTE) Elevated high sensitivity troponin I (hsTnI) values and significant  changes across serial measurements may suggest ACS but many other  chronic and acute conditions are known to elevate hsTnI results.  Refer to the "Links" section for chest pain algorithms and additional  guidance. Performed at [facility], [address], [place], [place] [address]   I-Stat Lactic Acid     Status: None   Collection Time: 01/12/23  7:56 PM  Result Value Ref Range   Lactic Acid, Venous 0.7 0.5 - 1.9 mmol/L  Hemoglobin     Status: Abnormal   Collection Time: 01/13/23  3:38 AM  Result Value Ref Range   Hemoglobin 7.5 (L) 12.0 - 15.0 g/dL    Comment: Performed at [facility], [address], [place], [place] [address]  Basic metabolic panel     Status: Abnormal   Collection Time: 01/13/23  3:38 AM  Result Value Ref Range   Sodium 140 135 - 145 mmol/L   Potassium 3.5 3.5 - 5.1 mmol/L   Chloride 106 98 - 111 mmol/L   CO2 26 22 - 32 mmol/L   Glucose, Bld 127 (H) 70 - 99 mg/dL    Comment: Glucose reference range applies only to samples taken after fasting for at least 8 hours.   BUN 21 8 - 23 mg/dL   Creatinine, Ser 5.28 (H) 0.44 - 1.00 mg/dL   Calcium 8.8 (L) 8.9 - 10.3 mg/dL   GFR, Estimated >41 >32 mL/min    Comment: (NOTE) Calculated using the CKD-EPI Creatinine Equation (2021)    Anion gap 8 5 - 15    Comment: Performed at [facility], [address], [place], [place] [address]  CBC     Status: Abnormal   Collection Time: 01/13/23  3:38 AM  Result Value Ref Range   WBC 7.9 4.0 - 10.5 K/uL   RBC 3.19 (L) 3.87 - 5.11 MIL/uL   Hemoglobin 7.6 (L) 12.0 - 15.0 g/dL   HCT 27.2 (L) 53.6 - 64.4 %   MCV 82.1 80.0 - 100.0 fL   MCH 23.8 (L) 26.0 - 34.0 pg   MCHC 29.0 (L) 30.0 - 36.0 g/dL   RDW  15.9 (H) 11.5 - 15.5 %   Platelets 323 150 - 400 K/uL   nRBC 0.0 0.0 - 0.2 %    Comment: Performed at [facility],  [address], [place], [place] [address]  HIV Antibody (routine testing w rflx)     Status: None   Collection Time: 01/13/23  3:38 AM  Result Value Ref Range   HIV Screen 4th Generation wRfx Non Reactive Non Reactive    Comment: Performed at [facility], [address] [place], [place] [address]  Lipid panel     Status: Abnormal   Collection Time: 01/13/23  3:38 AM  Result Value Ref Range   Cholesterol 134 0 - 200 mg/dL   Triglycerides 283 <151 mg/dL   HDL 35 (L) >76 mg/dL   Total CHOL/HDL Ratio 3.8 RATIO   VLDL 21 0 - 40 mg/dL   LDL Cholesterol 78 0 - 99 mg/dL    Comment:        Total Cholesterol/HDL:CHD Risk Coronary Heart Disease Risk Table                     Men   Women  1/2 Average Risk   3.4   3.3  Average Risk       5.0   4.4  2 X Average Risk   9.6   7.1  3 X Average Risk  23.4   11.0        Use the calculated Patient Ratio above and the CHD Risk Table to determine the patient's CHD Risk.        ATP III CLASSIFICATION (LDL):  <100     mg/dL   Optimal  160-737  mg/dL   Near or Above                    Optimal  130-159  mg/dL   Borderline  106-269  mg/dL   High  >485     mg/dL   Very High Performed at [facility], [address], [place], [place] [address]   TSH     Status: Abnormal   Collection Time: 01/13/23  3:38 AM  Result Value Ref Range   TSH 5.761 (H) 0.350 - 4.500 uIU/mL    Comment: Performed by a 3rd Generation assay with a functional sensitivity of <=0.01 uIU/mL. Performed at [facility], [address], [place], [place] [address]   RPR     Status: None   Collection Time: 01/13/23  3:38 AM  Result Value Ref Range   RPR Ser Ql NON REACTIVE NON REACTIVE    Comment: Performed at [facility], [address] [place], [place] [address]  Vitamin B12     Status: None   Collection Time: 01/13/23  3:38 AM  Result Value Ref Range   Vitamin B-12 198 180 - 914 pg/mL    Comment: (NOTE) This assay is not validated for testing neonatal  or myeloproliferative syndrome specimens for Vitamin B12 levels. Performed at [facility], [address], [place], [place] [address]   Ferritin     Status: Abnormal   Collection Time: 01/13/23  3:38 AM  Result Value Ref Range   Ferritin 9 (L) 11 - 307 ng/mL    Comment: Performed at [facility], [address] [place], [place] [address]  Iron and TIBC     Status: Abnormal   Collection Time: 01/13/23  3:38  AM  Result Value Ref Range   Iron 9 (L) 28 - 170 ug/dL   TIBC 324 401 - 027 ug/dL   Saturation Ratios 2 (L) 10.4 - 31.8 %   UIBC 415 ug/dL    Comment: Performed at [facility], [address] [place], [place] [address]  Folate     Status: None   Collection Time: 01/13/23  3:38 AM  Result Value Ref Range   Folate 10.8 >5.9 ng/mL    Comment: Performed at [facility], [address] [place], [place] [address]  Prepare RBC (crossmatch)     Status: None   Collection Time: 01/13/23  5:39 AM  Result Value Ref Range   Order Confirmation      ORDER PROCESSED BY BLOOD BANK Performed at [facility], [address] [place], [place] [address]   Hemoglobin A1c     Status: None   Collection Time: 01/13/23  7:09 AM  Result Value Ref Range   Hgb A1c MFr Bld 5.1 4.8 - 5.6 %    Comment: (NOTE) Pre diabetes:          5.7%-6.4%  Diabetes:              >6.4%  Glycemic control for   <7.0% adults with diabetes    Mean Plasma Glucose 99.67 mg/dL    Comment: Performed at [facility], [address], [place], [place] [address]  Hemoglobin and hematocrit, blood     Status: Abnormal   Collection Time: 01/13/23 11:54 AM  Result Value Ref Range   Hemoglobin 8.4 (L) 12.0 - 15.0 g/dL   HCT 56.4 (L) 33.2 - 95.1 %    Comment: Performed at [facility], [address] [place], [place] [address]   CT ANGIO HEAD NECK W WO CM  Result Date: 01/13/2023 CLINICAL DATA:  Stroke, determine embolic source EXAM: CT ANGIOGRAPHY HEAD AND NECK WITH AND WITHOUT  CONTRAST TECHNIQUE: Multidetector CT imaging of the head and neck was performed using the standard protocol during bolus administration of intravenous contrast. Multiplanar CT image reconstructions and MIPs were obtained to evaluate the vascular anatomy. Carotid stenosis measurements (when applicable) are obtained utilizing NASCET criteria, using the distal internal carotid diameter as the denominator. RADIATION DOSE REDUCTION: This exam was performed according to the departmental dose-optimization program which includes automated exposure control, adjustment of the mA and/or kV according to patient size and/or use of iterative reconstruction technique. CONTRAST:  75mL OMNIPAQUE IOHEXOL 350 MG/ML SOLN COMPARISON:  Brain MRI from earlier today FINDINGS: CTA NECK FINDINGS Aortic arch: Atheromatous plaque with 2 vessel branching Right carotid system: Mild atheromatous plaque at the bifurcation. No stenosis or ulceration Left carotid system: Unremarkable Vertebral arteries: No proximal subclavian stenosis. The left vertebral artery arises from essentially the left subclavian origin. Based on sagittal images left vertebral were gin stenosis measures proximally 30%. Skeleton: Hyperostosis interna Other neck: Negative Upper chest: Large main pulmonary artery suggesting pulmonary hypertension, partially covered but measuring at least 3.6 cm. Review of the MIP images confirms the above findings CTA HEAD FINDINGS Anterior circulation: Atheromatous plaque affects the carotid siphons. No branch occlusion, beading, or aneurysm. Minimal atheromatous irregularity of intracranial branches. Posterior circulation: The vertebral and basilar arteries are smoothly contoured and diffusely patent. No branch occlusion, beading, or aneurysm. Mild atheromatous irregularity asymmetrically affecting the bilateral PCA. Venous sinuses: Diffusely patent Anatomic variants: None significant Review of the MIP images confirms the above findings  IMPRESSION: No emergent finding. No flow limiting stenosis or vessel irregularity underlying the patient's white matter infarct. Electronically Signed   By: Tiburcio Pea M.D.   On: 01/13/2023 04:38   MR BRAIN WO CONTRAST  Result Date: 01/13/2023 CLINICAL DATA:  Initial evaluation for neuro deficit, stroke suspected. EXAM: MRI HEAD WITHOUT CONTRAST TECHNIQUE: Multiplanar, multiecho pulse sequences of the brain and surrounding structures were obtained without intravenous contrast. COMPARISON:  Prior CT from 01/12/2023. FINDINGS: Brain: Generalized age-related cerebral atrophy. Patchy and confluent T2/FLAIR hyperintensity involving the periventricular deep white matter both cerebral hemispheres as well as the pons, consistent with chronic small vessel ischemic disease, moderate to advanced in nature. Few scatter remote lacunar infarcts present about the bilateral basal ganglia and left hemispheric cerebral white matter. 1.1 cm focus of restricted diffusion involving the right frontal corona radiata, consistent with an acute ischemic infarct (series 5, image 78). No associated hemorrhage or mass effect. No other evidence for acute or subacute ischemia. Gray-white matter differentiation otherwise maintained. No acute intracranial hemorrhage. Multiple scattered chronic micro hemorrhages noted, many which are peripheral location, and could be due to cerebral amyloid angiopathy. No mass lesion, midline shift or mass effect. No hydrocephalus or extra-axial fluid collection. Pituitary gland and suprasellar region within normal limits. Vascular: Major intracranial vascular flow voids are maintained. Skull and upper cervical spine: Craniocervical junction within normal limits. Bone marrow signal intensity diffusely heterogeneous. No visible focal marrow replacing lesion. Hyperostosis frontalis interna noted. No scalp soft tissue abnormality. Sinuses/Orbits: Prior bilateral ocular lens replacement. Paranasal sinuses are  largely clear. No significant mastoid effusion. Other: None. IMPRESSION: 1. 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 2. Underlying age-related cerebral atrophy with fairly advanced chronic microvascular ischemic disease. 3. Multiple scattered chronic micro hemorrhages. Overall pattern is nonspecific, with primary differential considerations including sequelae of cerebral amyloid angiopathy or hypertension. Electronically Signed   By: Rise Mu M.D.   On: 01/13/2023 01:53   CT HEAD WO CONTRAST  Result Date: 01/12/2023 CLINICAL DATA:  Slurred speech EXAM: CT HEAD WITHOUT CONTRAST TECHNIQUE: Contiguous axial images were obtained from the base of the skull through the vertex without intravenous contrast. RADIATION DOSE REDUCTION: This exam was performed according to the departmental dose-optimization program which includes automated exposure control, adjustment of the mA and/or kV according to patient size and/or use of iterative reconstruction technique. COMPARISON:  None Available. FINDINGS: Brain: No territorial infarction, hemorrhage, or intracranial mass. Small age indeterminate lacunar infarct within the left periventricular white matter. Atrophy. Mild chronic small vessel ischemic changes of the white matter. Nonenlarged ventricles Vascular: No hyperdense vessels.  Carotid vascular calcification Skull: Normal. Negative for fracture or focal lesion. Sinuses/Orbits: No acute finding. Other: None IMPRESSION: 1. 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 2. Atrophy and mild chronic small vessel ischemic changes of the white matter. Electronically Signed   By: Jasmine Pang M.D.   On: 01/12/2023 19:39   DG Chest Portable 1 View  Result Date: 01/12/2023 CLINICAL DATA:  Chest pain EXAM: PORTABLE CHEST 1 VIEW COMPARISON:  11/18/2022 FINDINGS: Low lung volumes. Cardiomegaly with central congestion. No acute airspace  disease, pleural effusion, or pneumothorax. Patchy atelectasis left base. IMPRESSION: Low lung volumes with cardiomegaly and central congestion. Electronically Signed   By: Jasmine Pang M.D.   On: 01/12/2023 19:32    Pending Labs Unresulted Labs (From admission, onward)     Start     Ordered   01/14/23 0500  CBC  Tomorrow morning,   R        01/13/23 1442   01/14/23 0500  Basic metabolic panel  Tomorrow morning,   R        01/13/23 1442   01/12/23 1735  Urine Culture  Once,   URGENT       Question:  Indication  Answer:  Dysuria   01/12/23 1734            Vitals/Pain Today's Vitals   01/13/23 0836 01/13/23 0836 01/13/23 0919 01/13/23 1541  BP:   (!) 138/48 (!) 131/57  Pulse:   90 92  Resp:   17 16  Temp:  98 F (36.7 C) 97.8 F (36.6 C) 97.8 F (36.6 C)  TempSrc:   Oral Oral  SpO2:   99% 100%  Weight:      Height:      PainSc: 0-No pain 0-No pain  0-No pain    Isolation Precautions No active isolations  Medications Medications  sodium chloride flush (NS) 0.9 % injection 3 mL (3 mLs Intravenous Not Given 01/12/23 1707)  cyclobenzaprine (FLEXERIL) tablet 10 mg (10 mg Oral Given 01/12/23 2344)  albuterol (PROVENTIL) (2.5 MG/3ML) 0.083% nebulizer solution 2.5 mg (has no administration in time range)  polyethylene glycol (MIRALAX / GLYCOLAX) packet 17 g (has no administration in time range)  aspirin EC tablet 81 mg (81 mg Oral Given 01/13/23 1143)  peg 3350 powder (MOVIPREP) kit 100 g (has no administration in time range)    And  peg 3350 powder (MOVIPREP) kit 100 g (has no administration in time range)  atorvastatin (LIPITOR) tablet 40 mg (40 mg Oral Given 01/13/23 1143)  pantoprazole (PROTONIX) injection 40 mg (has no administration in time range)  pantoprozole (PROTONIX) 80 mg /NS 100 mL infusion (8 mg/hr Intravenous Rate/Dose Verify 01/13/23 1539)  cefTRIAXone (ROCEPHIN) 1 g in sodium chloride 0.9 % 100 mL IVPB (0 g Intravenous Stopped 01/12/23 1921)  pantoprazole  (PROTONIX) 80 mg /NS 100 mL IVPB (0 mg Intravenous Stopped 01/12/23 2146)  hydrOXYzine (ATARAX) tablet 25 mg (25 mg Oral Given 01/12/23 2347)  iohexol (OMNIPAQUE) 350 MG/ML injection 75 mL (75 mLs Intravenous Contrast Given 01/13/23 0425)  0.9 %  sodium chloride infusion (Manually program via Guardrails IV Fluids) ( Intravenous New Bag/Given 01/13/23 5621)    Mobility walks     Focused Assessments Neuro Assessment Handoff:  Swallow screen pass? Yes  Cardiac Rhythm: Normal sinus rhythm NIH Stroke Scale  Dizziness Present: No Headache Present: No Interval: Shift assessment Level of Consciousness (1a.)   : Alert, keenly responsive LOC Questions (1b. )   : Answers both questions correctly LOC Commands (1c. )   : Performs both tasks correctly Best Gaze (2. )  : Normal Visual (3. )  : No visual loss Facial Palsy (4. )    : Normal symmetrical movements Motor Arm, Left (5a. )   : No drift Motor Arm, Right (5b. ) : No drift Motor Leg, Left (6a. )  : No drift Motor Leg, Right (6b. ) : No drift Limb Ataxia (7. ): Absent Sensory (8. )  : Normal, no sensory loss Best Language (9. )  : No aphasia Dysarthria (10. ): Normal Extinction/Inattention (11.)   : No Abnormality Complete NIHSS TOTAL: 0 Last date known well: 01/12/23 Last time known well: 0729 Neuro Assessment: Within Defined Limits Neuro Checks:   Initial (01/12/23 1459)  Has TPA been given? No If patient is a Neuro Trauma and patient is going  to OR before floor call report to 4N Charge nurse: [phone] or [phone]   R Recommendations: See Admitting Provider Note  Report given to:   Additional Notes: Pt NIH score is 0. She need to be NPO after midnight for EGD and colonoscopy. She need her movie prep started at 1700. She is on clear liquid

## 2023-01-13 NOTE — Assessment & Plan Note (Addendum)
Likely in the setting of GIB, positive FOBT in the ED. No signs of active bleeding noted per patient. Hgb upon admission 7.0, improved to 7.5 after 1u PRBC. Currently getting another unit of PRBC due to being symptomatic and having a small stroke. - Monitor with CBC this H&H - 2 large-bore IVs - IV Protonix today and will transition to Protonix 40 IM BID - GI consulted, appreciate recs regarding restarting Xarelto s/p EGD - EGD tomorrow AM, NPO at MN

## 2023-01-13 NOTE — Assessment & Plan Note (Addendum)
Pt w/ symptomatic anemia, Hgb 7, w/ positive FOBT. Patient reports no history of dark, tarry stools, no bright red blood in stools. No history of bleeding she is aware of. Chart review does not provide a history of endoscopy or colonoscopy. The ED reached out to GI, plans to be seen in AM. -Transfuse 1 unit PRBC -2 Large bore IV -NPO at midnight -GI following, appreciate recs - IV Protonix

## 2023-01-13 NOTE — Assessment & Plan Note (Addendum)
Pt was found to have a urinary tract infection (large nitrites, culture pending) on admission. Pt states that she has not had dysuria, but always feels increased urgency to urinate. No history of hematuria. She was treated for UTI in the ED, but given her lack of symptoms, may consider not treating for asymptomatic bacteriuria.  - Pt's last UTI was 10/2022  - Urine Culture Pending, drawn 9/24 - 1 Dose ceftriaxone given in ED, will hold off additional doses until sensitivities/specificities come back

## 2023-01-13 NOTE — ED Notes (Signed)
Patient refuse to wear the blood pressure cuff  continuously so she can be monitored. Per patient it hurt her arm

## 2023-01-13 NOTE — Consult Note (Signed)
NEUROLOGY CONSULTATION NOTE   Date of service: January 13, 2023 Patient Name: Megan Espinoza MRN:  [id number] DOB:  [birth year] Reason for consult: "period of confusion vs aphasia, found to have small R frontal corona radiata stroke" Requesting Provider: Doreene Eland, MD _ _ _   _ __   _ __ _ _  __ __   _ __   __ _  History of Present Illness  Megan Espinoza is a 67 y.o. female with PMH significant for obesity, COPD, Afibb on Xarelto, HTN who was sent to the ED by her PCP after she had a fall.  Patient reports that she has been feeling lethargic and tired for the last few days, specifically worse since Saturday.  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  She reports that they were singing in the car and she noted that her speech sounded a little slurred.  She dropped them off at the school and then drove over to work.  At work, she reports that she was having some trouble typing and typing incorrect words.  On try to clarify this further, she reports that she was struggling with typing with the new flat keyboard at her workplace which she is not used to.  She denies any task [place] apraxia or any difficulty finding words.  She sent an email and then she left to go to a nail salon.  She reports that she was getting up from a rolling chair when she entangled herself and fell.  She did not hit her head.  Later in the day, she and her husband called her PCP's office and was directed her to come to ED since she is on Xarelto. Here, found to have anemia with Hb of 7.0. Also found to have UTI and MRI brain demonstrates a small R frontal corona radiata stroke.  Neurology consulted for further evaluation and workup of the noted stroke.  LKW: Unclear since she has no symptoms. mRS: 0 tNKASE: Not offered due to no symptoms and patient is on Xarelto. Thrombectomy: Not offered due to low suspicion for LVO. NIHSS components Score: Comment  1a Level of  Conscious 0[x]  1[]  2[]  3[]      1b LOC Questions 0[x]  1[]  2[]       1c LOC Commands 0[x]  1[]  2[]       2 Best Gaze 0[x]  1[]  2[]       3 Visual 0[x]  1[]  2[]  3[]      4 Facial Palsy 0[x]  1[]  2[]  3[]      5a Motor Arm - left 0[x]  1[]  2[]  3[]  4[]  UN[]    5b Motor Arm - Right 0[x]  1[]  2[]  3[]  4[]  UN[]    6a Motor Leg - Left 0[x]  1[]  2[]  3[]  4[]  UN[]    6b Motor Leg - Right 0[x]  1[]  2[]  3[]  4[]  UN[]    7 Limb Ataxia 0[x]  1[]  2[]  3[]  UN[]     8 Sensory 0[x]  1[]  2[]  UN[]      9 Best Language 0[x]  1[]  2[]  3[]      10 Dysarthria 0[x]  1[]  2[]  UN[]      11 Extinct. and Inattention 0[x]  1[]  2[]       TOTAL: 0     ROS   Constitutional Denies weight loss, fever and chills.   HEENT Denies changes in vision and hearing.   Respiratory Denies SOB and cough.   CV Denies palpitations and CP   GI Denies abdominal pain, nausea, vomiting and diarrhea.   GU Denies dysuria and urinary  frequency.   MSK Denies myalgia and joint pain.   Skin Denies rash and pruritus.   Neurological Denies headache and syncope.   Psychiatric Denies recent changes in mood. Denies anxiety and depression.    Past History   Past Medical History:  Diagnosis Date   Influenza, pneumonia December 2013   Associated with ARDS, Septic shock   Obesity (BMI 30.0-34.9)    Past Surgical History:  Procedure Laterality Date   CHOLECYSTECTOMY     HERNIA REPAIR     Family History  Problem Relation Age of Onset   Hypertension Other    Social History   Socioeconomic History   Marital status: Widowed    Spouse name: Not on file   Number of children: Not on file   Years of education: Not on file   Highest education level: Not on file  Occupational History   Not on file  Tobacco Use   Smoking status: Never   Smokeless tobacco: Never  Vaping Use   Vaping status: Never Used  Substance and Sexual Activity   Alcohol use: Yes    Comment: occasional   Drug use: No   Sexual activity: Not on file  Other Topics Concern   Not on file  Social  History Narrative   Not on file   Social Determinants of Health   Financial Resource Strain: Not on file  Food Insecurity: Not on file  Transportation Needs: Not on file  Physical Activity: Not on file  Stress: Not on file  Social Connections: Not on file   Allergies  Allergen Reactions   Penicillins Swelling   Codeine Nausea And Vomiting   Erythromycin     Mouth thrush- per pt   Sulfa Antibiotics     Mouth thrush- per pt    Medications  (Not in a hospital admission)    Vitals   Vitals:   01/12/23 2233 01/12/23 2300 01/12/23 2315 01/12/23 2345  BP: (!) 141/72 (!) 129/58 123/85 123/69  Pulse: (!) 101 96 100   Resp: (!) 22 19 (!) 23 19  Temp: 98.2 F (36.8 C) 98.2 F (36.8 C)    TempSrc: Oral Oral    SpO2: 97% 98% 98%   Weight:      Height:         Body mass index is 47.61 kg/m.  Physical Exam   General: Laying comfortably in bed; in no acute distress.  HENT: Normal oropharynx and mucosa. Normal external appearance of ears and nose.  Neck: Supple, no pain or tenderness  CV: No JVD. No peripheral edema.  Pulmonary: Symmetric Chest rise. Normal respiratory effort.  Abdomen: Soft to touch, non-tender.  Ext: No cyanosis, edema, or deformity  Skin: No rash. Normal palpation of skin.   Musculoskeletal: Normal digits and nails by inspection. No clubbing.   Neurologic Examination  Mental status/Cognition: Alert, oriented to self, place, month and year, good attention.  Speech/language: Fluent, comprehension intact, object naming intact, repetition intact.  Cranial nerves:   CN II Pupils equal and reactive to light, no VF deficits    CN III,IV,VI EOM intact, no gaze preference or deviation, no nystagmus    CN V normal sensation in V1, V2, and V3 segments bilaterally    CN VII no asymmetry, no nasolabial fold flattening    CN VIII normal hearing to speech    CN IX & X normal palatal elevation, no uvular deviation    CN XI 5/5 head turn and 5/5 shoulder shrug  bilaterally  CN XII midline tongue protrusion    Motor:  Muscle bulk: normal, tone normal, pronator drift none tremor none Mvmt Root Nerve  Muscle Right Left Comments  SA C5/6 Ax Deltoid 5 5   EF C5/6 Mc Biceps 5 5   EE C6/7/8 Rad Triceps 5 5   WF C6/7 Med FCR     WE C7/8 PIN ECU     F Ab C8/T1 U ADM/FDI 5 5   HF L1/2/3 Fem Illopsoas 5 5   KE L2/3/4 Fem Quad 5 5   DF L4/5 D Peron Tib Ant 5 5   PF S1/2 Tibial Grc/Sol 5 5    Sensation:  Light touch Intact throughout   Pin prick    Temperature    Vibration   Proprioception    Coordination/Complex Motor:  - Finger to Nose intact bilaterally - Heel to shin intact bilaterally - Rapid alternating movement are normal - Gait: Stride length short. Arm swing poor. Base width narrow.  Labs   CBC:  Recent Labs  Lab 01/12/23 1530  WBC 9.2  NEUTROABS 6.5  HGB 7.0*  HCT 24.4*  MCV 82.4  PLT 342    Basic Metabolic Panel:  Lab Results  Component Value Date   NA 139 01/12/2023   K 3.7 01/12/2023   CO2 25 01/12/2023   GLUCOSE 110 (H) 01/12/2023   BUN 23 01/12/2023   CREATININE 1.02 (H) 01/12/2023   CALCIUM 8.8 (L) 01/12/2023   GFRNONAA >60 01/12/2023   GFRAA 72 (L) 04/15/2012   Lipid Panel: No results found for: "LDLCALC" HgbA1c: No results found for: "HGBA1C" Urine Drug Screen: No results found for: "LABOPIA", "COCAINSCRNUR", "LABBENZ", "AMPHETMU", "THCU", "LABBARB"  Alcohol Level     Component Value Date/Time   ETH <10 01/12/2023 1530   INR  Lab Results  Component Value Date   INR 1.2 01/12/2023   APTT  Lab Results  Component Value Date   APTT 29 01/12/2023     CT Head without contrast(Personally reviewed): CTH was negative for a large hypodensity concerning for a large territory infarct or hyperdensity concerning for an ICH  CT angio Head and Neck with contrast: pending  MRI Brain(Personally reviewed): Small acute right corona radiata infarct.  Impression   Megan Espinoza is a 67 y.o.  female with PMH significant for obesity, COPD, Afibb on Xarelto, HTN who was sent to the ED by her PCP after she had a fall and slurred speech earlier in the day along with lethargy over the last few days. Her neurologic examination is notable for no focal deficits.  She was found to have anemia with hemoglobin of 7.0, UTI and incidental small right frontal corona radiata stroke.  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  With the noted anemia, reasonable to hold anticoagulation.  From a stroke standpoint, she will benefit from being on anticoagulation to prevent her from having any stroke in the setting of A-fib.  However, if this is not suitable from a GI standpoint, would recommend considering an antiplatelet agent.  As for the etiology of the stroke, I suspect this is likely a small vessel stroke.  Primary Diagnosis:  Other cerebral infarction due to occlusion of stenosis of small artery.  Secondary Diagnosis: Essential (primary) hypertension  Recommendations  - Frequent Neuro checks  per stroke unit protocol - Recommend Vascular imaging with CT angio head and neck with without contrast. -Unlikely to benefit from TTE especially with the known history of A-fib. - Recommend obtaining Lipid panel with LDL - Please start statin if LDL > 70 - Recommend HbA1c to evaluate for diabetes and how well it is controlled. - She would benefit from being on anticoagulation to prevent her from having any stroke in the setting of A-fib.  However, if this is not suitable from a GI standpoint, would recommend considering an antiplatelet agent. - SBP goal -aim for gradual normotension. - Recommend Telemetry monitoring for arrythmia - Recommend bedside swallow screen prior to PO intake. - Stroke  education booklet - Recommend PT/OT/SLP consult  ______________________________________________________________________   Thank you for the opportunity to take part in the care of this patient. If you have any further questions, please contact the neurology consultation attending.  Signed,  Erick Blinks [facility] _ _ _   _ __   _ __ _ _  __ __   _ __   __ _

## 2023-01-13 NOTE — Hospital Course (Addendum)
Megan Espinoza is a 67 y.o.female with a past medical history of A Fib on Xarelto and HTN who was admitted to the [facility] Medicine Teaching Service at [facility] for symptomatic anemia likely secondary to a possible GIB and aphasia found to have a small ischemic stroke. Her hospital course is detailed below:  Symptomatic Anemia Patient presented to the ED feeling tired and weak. Found to have Hgb of 7 and FOBT positive. Received two units of PRBC in the ED and was started on IV Protonix due to suspected GIB. Hgb stabilized to 8.0. GI was consulted who recommended EGD and Colonoscopy which was performed 9/27 which showed 3 nonbleeding ulcers which were biopsied. Thought GIB most likely 2/2 to pyloric ulcers and/ or Sheria Lang' s erosions. Colonoscopy demonstrated diverticulosis in the sigmoid and descending colon, one 4mm polyp in the sigmoid colon which was removed, and non-bleeding internal hemorrhoids. GI recommended PO Protonix BID for 10 weeks (ending 12/5) and then once daily thereafter.  Xarelto was switched to Eliquis per neurology recommendations due to possible Xarelto failure and patient was advised to start medication one day after discharge (9/28). Discussed the risks of bleeding with restarting the Eliquis without close monitoring in the hospital especially in the setting of her GIB, patient understands the risks and would like to discharge prior to starting Eliquis tomorrow and will follow up closely with PCP within one week to check labs. Upon discharge Hgb remained stable at 7.9.  Acute Ischemic Stroke Patient presented feeling off s/p fall. Stroke workup was done. CT head w/o contrast demonstrated age indeterminate hypodensity/lacunar infarct within the left white matter. MR brain w/o contrast 1.1 cm acute ischemic nonhemorrhagic infarct. Echo demonstrated EF 60-65%, L atrial moderately dilated, mild mitral valve regurgitation, and moderate aortic valve stenosis. Negative bubble study. Continued  ASA 81 mg. Started Atorvastatin 40 mg daily and switched AC to Eliquis after GI workup to start on 9/28.   Asymptomatic Bacteruria UA demonstrated many bacteria, large leukocytes, positive nitrites, small hematuria, and proteinuria 30 in the ED. Patient had no complaints of dysuria or increased urinary frequency. Urine culture showed Citrobacter koseri and E. coli, she was given one dose of Rocephin in the ED, but we did not continue antibiotics during her admission due to her lack of symptoms.   Other chronic conditions were medically managed with home medications and formulary alternatives as necessary: Atrial Fibrillation: On Xarelto 20 mg daily, held for possible GIB HTN: Held home amlodipine 10 mg tablet, hydrochlorothiazide 12.5 mg, and metoprolol XR 25 mg for permissive HTN, only restarted amlodipine 10 mg upon discharge. COPD: Albuterol q4 PRN  PCP Follow-up Recommendations: Follow up early next week with a CBC to monitor bleeding Consider sleep study outpatient Consider Watchman placement outpatient Follow up pathology results from EGD. PPI PO BID for 10 weeks (till 12/5) and then once daily. Repeat EGD recommended in 2- 3 months to assess for healing of gastric ulcers.

## 2023-01-13 NOTE — Assessment & Plan Note (Addendum)
UA demonstrated many bacteria, large leukocytes, positive nitrites, small hematuria, proteinuria 30.  No complaints of dysuria or increased urinary frequency. - s/p 1 dose Rocephin in the ED, no need for more abx at this time - discontinue urine culture

## 2023-01-13 NOTE — Progress Notes (Addendum)
Daily Progress Note Intern Pager: [phone]  Patient name: Megan Espinoza Medical record number: [id number] Date of birth: 18-Mar-1956 Age: 67 y.o. Gender: female  Primary Care Provider: Ellyn Hack, MD Consultants: Neurology, GI Code Status: Full Code  Pt Overview and Major Events to Date:  9/24: Admitted + 1u PRBC 9/25: 1u PRBC  Assessment and Plan: Megan Espinoza is a 67 y.o. female with past medical history of Afib on Xarelto and HTN presenting with symptomatic anemia secondary to possible GIB and aphasia found to have a small ischemic stroke.  Assessment & Plan Symptomatic anemia Likely in the setting of GIB, positive FOBT in the ED. No signs of active bleeding noted per patient. Hgb upon admission 7.0, improved to 7.5 after 1u PRBC. Currently getting another unit of PRBC due to being symptomatic and having a small stroke. - Monitor with CBC this H&H - 2 large-bore IVs - IV Protonix today and will transition to Protonix 40 IM BID - GI consulted, appreciate recs regarding restarting Xarelto s/p EGD - EGD tomorrow AM, NPO at MN Acute ischemic stroke Megan Espinoza) CT head w/o contrast demonstrated age indeterminate hypodensity/lacunar infarct within the left white matter. MR brain w/o contrast 1.1cm acute ischemic nonhemorrhagic infarct involving the R frontal corona radiata. Lipid panel demonstrated LDL 78, HDL 35, and TG 106, will start statin therapy. A1c well controlled at 5.1. Passed swallow study, will change diet depending on GI. - Atorvastatin 40 mg - Neuro checks q4 - Will restart Xarelto depending on GI recs - Permissive HTN per neuro - Cardiac monitoring - PT/OT - Neuro on board, appreciate recs Asymptomatic bacteriuria UA demonstrated many bacteria, large leukocytes, positive nitrites, small hematuria, proteinuria 30.  No complaints of dysuria or increased urinary frequency. - s/p 1 dose Rocephin in the ED, no need for more abx at this time - discontinue  urine culture  Chronic and Stable Problems:  Atrial Fibrillation: On Xarelto 20 mg daily, held for possible GIB HTN: Held home amlodipine 10 mg tablet, hydrochlorothiazide 12.5 mg, and metoprolol XR 25 mg for permissive HTN  FEN/GI: CLD PPx: SCDs Dispo: pending neuro/GI eval  Subjective:  Patient is very agitated this morning regarding the fact that she is still in the ED and used some choice words to describe how she would like to go home.  She denies any pain at this time.  She notes that she was taking Aleve daily to relieve her bilateral knee pain which could be the cause of her symptoms.  Objective: Temp:  [97.8 F (36.6 C)-98.4 F (36.9 C)] 97.9 F (36.6 C) (09/25 0630) Pulse Rate:  [85-118] 85 (09/25 0645) Resp:  [11-28] 17 (09/25 0645) BP: (113-157)/(39-94) 116/57 (09/25 0645) SpO2:  [95 %-100 %] 97 % (09/25 0645) Weight:  [133.8 kg] 133.8 kg (09/24 1457) Physical Exam: General: Agitated, awake and Alert in NAD HEENT: Normocephalic, atraumatic. Conjunctiva normal. No nasal discharge Cardiovascular: RRR. No M/R/G Respiratory: CTAB, normal WOB on RA. No wheezing, crackles, rhonchi, or diminished breath sounds. Abdomen: Soft, non-tender, non-distended. Bowel sounds normoactive Extremities: No BLE edema, no deformities or significant joint findings. Skin: Warm and dry. Neuro: A&Ox3. No focal neurological deficits.   Laboratory: Most recent CBC Lab Results  Component Value Date   WBC 7.9 01/13/2023   HGB 7.5 (L) 01/13/2023   HGB 7.6 (L) 01/13/2023   HCT 26.2 (L) 01/13/2023   MCV 82.1 01/13/2023   PLT 323 01/13/2023   Most recent BMP  Latest Ref Rng & Units 01/13/2023    3:38 AM  BMP  Glucose 70 - 99 mg/dL 409   BUN 8 - 23 mg/dL 21   Creatinine 8.11 - 1.00 mg/dL 9.14   Sodium 782 - 956 mmol/L 140   Potassium 3.5 - 5.1 mmol/L 3.5   Chloride 98 - 111 mmol/L 106   CO2 22 - 32 mmol/L 26   Calcium 8.9 - 10.3 mg/dL 8.8    Imaging/Diagnostic Tests: CTA head and  neck Impression: No emergent finding. No flow limiting stenosis or vessel irregularity underlying the patient's white matter infarct.  Megan Curling, DO 01/13/2023, 7:46 AM  PGY-1, [facility] FPTS Intern pager: [phone], text pages welcome Secure chat group [facility] Teaching Service

## 2023-01-13 NOTE — Assessment & Plan Note (Signed)
Patient noted feeling weak/tired for last week. On admission her vitals were stable, hemoglobin was 7, fecal occult blood was positive, although she reports no blood in stools. Anemia is likely secondary to a GI bleed, as MCV normal, and patient last Hgb 11.6, 1 month ago. Patient reports recently starting Xarelto for A. Fib, about a month ago. Patient was transfused 1 unit PRBC for symptomatic anemia in the ED. GI consulted for GI Bleed. - Hgb check at 230 am (4 hours post transfusion) - GI consulted, will come see her 09/25. - Pt NPO after midnight - BMP, CBC AM labs

## 2023-01-13 NOTE — Assessment & Plan Note (Deleted)
Hgb 7.0 with positive FOBT in the ED. No active bleeding noted per patient.  - GI consulted, appreciate recs regarding restarting Xarelto vs intervention - s/p 2 units PRBC - 2 large-bore IVs - IV Protonix

## 2023-01-13 NOTE — ED Notes (Signed)
Pt able to ambulate to bathroom with no assistance while walking.

## 2023-01-13 NOTE — Assessment & Plan Note (Addendum)
CT head w/o contrast demonstrated age indeterminate hypodensity/lacunar infarct within the left white matter. MR brain w/o contrast 1.1cm acute ischemic nonhemorrhagic infarct involving the R frontal corona radiata. Lipid panel demonstrated LDL 78, HDL 35, and TG 106, will start statin therapy. A1c well controlled at 5.1. Passed swallow study, will change diet depending on GI. - Atorvastatin 40 mg - Neuro checks q4 - Will restart Xarelto depending on GI recs - Permissive HTN per neuro - Cardiac monitoring - PT/OT - Neuro on board, appreciate recs

## 2023-01-13 NOTE — Progress Notes (Signed)
Phone call with Neurology about CVA  Dr. Nile Dear - Neurology  Spoke with on call neurologist about patient's presentation, admission, and MRI finding. He note's that the stroke is small and there's nothing to do right now. He note's that they will stop by and discuss quickly with patient, and plan to follow in the am.   Discussed patient having active GI bleed, and wether we start ASA/Plavix. He notes that we can start ASA tomorrow am, and we don't know Plavix. He says once we have GI's blessing, we can restart Xarelto.   He reported that the stroke doesn't explain her aphasia, and theres nothing to do currently. Low hgb like this can cause transient low flow to the brain and produce these symptoms. The stroke is on wrong side of brain for symptoms.  Thanked provider, and placed order for ASA 81 to start in AM.

## 2023-01-13 NOTE — ED Notes (Signed)
Pt currently in MRI

## 2023-01-13 NOTE — Consult Note (Signed)
Consultation Note   Referring Provider:  Family Medicine Teaching Service PCP: Ellyn Hack, MD Primary Gastroenterologist: Gentry Fitz        Reason for Consultation: Anemia  DOA: 01/12/2023         Hospital Day: 2   ASSESSMENT    Brief Narrative:  67 y.o. 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  Anemia, (acute on chronic) and FOBT+ on Xarelto and also daily NSAIDs. Rule out PUD, AVMs, intestinal neoplasm.   Hgb 7, down from 11.6 in July. Borderline MCV.   AFIB, takes Xarelto at home, on hold now. Last dose was 9/23  Chronic GERD with pyrosis. Asymptomatic on daily Prilosec  UTI On Rocephin  Constipation since starting Xarelto     PLAN:   --I ordered iron studies, and folate. B12 level already ordered. She gives a history of pernicious anemia --She will need endoscopic evaluation.  She is at increased risk with acute CVA. The risks and benefits of EGD and colonoscopy with possible biopsies / polypectomy were discussed with the patient who agrees to proceed.  --Clear liquids ordered. NPO after MN -If iron deficient will evaluate for celiac disease   HPI   Megan Espinoza presented to ED 9/24 with confusion / slurred speech . Hgb was 7, she was heme +. No acute findings on head CT scan but MRI showed ischemic stroke. Neurology evaluated and recommended starting ASA for now and Xarelto when cleared by GI. They felt aphasia was likely from anemia as stroke was on wrong side for her symptoms. Latarra hasn't had any overt GI bleeding. No N/V or abdominal pain. Weight stable. She has developed constipation since starting Xarelto a month ago. Takes a daily stool softener and mild laxative.  She has chronic GERD with heartburn but symptoms controlled on daily prilosec. She has taken Aleve BID for about a year  for knee pain. She doesn't donate blood. No hematuria or vaginal bleeding. She gives a remote history of iron deficiency anemia and took iron for years. She also gives a history of pernicious anemia. Doesn't take B12.   No general medical complaints.   ED / Admission workup notable for :   Hgb of 7, down from 11.6 the end of July MCV 82 but 77 in late July Normal WBC and platelet count Normal INR  MRI Brain   1.1 cm acute ischemic nonhemorrhagic infarct involving the right frontal corona radiata. 2. Underlying age-related cerebral atrophy with fairly advanced chronic microvascular ischemic disease. 3. Multiple scattered chronic micro hemorrhages. Overall pattern is nonspecific, with primary differential considerations including sequelae of cerebral amyloid angiopathy or hypertension.  Previous GI Evaluations   none  Labs and Imaging: Recent Labs    01/12/23 1530 01/12/23 1545 01/13/23 0338  WBC 9.2  --  7.9  HGB 7.0* 7.5* 7.5*  7.6*  HCT 24.4* 22.0* 26.2*  PLT 342  --  323   Recent Labs    01/12/23 1530 01/12/23 1545 01/13/23 0338  NA 139 140 140  K 3.7 3.8 3.5  CL 104 108 106  CO2 25  --  26  GLUCOSE  110* 111* 127*  BUN 23 24* 21  CREATININE 1.02* 1.00 1.01*  CALCIUM 8.8*  --  8.8*   Recent Labs    01/12/23 1530  PROT 6.4*  ALBUMIN 2.8*  AST 18  ALT 14  ALKPHOS 96  BILITOT 0.5   No results for input(s): "HEPBSAG", "HCVAB", "HEPAIGM", "HEPBIGM" in the last 72 hours. Recent Labs    01/12/23 1530  LABPROT 15.4*  INR 1.2      Past Medical History:  Diagnosis Date   Influenza, pneumonia December 2013   Associated with ARDS, Septic shock   Obesity (BMI 30.0-34.9)     Past Surgical History:  Procedure Laterality Date   CHOLECYSTECTOMY     HERNIA REPAIR      Family History  Problem Relation Age of Onset   Hypertension Other     Prior to Admission medications   Medication Sig Start Date End Date Taking? Authorizing Provider   acetaminophen (TYLENOL) 325 MG tablet Take 2 tablets (650 mg total) by mouth every 6 (six) hours as needed (or Fever >/= 101). 04/14/12  Yes Simonne Martinet, NP  albuterol (VENTOLIN HFA) 108 (90 Base) MCG/ACT inhaler Inhale 2 puffs into the lungs every 4 (four) hours as needed for wheezing or shortness of breath. 07/19/19  Yes Mickie Bail, NP  amLODipine (NORVASC) 10 MG tablet Take 10 mg by mouth daily. 06/21/21  Yes [provider]  cyclobenzaprine (FLEXERIL) 10 MG tablet Take 10 mg by mouth at bedtime. 12/31/22  Yes [provider]  hydrochlorothiazide (HYDRODIURIL) 25 MG tablet Take 12.5 mg by mouth daily. 06/16/21  Yes [provider]  metoprolol succinate (TOPROL-XL) 25 MG 24 hr tablet Take 25 mg by mouth daily. 12/03/22  Yes [provider]  naproxen sodium (ALEVE) 220 MG tablet Take 220 mg by mouth in the morning and at bedtime.   Yes [provider]  omeprazole (PRILOSEC) 20 MG capsule Take 20 mg by mouth daily.   Yes [provider]  XARELTO 20 MG TABS tablet Take 20 mg by mouth daily. 12/03/22  Yes [provider]    Current Facility-Administered Medications  Medication Dose Route Frequency Provider Last Rate Last Admin   0.9 %  sodium chloride infusion (Manually program via Guardrails IV Fluids)   Intravenous Once Margaretmary Dys, MD   Held at 01/12/23 2003   albuterol (PROVENTIL) (2.5 MG/3ML) 0.083% nebulizer solution 2.5 mg  2.5 mg Inhalation Q4H PRN Margaretmary Dys, MD       aspirin EC tablet 81 mg  81 mg Oral Daily Sowell, Apolinar Junes, MD       cyclobenzaprine (FLEXERIL) tablet 10 mg  10 mg Oral QHS Margaretmary Dys, MD   10 mg at 01/12/23 2344   pantoprozole (PROTONIX) 80 mg /NS 100 mL infusion  8 mg/hr Intravenous Continuous Margaretmary Dys, MD 10 mL/hr at 01/13/23 0833 8 mg/hr at 01/13/23 0833   polyethylene glycol (MIRALAX / GLYCOLAX) packet 17 g  17 g Oral  Daily PRN Margaretmary Dys, MD       sodium chloride flush (NS) 0.9 % injection 3 mL  3 mL Intravenous Once Margaretmary Dys, MD       Current Outpatient Medications  Medication Sig Dispense Refill   acetaminophen (TYLENOL) 325 MG tablet Take 2 tablets (650 mg total) by mouth every 6 (six) hours as needed (or Fever >/= 101).     albuterol (VENTOLIN HFA) 108 (  90 Base) MCG/ACT inhaler Inhale 2 puffs into the lungs every 4 (four) hours as needed for wheezing or shortness of breath. 18 g 0   amLODipine (NORVASC) 10 MG tablet Take 10 mg by mouth daily.     cyclobenzaprine (FLEXERIL) 10 MG tablet Take 10 mg by mouth at bedtime.     hydrochlorothiazide (HYDRODIURIL) 25 MG tablet Take 12.5 mg by mouth daily.     metoprolol succinate (TOPROL-XL) 25 MG 24 hr tablet Take 25 mg by mouth daily.     naproxen sodium (ALEVE) 220 MG tablet Take 220 mg by mouth in the morning and at bedtime.     omeprazole (PRILOSEC) 20 MG capsule Take 20 mg by mouth daily.     XARELTO 20 MG TABS tablet Take 20 mg by mouth daily.      Allergies as of 01/12/2023 - Review Complete 01/12/2023  Allergen Reaction Noted   Penicillins Swelling 03/24/2012   Codeine Nausea And Vomiting 03/24/2012   Erythromycin  04/17/2012   Sulfa antibiotics  04/17/2012    Social History   Socioeconomic History   Marital status: Widowed    Spouse name: Not on file   Number of children: Not on file   Years of education: Not on file   Highest education level: Not on file  Occupational History   Not on file  Tobacco Use   Smoking status: Never   Smokeless tobacco: Never  Vaping Use   Vaping status: Never Used  Substance and Sexual Activity   Alcohol use: Yes    Comment: occasional   Drug use: No   Sexual activity: Not on file  Other Topics Concern   Not on file  Social History Narrative   Not on file   Social Determinants of Health   Financial Resource Strain: Not on file  Food Insecurity: Not on  file  Transportation Needs: Not on file  Physical Activity: Not on file  Stress: Not on file  Social Connections: Not on file  Intimate Partner Violence: Not on file     Code Status   Code Status: Full Code  Review of Systems: All systems reviewed and negative except where noted in HPI.  Physical Exam: Vital signs in last 24 hours: Temp:  [97.8 F (36.6 C)-98.4 F (36.9 C)] 98 F (36.7 C) (09/25 0836) Pulse Rate:  [85-118] 91 (09/25 0830) Resp:  [11-28] 18 (09/25 0830) BP: (110-157)/(37-94) 136/54 (09/25 0800) SpO2:  [95 %-100 %] 100 % (09/25 0830) Weight:  [133.8 kg] 133.8 kg (09/24 1457)    General:  Pleasant female in NAD Psych:  Cooperative. Emotional.  Eyes: Pupils equal Ears:  Normal auditory acuity Nose: No deformity, discharge or lesions Neck:  Supple, no masses felt Lungs:  Bibasilar crackles. .  Heart:  Regular rate, regular rhythm.  Abdomen:  Soft, nondistended, nontender, active bowel sounds, no masses felt Rectal :  Deferred Msk: Symmetrical without gross deformities.  Neurologic:  Alert, oriented, grossly normal neurologically Extremities : No edema Skin:  Intact without significant lesions.    Intake/Output from previous day: 09/24 0701 - 09/25 0700 In: 415 [Blood:315; IV Piggyback:100] Out: -  Intake/Output this shift:  No intake/output data recorded.  Principal Problem:   Acute ischemic stroke [facility]) Active Problems:   Symptomatic anemia   GI bleed   Asymptomatic bacteriuria    Willette Cluster, NP-C   01/13/2023, 9:05 AM

## 2023-01-13 NOTE — Progress Notes (Addendum)
STROKE TEAM PROGRESS NOTE   BRIEF HPI [name] is a 67 y.o. female with history of obesity with a BMI of 47.61, COPD, acute on chronic anemia, AF on Xarelto, and essential hypertension presenting with a brief episode of slurred speech the morning of 9/24, trouble typing at work with frequent misspellings (felt to be at least mechanical due to a new, unfamiliar keyboard as patient recognized misspellings and took the time to fix them, patient was not using the wrong words), and a fall to sitting on the floor as she missed her chair at a salon without striking her head but due to oral anticoagulation, she was directed to be assessed in the ED.   SIGNIFICANT HOSPITAL EVENTS 9/24: ED work up significant for anemia with a Hgb of 7.0, guaiac +, findings consistent with a UTI, and an MRI brain with a small right frontal corona radiata stroke. Patient was started on Rocephin, GI and neurology consulted, 1 unit PRBC administered, and she was admitted for further evaluation.  INTERIM HISTORY/SUBJECTIVE Patient laying in ER stretcher, husband at bedside.  Patient is in no acute distress.  Vital signs are stable.  Neuroexam is nonfocal.  Patient denies any recurring or residual symptoms since hospital arrival.  Patient does state that her Xarelto is a considerable expense for her.  Patient Xarelto has been held in the setting of possible GI bleed with + guaiac in ED and anemia with Hgb of 7.0.   OBJECTIVE  CBC    Component Value Date/Time   WBC 7.9 01/13/2023 0338   RBC 3.19 (L) 01/13/2023 0338   HGB 7.5 (L) 01/13/2023 0338   HGB 7.6 (L) 01/13/2023 0338   HCT 26.2 (L) 01/13/2023 0338   PLT 323 01/13/2023 0338   MCV 82.1 01/13/2023 0338   MCH 23.8 (L) 01/13/2023 0338   MCHC 29.0 (L) 01/13/2023 0338   RDW 15.9 (H) 01/13/2023 0338   LYMPHSABS 1.6 01/12/2023 1530   MONOABS 0.7 01/12/2023 1530   EOSABS 0.4 01/12/2023 1530   BASOSABS 0.0 01/12/2023 1530   BMET    Component Value  Date/Time   NA 140 01/13/2023 0338   K 3.5 01/13/2023 0338   CL 106 01/13/2023 0338   CO2 26 01/13/2023 0338   GLUCOSE 127 (H) 01/13/2023 0338   BUN 21 01/13/2023 0338   CREATININE 1.01 (H) 01/13/2023 0338   CALCIUM 8.8 (L) 01/13/2023 0338   GFRNONAA >60 01/13/2023 0338   IMAGING past 24 hours CT ANGIO HEAD NECK W WO CM  Result Date: 01/13/2023 CLINICAL DATA:  Stroke, determine embolic source EXAM: CT ANGIOGRAPHY HEAD AND NECK WITH AND WITHOUT CONTRAST TECHNIQUE: Multidetector CT imaging of the head and neck was performed using the standard protocol during bolus administration of intravenous contrast. Multiplanar CT image reconstructions and MIPs were obtained to evaluate the vascular anatomy. Carotid stenosis measurements (when applicable) are obtained utilizing NASCET criteria, using the distal internal carotid diameter as the denominator. RADIATION DOSE REDUCTION: This exam was performed according to the departmental dose-optimization program which includes automated exposure control, adjustment of the mA and/or kV according to patient size and/or use of iterative reconstruction technique. CONTRAST:  75mL OMNIPAQUE IOHEXOL 350 MG/ML SOLN COMPARISON:  Brain MRI from earlier today FINDINGS: CTA NECK FINDINGS Aortic arch: Atheromatous plaque with 2 vessel branching Right carotid system: Mild atheromatous plaque at the bifurcation. No stenosis or ulceration Left carotid system: Unremarkable Vertebral arteries: No proximal subclavian stenosis. The left vertebral artery arises from essentially the left  subclavian origin. Based on sagittal images left vertebral were gin stenosis measures proximally 30%. Skeleton: Hyperostosis interna Other neck: Negative Upper chest: Large main pulmonary artery suggesting pulmonary hypertension, partially covered but measuring at least 3.6 cm. Review of the MIP images confirms the above findings CTA HEAD FINDINGS Anterior circulation: Atheromatous plaque affects the  carotid siphons. No branch occlusion, beading, or aneurysm. Minimal atheromatous irregularity of intracranial branches. Posterior circulation: The vertebral and basilar arteries are smoothly contoured and diffusely patent. No branch occlusion, beading, or aneurysm. Mild atheromatous irregularity asymmetrically affecting the bilateral PCA. Venous sinuses: Diffusely patent Anatomic variants: None significant Review of the MIP images confirms the above findings IMPRESSION: No emergent finding. No flow limiting stenosis or vessel irregularity underlying the patient's white matter infarct. Electronically Signed   By: Tiburcio Pea M.D.   On: 01/13/2023 04:38   MR BRAIN WO CONTRAST  Result Date: 01/13/2023 CLINICAL DATA:  Initial evaluation for neuro deficit, stroke suspected. EXAM: MRI HEAD WITHOUT CONTRAST TECHNIQUE: Multiplanar, multiecho pulse sequences of the brain and surrounding structures were obtained without intravenous contrast. COMPARISON:  Prior CT from 01/12/2023. FINDINGS: Brain: Generalized age-related cerebral atrophy. Patchy and confluent T2/FLAIR hyperintensity involving the periventricular deep white matter both cerebral hemispheres as well as the pons, consistent with chronic small vessel ischemic disease, moderate to advanced in nature. Few scatter remote lacunar infarcts present about the bilateral basal ganglia and left hemispheric cerebral white matter. 1.1 cm focus of restricted diffusion involving the right frontal corona radiata, consistent with an acute ischemic infarct (series 5, image 78). No associated hemorrhage or mass effect. No other evidence for acute or subacute ischemia. Gray-white matter differentiation otherwise maintained. No acute intracranial hemorrhage. Multiple scattered chronic micro hemorrhages noted, many which are peripheral location, and could be due to cerebral amyloid angiopathy. No mass lesion, midline shift or mass effect. No hydrocephalus or extra-axial fluid  collection. Pituitary gland and suprasellar region within normal limits. Vascular: Major intracranial vascular flow voids are maintained. Skull and upper cervical spine: Craniocervical junction within normal limits. Bone marrow signal intensity diffusely heterogeneous. No visible focal marrow replacing lesion. Hyperostosis frontalis interna noted. No scalp soft tissue abnormality. Sinuses/Orbits: Prior bilateral ocular lens replacement. Paranasal sinuses are largely clear. No significant mastoid effusion. Other: None. IMPRESSION: 1. 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 2. Underlying age-related cerebral atrophy with fairly advanced chronic microvascular ischemic disease. 3. Multiple scattered chronic micro hemorrhages. Overall pattern is nonspecific, with primary differential considerations including sequelae of cerebral amyloid angiopathy or hypertension. Electronically Signed   By: Rise Mu M.D.   On: 01/13/2023 01:53   CT HEAD WO CONTRAST  Result Date: 01/12/2023 CLINICAL DATA:  Slurred speech EXAM: CT HEAD WITHOUT CONTRAST TECHNIQUE: Contiguous axial images were obtained from the base of the skull through the vertex without intravenous contrast. RADIATION DOSE REDUCTION: This exam was performed according to the departmental dose-optimization program which includes automated exposure control, adjustment of the mA and/or kV according to patient size and/or use of iterative reconstruction technique. COMPARISON:  None Available. FINDINGS: Brain: No territorial infarction, hemorrhage, or intracranial mass. Small age indeterminate lacunar infarct within the left periventricular white matter. Atrophy. Mild chronic small vessel ischemic changes of the white matter. Nonenlarged ventricles Vascular: No hyperdense vessels.  Carotid vascular calcification Skull: Normal. Negative for fracture or focal lesion. Sinuses/Orbits: No acute finding. Other: None  IMPRESSION: 1. 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 2. Atrophy and mild chronic small  vessel ischemic changes of the white matter. Electronically Signed   By: Jasmine Pang M.D.   On: 01/12/2023 19:39   DG Chest Portable 1 View  Result Date: 01/12/2023 CLINICAL DATA:  Chest pain EXAM: PORTABLE CHEST 1 VIEW COMPARISON:  11/18/2022 FINDINGS: Low lung volumes. Cardiomegaly with central congestion. No acute airspace disease, pleural effusion, or pneumothorax. Patchy atelectasis left base. IMPRESSION: Low lung volumes with cardiomegaly and central congestion. Electronically Signed   By: Jasmine Pang M.D.   On: 01/12/2023 19:32    Vitals:   01/13/23 0815 01/13/23 0830 01/13/23 0836 01/13/23 0919  BP:    (!) 138/48  Pulse: 88 91  90  Resp: 17 18  17   Temp:   98 F (36.7 C) 97.8 F (36.6 C)  TempSrc:    Oral  SpO2: 100% 100%  99%  Weight:      Height:       PHYSICAL EXAM General:  Alert,  obese Caucasian female in no acute distress Psych:  Mood and affect appropriate for situation, calm and cooperative with examination CV: Irregular rate and rhythm on cardiac monitor Respiratory:  Regular, unlabored respirations on room air GI: Abdomen soft and nontender  NEURO:  Mental Status: AA&Ox3, patient is able to give clear and coherent history Speech/Language: speech is without dysarthria or aphasia.  Naming, repetition, fluency, and comprehension intact.  Cranial Nerves:  II: PERRL. Visual fields full.  III, IV, VI: EOMI without gaze preference, nystagmus, or ptosis. V: Sensation is intact to light touch and symmetrical to face.  VII: Face is symmetrical resting and with movement VIII: Hearing intact to voice. IX, X: Palate elevates symmetrically. Phonation is normal.  IH:[id number] shrug 5/5. XII: tongue is midline without fasciculations. Motor: 5/5 strength to all muscle groups tested.  Tone: is normal and bulk is normal Sensation: Intact to light  touch bilaterally. Extinction absent to light touch to DSS.   Coordination: FTN intact bilaterally, HKS: no ataxia in BLE. No drift.  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  Atrophy and mild chronic small vessel ischemic changes of the white matter. CTA head & neck 9/25: No emergent finding.  No flow-limiting stenosis or vessel irregularity underlying the patient's white matter infarct. MRI 9/25: 1.1 cm acute ischemic nonhemorrhagic infarct involving the right frontal corona radiata.  Underlying age-related cerebral atrophy with fairly advanced chronic microvascular ischemic disease.  Multiple scattered chronic microhemorrhages.   2D Echo ordered, pending Consider watchman placement outpatient Will need sleep study outpatient LDL 78 HgbA1c 5.1% VTE prophylaxis -SCDs in the setting of possible GUB with + FOBT Xarelto (rivaroxaban) daily prior to admission, now on No antithrombotic. Consider transition to Eliquis for Xarelto failure when cleared by GI.  Alternative anticoagulation therapies discussed briefly, will need further discussion of alternatives when patient is cleared. Therapy recommendations: None, no deficits on exam Disposition:  pending  Atrial fibrillation Home Meds: Xarelto, last dose 9/23 due to symptomatic anemia and + FOBT in ED  Continue telemetry monitoring Resume anticoagulation when cleared by GI  Hypertension Home meds: Norvasc, hydrochlorothiazide, metoprolol Stable  Gradual normotension  Hyperlipidemia Home meds:  none LDL 78, goal < 70 Add atorvastatin 40 mg PO daily  Continue statin at discharge  Other Stroke Risk Factors Obesity, Body mass index  is 47.61 kg/m.,  BMI >/= 30 associated with increased stroke risk, recommend weight loss, diet and exercise as appropriate   Other Active Problems Acute UTI On Rocephin FOBT+  GI consulted, will need endoscopic evaluation NPO at midnight for endoscopy planned 9/26 Anemia (acute on chronic) FOBT+ S/p 1 unit PRBC Hgb 7.0 > 7.5 GI on board as above Low serum iron, ferritin Serum B12 pending with history of pernicious anemia   Hospital day # 1  Lanae Boast, AGACNP-BC [facility] Pager: [phone] I have personally obtained history,examined this patient, reviewed notes, independently viewed imaging studies, participated in medical decision making and plan of care.ROS completed by me personally and pertinent positives fully documented  I have made any additions or clarifications directly to the above note. Agree with note above.  Patient presented with transient speech and language difficulties and MRI shows right subcortical infarct likely from small vessel disease though she has A-fib and was on anticoagulation with Xarelto.  She also has a GI bleed and hence Xarelto is on temporary hold.  Continue ongoing stroke workup and aggressive risk factor modification.  Resume anticoagulation when okay with GI.  Patient may not be a good long-term anticoagulation candidate and will benefit with consideration for Watchman device as an outpatient.  Long discussion with patient and answered questions.  Greater than 50% time during this 50-minute visit was spent in counseling and coordination of care and discussion risk-benefit of anticoagulation and Watchman device and atrial fibrillation  Delia Heady, MD Medical Director [facility] Pager: [phone] 01/13/2023 2:45 PM  To contact Stroke Continuity provider, please refer to [email or web address]. After hours, contact General Neurology

## 2023-01-13 NOTE — Anesthesia Preprocedure Evaluation (Addendum)
Anesthesia Evaluation  Patient identified by MRN, date of birth, ID band Patient awake    Reviewed: Allergy & Precautions, NPO status , Patient's Chart, lab work & pertinent test results  History of Anesthesia Complications Negative for: history of anesthetic complications  Airway Mallampati: IV  TM Distance: <3 FB Neck ROM: Full    Dental  (+) Dental Advisory Given   Pulmonary neg pulmonary ROS   breath sounds clear to auscultation       Cardiovascular hypertension, Pt. on medications and Pt. on home beta blockers  Rhythm:Regular Rate:Normal     Neuro/Psych CVA  negative psych ROS   GI/Hepatic Neg liver ROS,GERD  Medicated,,  Endo/Other    Morbid obesity  Renal/GU negative Renal ROS     Musculoskeletal negative musculoskeletal ROS (+)    Abdominal   Peds  Hematology  (+) Blood dyscrasia, anemia  On xarelto    Anesthesia Other Findings   Reproductive/Obstetrics                             Anesthesia Physical Anesthesia Plan  ASA: 4  Anesthesia Plan: MAC   Post-op Pain Management: Minimal or no pain anticipated   Induction:   PONV Risk Score and Plan: 2 and Propofol infusion and Treatment may vary due to age or medical condition  Airway Management Planned: Nasal Cannula and Natural Airway  Additional Equipment: None  Intra-op Plan:   Post-operative Plan:   Informed Consent: I have reviewed the patients History and Physical, chart, labs and discussed the procedure including the risks, benefits and alternatives for the proposed anesthesia with the patient or authorized representative who has indicated his/her understanding and acceptance.       Plan Discussed with: CRNA and Anesthesiologist  Anesthesia Plan Comments:         Anesthesia Quick Evaluation

## 2023-01-14 ENCOUNTER — Inpatient Hospital Stay (HOSPITAL_COMMUNITY): Payer: Medicare HMO

## 2023-01-14 DIAGNOSIS — K922 Gastrointestinal hemorrhage, unspecified: Secondary | ICD-10-CM

## 2023-01-14 DIAGNOSIS — D649 Anemia, unspecified: Secondary | ICD-10-CM | POA: Diagnosis not present

## 2023-01-14 DIAGNOSIS — I6389 Other cerebral infarction: Secondary | ICD-10-CM

## 2023-01-14 LAB — URINE CULTURE: Culture: 40000 — AB

## 2023-01-14 LAB — TYPE AND SCREEN
ABO/RH(D): O POS
Antibody Screen: NEGATIVE
Unit division: 0
Unit division: 0

## 2023-01-14 LAB — ECHOCARDIOGRAM COMPLETE
AR max vel: 1.09 cm2
AV Area VTI: 1.29 cm2
AV Area mean vel: 1.06 cm2
AV Mean grad: 19 mmHg
AV Peak grad: 33.2 mmHg
Ao pk vel: 2.88 m/s
Area-P 1/2: 2.99 cm2
Height: 66 in
S' Lateral: 2.8 cm
Weight: 4719.61 oz

## 2023-01-14 LAB — CBC
HCT: 27.1 % — ABNORMAL LOW (ref 36.0–46.0)
HCT: 28.5 % — ABNORMAL LOW (ref 36.0–46.0)
Hemoglobin: 8 g/dL — ABNORMAL LOW (ref 12.0–15.0)
Hemoglobin: 8.3 g/dL — ABNORMAL LOW (ref 12.0–15.0)
MCH: 24.6 pg — ABNORMAL LOW (ref 26.0–34.0)
MCH: 24 pg — ABNORMAL LOW (ref 26.0–34.0)
MCHC: 29.5 g/dL — ABNORMAL LOW (ref 30.0–36.0)
MCHC: 29.1 g/dL — ABNORMAL LOW (ref 30.0–36.0)
MCV: 83.4 fL (ref 80.0–100.0)
MCV: 82.4 fL (ref 80.0–100.0)
Platelets: 261 10*3/uL (ref 150–400)
Platelets: 282 10*3/uL (ref 150–400)
RBC: 3.25 MIL/uL — ABNORMAL LOW (ref 3.87–5.11)
RBC: 3.46 MIL/uL — ABNORMAL LOW (ref 3.87–5.11)
RDW: 16 % — ABNORMAL HIGH (ref 11.5–15.5)
RDW: 16.3 % — ABNORMAL HIGH (ref 11.5–15.5)
WBC: 5.7 10*3/uL (ref 4.0–10.5)
WBC: 6.4 10*3/uL (ref 4.0–10.5)
nRBC: 0 % (ref 0.0–0.2)
nRBC: 0 % (ref 0.0–0.2)

## 2023-01-14 LAB — BASIC METABOLIC PANEL
Anion gap: 9 (ref 5–15)
BUN: 15 mg/dL (ref 8–23)
CO2: 24 mmol/L (ref 22–32)
Calcium: 8.6 mg/dL — ABNORMAL LOW (ref 8.9–10.3)
Chloride: 106 mmol/L (ref 98–111)
Creatinine, Ser: 1.06 mg/dL — ABNORMAL HIGH (ref 0.44–1.00)
GFR, Estimated: 58 mL/min — ABNORMAL LOW (ref >60)
Glucose, Bld: 120 mg/dL — ABNORMAL HIGH (ref 70–99)
Potassium: 3.5 mmol/L (ref 3.5–5.1)
Sodium: 139 mmol/L (ref 135–145)

## 2023-01-14 LAB — BPAM RBC
Blood Product Expiration Date: 202410202359
Blood Product Expiration Date: 202410212359
ISSUE DATE / TIME: 202409241929
ISSUE DATE / TIME: 202409250605
Unit Type and Rh: 5100
Unit Type and Rh: 5100

## 2023-01-14 LAB — CULTURE, BLOOD (ROUTINE X 2)

## 2023-01-14 MED ORDER — PEG-KCL-NACL-NASULF-NA ASC-C 100 G PO SOLR
1.0000 | Freq: Once | ORAL | Status: AC
  Administered 2023-01-14: 200 g via ORAL
  Filled 2023-01-14: qty 1

## 2023-01-14 MED ORDER — SODIUM CHLORIDE 0.9 % IV SOLN: INTRAVENOUS | Status: DC

## 2023-01-14 MED ORDER — ONDANSETRON HCL 4 MG/2ML IJ SOLN
4.0000 mg | Freq: Three times a day (TID) | INTRAMUSCULAR | Status: DC | PRN
  Administered 2023-01-14: 4 mg via INTRAVENOUS
  Filled 2023-01-14 (×2): qty 2

## 2023-01-14 MED ORDER — ACETAMINOPHEN 10 MG/ML IV SOLN
1000.0000 mg | Freq: Once | INTRAVENOUS | Status: AC
  Administered 2023-01-14: 1000 mg via INTRAVENOUS
  Filled 2023-01-14: qty 100

## 2023-01-14 NOTE — Assessment & Plan Note (Addendum)
UA demonstrated many bacteria, large leukocytes, positive nitrites, small hematuria, proteinuria 30.  No complaints of dysuria or increased urinary frequency. - s/p 1 dose Rocephin in the ED, no need for more abx at this time - discontinued urine culture

## 2023-01-14 NOTE — Assessment & Plan Note (Addendum)
CT head w/o contrast demonstrated age indeterminate hypodensity/lacunar infarct within the left white matter. MR brain w/o contrast 1.1cm acute ischemic nonhemorrhagic infarct involving the R frontal corona radiata. Lipid panel demonstrated LDL 78, HDL 35, and TG 106, will start statin therapy. A1c well controlled at 5.1. Passed swallow study, will change diet depending on GI. - Atorvastatin 40 mg - Continue ASA 81 mg - Will restart Xarelto depending on GI recs - Permissive HTN per neuro - Neuro checks q4 - Cardiac monitoring - PT/OT - Neuro on board, appreciate recs

## 2023-01-14 NOTE — Progress Notes (Signed)
PT Cancellation Note  Patient Details Name: Megan Espinoza MRN: [id number] DOB: May 20, 1955   Cancelled Treatment:    Reason Eval/Treat Not Completed: Patient declined, no reason specified,  Pt states she wants to "settle down" before her procedure.  Will try back later after her procedure as able. 01/14/2023  Jacinto Halim., PT Acute Rehabilitation Services [phone]  (office)   Eliseo Gum Dominique Calvey 01/14/2023, 10:43 AM

## 2023-01-14 NOTE — Progress Notes (Addendum)
STROKE TEAM PROGRESS NOTE   BRIEF HPI [name] is a 67 y.o. female with history of obesity with a BMI of 47.61, COPD, acute on chronic anemia, AF on Xarelto, and essential hypertension presenting with a brief episode of slurred speech the morning of 9/24, trouble typing at work with frequent misspellings (felt to be at least mechanical due to a new, unfamiliar keyboard as patient recognized misspellings and took the time to fix them, patient was not using the wrong words), and a fall to sitting on the floor as she missed her chair at a salon without striking her head but due to oral anticoagulation, she was directed to be assessed in the ED.   SIGNIFICANT HOSPITAL EVENTS 9/24: ED work up significant for anemia with a Hgb of 7.0, guaiac +, findings consistent with a UTI, and an MRI brain with a small right frontal corona radiata stroke. Patient was started on Rocephin, GI and neurology consulted, 1 unit PRBC administered, and she was admitted for further evaluation.  INTERIM HISTORY/SUBJECTIVE Patient is seen in her room with her husband at the bedside.  She is tearful and anxious and voices distress regarding her bowel prep for colonoscopy today.  She has been hemodynamically stable, and hemoglobin is stable at 8.0.  OBJECTIVE  CBC    Component Value Date/Time   WBC 5.7 01/14/2023 0339   RBC 3.25 (L) 01/14/2023 0339   HGB 8.0 (L) 01/14/2023 0339   HCT 27.1 (L) 01/14/2023 0339   PLT 261 01/14/2023 0339   MCV 83.4 01/14/2023 0339   MCH 24.6 (L) 01/14/2023 0339   MCHC 29.5 (L) 01/14/2023 0339   RDW 16.0 (H) 01/14/2023 0339   LYMPHSABS 1.6 01/12/2023 1530   MONOABS 0.7 01/12/2023 1530   EOSABS 0.4 01/12/2023 1530   BASOSABS 0.0 01/12/2023 1530   BMET    Component Value Date/Time   NA 139 01/14/2023 0339   K 3.5 01/14/2023 0339   CL 106 01/14/2023 0339   CO2 24 01/14/2023 0339   GLUCOSE 120 (H) 01/14/2023 0339   BUN 15 01/14/2023 0339   CREATININE 1.06 (H) 01/14/2023  0339   CALCIUM 8.6 (L) 01/14/2023 0339   GFRNONAA 58 (L) 01/14/2023 0339   IMAGING past 24 hours No results found.  Vitals:   01/13/23 2050 01/13/23 2348 01/14/23 0804 01/14/23 1100  BP: 128/62 (!) 145/75 114/61 119/62  Pulse: 100 100 93 95  Resp: 18 18    Temp: 98 F (36.7 C) 98 F (36.7 C) 98.2 F (36.8 C) 98.5 F (36.9 C)  TempSrc: Oral Oral Oral Oral  SpO2: 90% 90% 95%   Weight:      Height:       PHYSICAL EXAM General:  Alert,  obese Caucasian female in no acute distress Psych: Anxious and tearful CV: Irregular rate and rhythm on cardiac monitor Respiratory:  Regular, unlabored respirations on room air GI: Abdomen soft and nontender  NEURO:  Mental Status: AA&Ox3, patient is able to give some history Speech/Language: speech is without dysarthria or aphasia.  ]  Cranial Nerves:  II: PERRL. ] III, IV, VI: EOMI without gaze preference, nystagmus, or ptosis. VII: Face is symmetrical resting and with movement VIII: Hearing intact to voice. IX, X: Phonation is normal.  XII: tongue is midline without fasciculations. Motor: 5/5 strength to all muscle groups tested.  Tone: is normal and bulk is normal Sensation: Intact to light touch bilaterally.    Gait: Deferred  ASSESSMENT/PLAN  Acute Ischemic Infarct:  Small right frontal corona radiata infarction,   Etiology: likely small vessel disease secondary to longstanding hypertension as well as atrial fibrillation on anticoagulation with Xarelto CT head 9/24: Age-indeterminate hypodensity/lacunar infarct in the left white matter.  Atrophy and mild chronic small vessel ischemic changes of the white matter. CTA head & neck 9/25: No emergent finding.  No flow-limiting stenosis or vessel irregularity underlying the patient's white matter infarct. MRI 9/25: 1.1 cm acute ischemic nonhemorrhagic infarct involving the right frontal corona radiata.  Underlying age-related cerebral atrophy with fairly advanced chronic microvascular  ischemic disease.  Multiple scattered chronic microhemorrhages.   2D Echo ordered, pending Consider watchman placement outpatient Will need sleep study outpatient LDL 78 HgbA1c 5.1% VTE prophylaxis -SCDs in the setting of possible GUB with + FOBT Xarelto (rivaroxaban) daily prior to admission, now on No antithrombotic. Consider transition to Eliquis for Xarelto failure when cleared by GI.  Alternative anticoagulation therapies discussed briefly, will need further discussion of alternatives when patient is cleared. Therapy recommendations: None, no deficits on exam Disposition:  pending  Atrial fibrillation Home Meds: Xarelto, last dose 9/23 due to symptomatic anemia and + FOBT in ED  Continue telemetry monitoring Resume anticoagulation when cleared by GI  Hypertension Home meds: Norvasc, hydrochlorothiazide, metoprolol Stable  Gradual normotension  Hyperlipidemia Home meds:  none LDL 78, goal < 70 Add atorvastatin 40 mg PO daily  Continue statin at discharge  Other Stroke Risk Factors Obesity, Body mass index is 47.61 kg/m., BMI >/= 30 associated with increased stroke risk, recommend weight loss, diet and exercise as appropriate   Other Active Problems Acute UTI On Rocephin FOBT+  GI consulted, will need endoscopic evaluation NPO at midnight for endoscopy planned 9/26 Anemia (acute on chronic) FOBT+ S/p 1 unit PRBC Hgb 7.0 > 7.5 -> 8.0 GI on board as above Low serum iron, ferritin Serum B12 pending with history of pernicious anemia   Hospital day # 2  Patient seen by NP and then by MD, MD to edit note as needed. Cortney E Ernestina Columbia , MSN, AGACNP-BC [facility] See Amion for schedule and pager information 01/14/2023 1:05 PM  I have personally obtained history,examined this patient, reviewed notes, independently viewed imaging studies, participated in medical decision making and plan of care.ROS completed by me personally and pertinent positives fully  documented  I have made any additions or clarifications directly to the above note. Agree with note above.  Awaiting colonoscopy hopefully tomorrow.  Delia Heady, MD Medical Director [facility] Pager: [phone] 01/14/2023 4:29 PM   To contact Stroke Continuity provider, please refer to [email or web address]. After hours, contact General Neurology

## 2023-01-14 NOTE — Progress Notes (Signed)
OT Cancellation Note  Patient Details Name: Megan Espinoza MRN: [id number] DOB: Jan 23, 1956   Cancelled Treatment:    Reason Eval/Treat Not Completed: Patient declined, no reason specified (Pt scheduled for colonoscopy today. Pt declining skilled OT eval at this time due to upcoming procedure. OT to reattempt to see pt later this day as appropriate/available.)  Rosanne Sack "Orson Eva., OTR/L, MA Acute Rehab [phone]  Lendon Colonel 01/14/2023, 11:01 AM

## 2023-01-14 NOTE — H&P (View-Only) (Signed)
Progress Note   Subjective  Chief Complaint: Anemia  Plans were for EGD and colonoscopy today but patient was unable to finish her bowel prep and was not running clear per nursing staff this morning.  I did pass by to see her to discuss doing another round of bowel prep today and staying on clears with procedures tomorrow.  She became tearful and her husband became angry.  They are very concerned that she has been on clears for 3 days now and she is not getting adequate nutrition.  "She cannot live like this".  She tells me she cannot tolerate any more bowel prep because it started to come back up.  Explained that the only way to do procedures is to make sure that she is cleaned out.  They want to "speak with my supervisor".   Objective   Vital signs in last 24 hours: Temp:  [97.8 F (36.6 C)-98.3 F (36.8 C)] 98.2 F (36.8 C) (09/26 0804) Pulse Rate:  [92-100] 93 (09/26 0804) Resp:  [16-19] 18 (09/25 2348) BP: (114-145)/(57-75) 114/61 (09/26 0804) SpO2:  [90 %-100 %] 95 % (09/26 0804) Last BM Date : 01/13/23 General:    Elderly overweight white female in NAD Heart:  Regular rate and rhythm; no murmurs Lungs: Respirations even and unlabored, lungs CTA bilaterally Abdomen:  Soft, nontender and nondistended. Normal bowel sounds. Psych:  Cooperative. Normal mood and affect.   Lab Results: Recent Labs    01/12/23 1530 01/12/23 1545 01/13/23 0338 01/13/23 1154 01/14/23 0339  WBC 9.2  --  7.9  --  5.7  HGB 7.0*   < > 7.5*  7.6* 8.4* 8.0*  HCT 24.4*   < > 26.2* 28.1* 27.1*  PLT 342  --  323  --  261   < > = values in this interval not displayed.   BMET Recent Labs    01/12/23 1530 01/12/23 1545 01/13/23 0338 01/14/23 0339  NA 139 140 140 139  K 3.7 3.8 3.5 3.5  CL 104 108 106 106  CO2 25  --  26 24  GLUCOSE 110* 111* 127* 120*  BUN 23 24* 21 15  CREATININE 1.02* 1.00 1.01* 1.06*  CALCIUM 8.8*  --  8.8* 8.6*   LFT Recent Labs    01/12/23 1530  PROT 6.4*   ALBUMIN 2.8*  AST 18  ALT 14  ALKPHOS 96  BILITOT 0.5   PT/INR Recent Labs    01/12/23 1530  LABPROT 15.4*  INR 1.2    Studies/Results: CT ANGIO HEAD NECK W WO CM  Result Date: 01/13/2023 CLINICAL DATA:  Stroke, determine embolic source EXAM: CT ANGIOGRAPHY HEAD AND NECK WITH AND WITHOUT CONTRAST TECHNIQUE: Multidetector CT imaging of the head and neck was performed using the standard protocol during bolus administration of intravenous contrast. Multiplanar CT image reconstructions and MIPs were obtained to evaluate the vascular anatomy. Carotid stenosis measurements (when applicable) are obtained utilizing NASCET criteria, using the distal internal carotid diameter as the denominator. RADIATION DOSE REDUCTION: This exam was performed according to the departmental dose-optimization program which includes automated exposure control, adjustment of the mA and/or kV according to patient size and/or use of iterative reconstruction technique. CONTRAST:  75mL OMNIPAQUE IOHEXOL 350 MG/ML SOLN COMPARISON:  Brain MRI from earlier today FINDINGS: CTA NECK FINDINGS Aortic arch: Atheromatous plaque with 2 vessel branching Right carotid system: Mild atheromatous plaque at the bifurcation. No stenosis or ulceration Left carotid system: Unremarkable Vertebral arteries: No proximal subclavian stenosis. The  left vertebral artery arises from essentially the left subclavian origin. Based on sagittal images left vertebral were gin stenosis measures proximally 30%. Skeleton: Hyperostosis interna Other neck: Negative Upper chest: Large main pulmonary artery suggesting pulmonary hypertension, partially covered but measuring at least 3.6 cm. Review of the MIP images confirms the above findings CTA HEAD FINDINGS Anterior circulation: Atheromatous plaque affects the carotid siphons. No branch occlusion, beading, or aneurysm. Minimal atheromatous irregularity of intracranial branches. Posterior circulation: The vertebral and  basilar arteries are smoothly contoured and diffusely patent. No branch occlusion, beading, or aneurysm. Mild atheromatous irregularity asymmetrically affecting the bilateral PCA. Venous sinuses: Diffusely patent Anatomic variants: None significant Review of the MIP images confirms the above findings IMPRESSION: No emergent finding. No flow limiting stenosis or vessel irregularity underlying the patient's white matter infarct. Electronically Signed   By: Tiburcio Pea M.D.   On: 01/13/2023 04:38   MR BRAIN WO CONTRAST  Result Date: 01/13/2023 CLINICAL DATA:  Initial evaluation for neuro deficit, stroke suspected. EXAM: MRI HEAD WITHOUT CONTRAST TECHNIQUE: Multiplanar, multiecho pulse sequences of the brain and surrounding structures were obtained without intravenous contrast. COMPARISON:  Prior CT from 01/12/2023. FINDINGS: Brain: Generalized age-related cerebral atrophy. Patchy and confluent T2/FLAIR hyperintensity involving the periventricular deep white matter both cerebral hemispheres as well as the pons, consistent with chronic small vessel ischemic disease, moderate to advanced in nature. Few scatter remote lacunar infarcts present about the bilateral basal ganglia and left hemispheric cerebral white matter. 1.1 cm focus of restricted diffusion involving the right frontal corona radiata, consistent with an acute ischemic infarct (series 5, image 78). No associated hemorrhage or mass effect. No other evidence for acute or subacute ischemia. Gray-white matter differentiation otherwise maintained. No acute intracranial hemorrhage. Multiple scattered chronic micro hemorrhages noted, many which are peripheral location, and could be due to cerebral amyloid angiopathy. No mass lesion, midline shift or mass effect. No hydrocephalus or extra-axial fluid collection. Pituitary gland and suprasellar region within normal limits. Vascular: Major intracranial vascular flow voids are maintained. Skull and upper cervical  spine: Craniocervical junction within normal limits. Bone marrow signal intensity diffusely heterogeneous. No visible focal marrow replacing lesion. Hyperostosis frontalis interna noted. No scalp soft tissue abnormality. Sinuses/Orbits: Prior bilateral ocular lens replacement. Paranasal sinuses are largely clear. No significant mastoid effusion. Other: None. IMPRESSION: 1. 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 2. Underlying age-related cerebral atrophy with fairly advanced chronic microvascular ischemic disease. 3. Multiple scattered chronic micro hemorrhages. Overall pattern is nonspecific, with primary differential considerations including sequelae of cerebral amyloid angiopathy or hypertension. Electronically Signed   By: Rise Mu M.D.   On: 01/13/2023 01:53   CT HEAD WO CONTRAST  Result Date: 01/12/2023 CLINICAL DATA:  Slurred speech EXAM: CT HEAD WITHOUT CONTRAST TECHNIQUE: Contiguous axial images were obtained from the base of the skull through the vertex without intravenous contrast. RADIATION DOSE REDUCTION: This exam was performed according to the departmental dose-optimization program which includes automated exposure control, adjustment of the mA and/or kV according to patient size and/or use of iterative reconstruction technique. COMPARISON:  None Available. FINDINGS: Brain: No territorial infarction, hemorrhage, or intracranial mass. Small age indeterminate lacunar infarct within the left periventricular white matter. Atrophy. Mild chronic small vessel ischemic changes of the white matter. Nonenlarged ventricles Vascular: No hyperdense vessels.  Carotid vascular calcification Skull: Normal. Negative for fracture or focal lesion. Sinuses/Orbits: No acute finding. Other: None IMPRESSION: 1. Age indeterminate hypodensity/lacunar infarct within the left white  matter. 2. Atrophy and mild chronic small vessel ischemic changes of the white  matter. Electronically Signed   By: Jasmine Pang M.D.   On: 01/12/2023 19:39   DG Chest Portable 1 View  Result Date: 01/12/2023 CLINICAL DATA:  Chest pain EXAM: PORTABLE CHEST 1 VIEW COMPARISON:  11/18/2022 FINDINGS: Low lung volumes. Cardiomegaly with central congestion. No acute airspace disease, pleural effusion, or pneumothorax. Patchy atelectasis left base. IMPRESSION: Low lung volumes with cardiomegaly and central congestion. Electronically Signed   By: Jasmine Pang M.D.   On: 01/12/2023 19:32       Assessment / Plan:   Assessment: 1.  Anemia: Acute on chronic and FOBT positive on Xarelto and also daily NSAIDs: Concern for PUD/AVM/intestinal neoplasm, hemoglobin initially down to 7 from 11.6 in July--> 2 unit PRBCs--> 8.4 2.  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  A-fib: On Xarelto at home, currently on hold now last dose 9/23 4.  Chronic GERD with pyrosis: Asymptomatic on daily Prilosec 5.  UTI: On Rocephin 6.  Some constipation since starting Xarelto  Plan: 1.  Discussed with patient that she would need to complete another bowel prep in order for [place] to do procedures tomorrow.  She and her husband do not want to proceed if this is the case.  They want her to be able to eat and she wants to go home.  I explained that this would not be the best for her given that she will need to stay on a blood thinner and could continue bleeding and would possibly end up right back here.  They want to speak to my supervisor.  Discussed with Dr. Leonides Schanz who will be by later this afternoon. 2.  Offered the patient Reglan to help with bowel prep.  She still declined. 3.  Further recommendations after speaking with Dr. Leonides Schanz later today.  Thank you for your kind consultation, we will continue to follow along.   LOS: 2 days   Unk Lightning  01/14/2023, 10:45 AM

## 2023-01-14 NOTE — Plan of Care (Signed)
Problem: Education: Goal: Knowledge of General Education information will improve Description: Including pain rating scale, medication(s)/side effects and non-pharmacologic comfort measures Outcome: Progressing   Problem: Health Behavior/Discharge Planning: Goal: Ability to manage health-related needs will improve Outcome: Progressing   Problem: Clinical Measurements: Goal: Ability to maintain clinical measurements within normal limits will improve Outcome: Progressing Goal: Will remain free from infection Outcome: Progressing Goal: Diagnostic test results will improve Outcome: Progressing Goal: Respiratory complications will improve Outcome: Progressing Goal: Cardiovascular complication will be avoided Outcome: Progressing   Problem: Activity: Goal: Risk for activity intolerance will decrease Outcome: Progressing   Problem: Nutrition: Goal: Adequate nutrition will be maintained Outcome: Progressing   Problem: Coping: Goal: Level of anxiety will decrease Outcome: Progressing   Problem: Elimination: Goal: Will not experience complications related to bowel motility Outcome: Progressing Goal: Will not experience complications related to urinary retention Outcome: Progressing   Problem: Pain Managment: Goal: General experience of comfort will improve Outcome: Progressing   Problem: Safety: Goal: Ability to remain free from injury will improve Outcome: Progressing   Problem: Skin Integrity: Goal: Risk for impaired skin integrity will decrease Outcome: Progressing   Problem: Education: Goal: Knowledge of General Education information will improve Description: Including pain rating scale, medication(s)/side effects and non-pharmacologic comfort measures 01/14/2023 0240 by Luther Redo, RN Outcome: Progressing 01/14/2023 0239 by Luther Redo, RN Outcome: Progressing   Problem: Health Behavior/Discharge Planning: Goal: Ability to manage health-related  needs will improve 01/14/2023 0240 by Luther Redo, RN Outcome: Progressing 01/14/2023 0239 by Luther Redo, RN Outcome: Progressing   Problem: Clinical Measurements: Goal: Ability to maintain clinical measurements within normal limits will improve 01/14/2023 0240 by Luther Redo, RN Outcome: Progressing 01/14/2023 0239 by Luther Redo, RN Outcome: Progressing Goal: Will remain free from infection 01/14/2023 0240 by Luther Redo, RN Outcome: Progressing 01/14/2023 0239 by Luther Redo, RN Outcome: Progressing Goal: Diagnostic test results will improve 01/14/2023 0240 by Luther Redo, RN Outcome: Progressing 01/14/2023 0239 by Luther Redo, RN Outcome: Progressing Goal: Respiratory complications will improve 01/14/2023 0240 by Luther Redo, RN Outcome: Progressing 01/14/2023 0239 by Luther Redo, RN Outcome: Progressing Goal: Cardiovascular complication will be avoided 01/14/2023 0240 by Luther Redo, RN Outcome: Progressing 01/14/2023 0239 by Luther Redo, RN Outcome: Progressing   Problem: Activity: Goal: Risk for activity intolerance will decrease 01/14/2023 0240 by Luther Redo, RN Outcome: Progressing 01/14/2023 0239 by Luther Redo, RN Outcome: Progressing   Problem: Nutrition: Goal: Adequate nutrition will be maintained 01/14/2023 0240 by Luther Redo, RN Outcome: Progressing 01/14/2023 0239 by Luther Redo, RN Outcome: Progressing   Problem: Coping: Goal: Level of anxiety will decrease 01/14/2023 0240 by Luther Redo, RN Outcome: Progressing 01/14/2023 0239 by Luther Redo, RN Outcome: Progressing   Problem: Elimination: Goal: Will not experience complications related to bowel motility 01/14/2023 0240 by Luther Redo, RN Outcome: Progressing 01/14/2023 0239 by Luther Redo, RN Outcome: Progressing Goal: Will not experience complications related to  urinary retention 01/14/2023 0240 by Luther Redo, RN Outcome: Progressing 01/14/2023 0239 by Luther Redo, RN Outcome: Progressing   Problem: Pain Managment: Goal: General experience of comfort will improve 01/14/2023 0240 by Luther Redo, RN Outcome: Progressing 01/14/2023 0239 by Luther Redo, RN Outcome: Progressing   Problem: Safety: Goal: Ability to remain free from injury will improve 01/14/2023 0240 by Luther Redo, RN Outcome: Progressing 01/14/2023 0239 by Luther Redo, RN Outcome: Progressing   Problem: Skin Integrity: Goal: Risk for impaired skin integrity will decrease 01/14/2023 0240 by Luther Redo, RN Outcome: Progressing 01/14/2023 0239 by Erma Pinto,  Eduardo Osier, RN Outcome: Progressing

## 2023-01-14 NOTE — Evaluation (Signed)
Physical Therapy Brief Evaluation and Discharge Note Patient Details Name: Megan Espinoza MRN: [id number] DOB: Sep 18, 1955 Today's Date: 01/14/2023   History of Present Illness  67 yo female presents to [facility] on 9/24 with slurred speech, fall at hair salon. Pt found to be anemic with guaiac +, UTI, and MRI with small R frontal corona radiata CVA. PMH includes afib, HTN, UE incoordination, obesity, COPD.  Clinical Impression   Pt presents with baseline level of strength, balance, ROM, and activity tolerance. Pt ambulated around the room with mod I for slightly increased time but no physical assist and pt states she feels at baseline. Pt only declines hallway mobility due to not wanting to go outside of room, states she has no issues with mobility at this time. No focal deficits appreciated on exam, PT did review several BE FAST principles with pt and pt is receptive. Pt with no further acute or post-acute needs at this time.        PT Assessment Patient does not need any further PT services  Assistance Needed at Discharge  None    Equipment Recommendations None recommended by PT  Recommendations for Other Services       Precautions/Restrictions Precautions Precautions: Fall Precaution Comments: fall at nail salon because her shoe got caughter under a chair Restrictions Weight Bearing Restrictions: No        Mobility  Bed Mobility Rolling: Independent Supine/Sidelying to sit: Independent Sit to supine/sidelying: Independent    Transfers Overall transfer level: Independent                      Ambulation/Gait Ambulation/Gait assistance: Modified independent (Device/Increase time) Gait Distance (Feet): 65 Feet Assistive device: None Gait Pattern/deviations: Step-through pattern, WFL(Within Functional Limits) Gait Speed: Below normal (slightly slowed for pt) General Gait Details: slightly increased time, but [facility] parameters  Home Activity Instructions     Stairs            Modified Rankin (Stroke Patients Only)        Balance Overall balance assessment: Modified Independent                        Pertinent Vitals/Pain PT - Brief Vital Signs All Vital Signs Stable: Yes (HR elevation to 119 bpm with gait) Pain Assessment Pain Assessment: No/denies pain     Home Living Family/patient expects to be discharged to:: Private residence Living Arrangements: Spouse/significant other Available Help at Discharge: Family Home Environment: Stairs to enter  [place] of Steps: 3 Home Equipment: Cane - single point;Shower seat        Prior Function Level of Independence: Independent Comments: 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, completely independent    UE/LE Assessment   UE ROM/Strength/Tone/Coordination: WFL    LE ROM/Strength/Tone/Coordination: WFL (5/5 bilat)      Communication   Communication Communication: No apparent difficulties     Cognition Overall Cognitive Status: Appears within functional limits for tasks assessed/performed       General Comments      Exercises     Assessment/Plan    PT Problem List         PT Visit Diagnosis Other abnormalities of gait and mobility (R26.89)    No Skilled PT Patient is modified independent with all activity/mobility   Co-evaluation                AMPAC 6 Clicks Help needed turning from your back to your side while in a flat  bed without using bedrails?: None Help needed moving from lying on your back to sitting on the side of a flat bed without using bedrails?: None Help needed moving to and from a bed to a chair (including a wheelchair)?: None Help needed standing up from a chair using your arms (e.g., wheelchair or bedside chair)?: None Help needed to walk in hospital room?: None Help needed climbing 3-5 steps with a railing? : A Little 6 Click Score: 23      End of Session   Activity Tolerance: Patient tolerated treatment well Patient left:  in bed;with call bell/phone within reach;with family/visitor present Nurse Communication: Mobility status PT Visit Diagnosis: Other abnormalities of gait and mobility (R26.89)     Time: 1437-1450 PT Time Calculation (min) (ACUTE ONLY): 13 min  Charges:   PT Evaluation $PT Eval Low Complexity: 1 Low      [name], PT DPT Acute Rehabilitation Services Secure Chat Preferred  Office [phone]   [name]  01/14/2023, 3:18 PM

## 2023-01-14 NOTE — Care Management Important Message (Signed)
Important Message  Patient Details  Name: AMAIAH VANDERHEIDEN MRN: [id number] Date of Birth: [birth year]   Important Message Given:  Yes - Medicare IM     Sherilyn Banker 01/14/2023, 1:34 PM

## 2023-01-14 NOTE — Assessment & Plan Note (Addendum)
Likely in the setting of GIB due to consistent use of Aleve (for bilateral knee pain) and Xarelto at home, positive FOBT in the ED. No signs of active bleeding noted per patient. Notable iron deficiency anemia Fe 9, Sat ratio 2, Ferritin 9. Hgb upon admission 7.0, improved to 8.4 s/p 2u PRBC, remains stable at 8.0 today. - Monitor with PM CBC - 2 large-bore IVs - Protonix 40 IM BID - GI on board, plan for EGD/Colonoscopy - patient failed bowel prep today, so will plan to attempt another round of bowel prep today & procedures tomorrow - Will discuss restarting Xarelto with GI s/p procedures - NPO at MN pending GI conversation with patient - Consider IV infusions inpatient vs outpatient

## 2023-01-14 NOTE — Progress Notes (Signed)
Progress Note   Subjective  Chief Complaint: Anemia  Plans were for EGD and colonoscopy today but patient was unable to finish her bowel prep and was not running clear per nursing staff this morning.  I did pass by to see her to discuss doing another round of bowel prep today and staying on clears with procedures tomorrow.  She became tearful and her husband became angry.  They are very concerned that she has been on clears for 3 days now and she is not getting adequate nutrition.  "She cannot live like this".  She tells me she cannot tolerate any more bowel prep because it started to come back up.  Explained that the only way to do procedures is to make sure that she is cleaned out.  They want to "speak with my supervisor".   Objective   Vital signs in last 24 hours: Temp:  [97.8 F (36.6 C)-98.3 F (36.8 C)] 98.2 F (36.8 C) (09/26 0804) Pulse Rate:  [92-100] 93 (09/26 0804) Resp:  [16-19] 18 (09/25 2348) BP: (114-145)/(57-75) 114/61 (09/26 0804) SpO2:  [90 %-100 %] 95 % (09/26 0804) Last BM Date : 01/13/23 General:    Elderly overweight white female in NAD Heart:  Regular rate and rhythm; no murmurs Lungs: Respirations even and unlabored, lungs CTA bilaterally Abdomen:  Soft, nontender and nondistended. Normal bowel sounds. Psych:  Cooperative. Normal mood and affect.   Lab Results: Recent Labs    01/12/23 1530 01/12/23 1545 01/13/23 0338 01/13/23 1154 01/14/23 0339  WBC 9.2  --  7.9  --  5.7  HGB 7.0*   < > 7.5*  7.6* 8.4* 8.0*  HCT 24.4*   < > 26.2* 28.1* 27.1*  PLT 342  --  323  --  261   < > = values in this interval not displayed.   BMET Recent Labs    01/12/23 1530 01/12/23 1545 01/13/23 0338 01/14/23 0339  NA 139 140 140 139  K 3.7 3.8 3.5 3.5  CL 104 108 106 106  CO2 25  --  26 24  GLUCOSE 110* 111* 127* 120*  BUN 23 24* 21 15  CREATININE 1.02* 1.00 1.01* 1.06*  CALCIUM 8.8*  --  8.8* 8.6*   LFT Recent Labs    01/12/23 1530  PROT 6.4*   ALBUMIN 2.8*  AST 18  ALT 14  ALKPHOS 96  BILITOT 0.5   PT/INR Recent Labs    01/12/23 1530  LABPROT 15.4*  INR 1.2    Studies/Results: CT ANGIO HEAD NECK W WO CM  Result Date: 01/13/2023 CLINICAL DATA:  Stroke, determine embolic source EXAM: CT ANGIOGRAPHY HEAD AND NECK WITH AND WITHOUT CONTRAST TECHNIQUE: Multidetector CT imaging of the head and neck was performed using the standard protocol during bolus administration of intravenous contrast. Multiplanar CT image reconstructions and MIPs were obtained to evaluate the vascular anatomy. Carotid stenosis measurements (when applicable) are obtained utilizing NASCET criteria, using the distal internal carotid diameter as the denominator. RADIATION DOSE REDUCTION: This exam was performed according to the departmental dose-optimization program which includes automated exposure control, adjustment of the mA and/or kV according to patient size and/or use of iterative reconstruction technique. CONTRAST:  75mL OMNIPAQUE IOHEXOL 350 MG/ML SOLN COMPARISON:  Brain MRI from earlier today FINDINGS: CTA NECK FINDINGS Aortic arch: Atheromatous plaque with 2 vessel branching Right carotid system: Mild atheromatous plaque at the bifurcation. No stenosis or ulceration Left carotid system: Unremarkable Vertebral arteries: No proximal subclavian stenosis. The  left vertebral artery arises from essentially the left subclavian origin. Based on sagittal images left vertebral were gin stenosis measures proximally 30%. Skeleton: Hyperostosis interna Other neck: Negative Upper chest: Large main pulmonary artery suggesting pulmonary hypertension, partially covered but measuring at least 3.6 cm. Review of the MIP images confirms the above findings CTA HEAD FINDINGS Anterior circulation: Atheromatous plaque affects the carotid siphons. No branch occlusion, beading, or aneurysm. Minimal atheromatous irregularity of intracranial branches. Posterior circulation: The vertebral and  basilar arteries are smoothly contoured and diffusely patent. No branch occlusion, beading, or aneurysm. Mild atheromatous irregularity asymmetrically affecting the bilateral PCA. Venous sinuses: Diffusely patent Anatomic variants: None significant Review of the MIP images confirms the above findings IMPRESSION: No emergent finding. No flow limiting stenosis or vessel irregularity underlying the patient's white matter infarct. Electronically Signed   By: Tiburcio Pea M.D.   On: 01/13/2023 04:38   MR BRAIN WO CONTRAST  Result Date: 01/13/2023 CLINICAL DATA:  Initial evaluation for neuro deficit, stroke suspected. EXAM: MRI HEAD WITHOUT CONTRAST TECHNIQUE: Multiplanar, multiecho pulse sequences of the brain and surrounding structures were obtained without intravenous contrast. COMPARISON:  Prior CT from 01/12/2023. FINDINGS: Brain: Generalized age-related cerebral atrophy. Patchy and confluent T2/FLAIR hyperintensity involving the periventricular deep white matter both cerebral hemispheres as well as the pons, consistent with chronic small vessel ischemic disease, moderate to advanced in nature. Few scatter remote lacunar infarcts present about the bilateral basal ganglia and left hemispheric cerebral white matter. 1.1 cm focus of restricted diffusion involving the right frontal corona radiata, consistent with an acute ischemic infarct (series 5, image 78). No associated hemorrhage or mass effect. No other evidence for acute or subacute ischemia. Gray-white matter differentiation otherwise maintained. No acute intracranial hemorrhage. Multiple scattered chronic micro hemorrhages noted, many which are peripheral location, and could be due to cerebral amyloid angiopathy. No mass lesion, midline shift or mass effect. No hydrocephalus or extra-axial fluid collection. Pituitary gland and suprasellar region within normal limits. Vascular: Major intracranial vascular flow voids are maintained. Skull and upper cervical  spine: Craniocervical junction within normal limits. Bone marrow signal intensity diffusely heterogeneous. No visible focal marrow replacing lesion. Hyperostosis frontalis interna noted. No scalp soft tissue abnormality. Sinuses/Orbits: Prior bilateral ocular lens replacement. Paranasal sinuses are largely clear. No significant mastoid effusion. Other: None. IMPRESSION: 1. 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 2. Underlying age-related cerebral atrophy with fairly advanced chronic microvascular ischemic disease. 3. Multiple scattered chronic micro hemorrhages. Overall pattern is nonspecific, with primary differential considerations including sequelae of cerebral amyloid angiopathy or hypertension. Electronically Signed   By: Rise Mu M.D.   On: 01/13/2023 01:53   CT HEAD WO CONTRAST  Result Date: 01/12/2023 CLINICAL DATA:  Slurred speech EXAM: CT HEAD WITHOUT CONTRAST TECHNIQUE: Contiguous axial images were obtained from the base of the skull through the vertex without intravenous contrast. RADIATION DOSE REDUCTION: This exam was performed according to the departmental dose-optimization program which includes automated exposure control, adjustment of the mA and/or kV according to patient size and/or use of iterative reconstruction technique. COMPARISON:  None Available. FINDINGS: Brain: No territorial infarction, hemorrhage, or intracranial mass. Small age indeterminate lacunar infarct within the left periventricular white matter. Atrophy. Mild chronic small vessel ischemic changes of the white matter. Nonenlarged ventricles Vascular: No hyperdense vessels.  Carotid vascular calcification Skull: Normal. Negative for fracture or focal lesion. Sinuses/Orbits: No acute finding. Other: None IMPRESSION: 1. Age indeterminate hypodensity/lacunar infarct within the left white  matter. 2. Atrophy and mild chronic small vessel ischemic changes of the white  matter. Electronically Signed   By: Jasmine Pang M.D.   On: 01/12/2023 19:39   DG Chest Portable 1 View  Result Date: 01/12/2023 CLINICAL DATA:  Chest pain EXAM: PORTABLE CHEST 1 VIEW COMPARISON:  11/18/2022 FINDINGS: Low lung volumes. Cardiomegaly with central congestion. No acute airspace disease, pleural effusion, or pneumothorax. Patchy atelectasis left base. IMPRESSION: Low lung volumes with cardiomegaly and central congestion. Electronically Signed   By: Jasmine Pang M.D.   On: 01/12/2023 19:32       Assessment / Plan:   Assessment: 1.  Anemia: Acute on chronic and FOBT positive on Xarelto and also daily NSAIDs: Concern for PUD/AVM/intestinal neoplasm, hemoglobin initially down to 7 from 11.6 in July--> 2 unit PRBCs--> 8.4 2.  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  A-fib: On Xarelto at home, currently on hold now last dose 9/23 4.  Chronic GERD with pyrosis: Asymptomatic on daily Prilosec 5.  UTI: On Rocephin 6.  Some constipation since starting Xarelto  Plan: 1.  Discussed with patient that she would need to complete another bowel prep in order for [place] to do procedures tomorrow.  She and her husband do not want to proceed if this is the case.  They want her to be able to eat and she wants to go home.  I explained that this would not be the best for her given that she will need to stay on a blood thinner and could continue bleeding and would possibly end up right back here.  They want to speak to my supervisor.  Discussed with Dr. Leonides Schanz who will be by later this afternoon. 2.  Offered the patient Reglan to help with bowel prep.  She still declined. 3.  Further recommendations after speaking with Dr. Leonides Schanz later today.  Thank you for your kind consultation, we will continue to follow along.   LOS: 2 days   Unk Lightning  01/14/2023, 10:45 AM

## 2023-01-14 NOTE — Progress Notes (Signed)
Daily Progress Note Intern Pager: [phone]  Patient name: Megan Espinoza Medical record number: [id number] Date of birth: 1955/11/12 Age: 67 y.o. Gender: female  Primary Care Provider: Ellyn Hack, MD Consultants: Neurology, GI  Code Status: Full Code  Pt Overview and Major Events to Date:  Megan Espinoza is a 67 y.o. female with past medical history of Afib on Xarelto and HTN presenting with symptomatic anemia secondary to possible GIB and aphasia found to have a small ischemic stroke.   Assessment and Plan: Megan Espinoza is a 67 y.o. female with past medical history of Afib on Xarelto and HTN presenting with symptomatic anemia secondary to possible GIB and aphasia found to have a small ischemic stroke.  Assessment & Plan Symptomatic anemia Likely in the setting of GIB due to consistent use of Aleve (for bilateral knee pain) and Xarelto at home, positive FOBT in the ED. No signs of active bleeding noted per patient. Notable iron deficiency anemia Fe 9, Sat ratio 2, Ferritin 9. Hgb upon admission 7.0, improved to 8.4 s/p 2u PRBC, remains stable at 8.0 today. - Monitor with PM CBC - 2 large-bore IVs - Protonix 40 IM BID - GI on board, plan for EGD/Colonoscopy - patient failed bowel prep today, so will plan to attempt another round of bowel prep today & procedures tomorrow - Will discuss restarting Xarelto with GI s/p procedures - NPO at MN pending GI conversation with patient - Consider IV infusions inpatient vs outpatient Acute ischemic stroke [facility]) CT head w/o contrast demonstrated age indeterminate hypodensity/lacunar infarct within the left white matter. MR brain w/o contrast 1.1cm acute ischemic nonhemorrhagic infarct involving the R frontal corona radiata. Lipid panel demonstrated LDL 78, HDL 35, and TG 106, will start statin therapy. A1c well controlled at 5.1. Passed swallow study, will change diet depending on GI. - Atorvastatin 40 mg - Continue ASA 81  mg - Will restart Xarelto depending on GI recs - Permissive HTN per neuro - Neuro checks q4 - Cardiac monitoring - PT/OT - Neuro on board, appreciate recs Asymptomatic bacteriuria (Resolved: 01/14/2023) UA demonstrated many bacteria, large leukocytes, positive nitrites, small hematuria, proteinuria 30.  No complaints of dysuria or increased urinary frequency. - s/p 1 dose Rocephin in the ED, no need for more abx at this time - discontinued urine culture  Chronic and Stable Problems:  Atrial Fibrillation: On Xarelto 20 mg daily, held for possible GIB HTN: Held home amlodipine 10 mg tablet, hydrochlorothiazide 12.5 mg, and metoprolol XR 25 mg for permissive HTN   FEN/GI: CLD PPx: SCDs Dispo: pending EGD  Subjective:  Patient is doing fine this morning and has no major concerns. Is able to get up and go to the bathroom without discomfort. Is agitated that she has to get this EGD/Colonoscopy and really would like to go home. Pending EGD/Colonoscopy today. Denies chest discomfort or shortness of breath. No active bleeding. Expresses that she couldn't tolerate her bowel prep well but has been on a CLD for the past couple days.  Objective: Temp:  [97.8 F (36.6 C)-98.3 F (36.8 C)] 98 F (36.7 C) (09/25 2348) Pulse Rate:  [88-102] 100 (09/25 2348) Resp:  [16-23] 18 (09/25 2348) BP: (128-145)/(48-75) 145/75 (09/25 2348) SpO2:  [90 %-100 %] 90 % (09/25 2348) Physical Exam: General: Awake and Alert in NAD HEENT: Normocephalic, atraumatic. Conjunctiva normal. No nasal discharge Cardiovascular: Tachycardic. No M/R/G Respiratory: CTAB, normal WOB on RA. No wheezing, crackles, rhonchi, or diminished breath  sounds. Abdomen: Soft, non-tender, non-distended. Bowel sounds normoactive Extremities: No BLE edema, no deformities or significant joint findings. Skin: Warm and dry. Neuro: A&Ox3. No focal neurological deficits.  Laboratory: Most recent CBC Lab Results  Component Value Date   WBC  5.7 01/14/2023   HGB 8.0 (L) 01/14/2023   HCT 27.1 (L) 01/14/2023   MCV 83.4 01/14/2023   PLT 261 01/14/2023   Most recent BMP    Latest Ref Rng & Units 01/14/2023    3:39 AM  BMP  Glucose 70 - 99 mg/dL 130   BUN 8 - 23 mg/dL 15   Creatinine 8.65 - 1.00 mg/dL 7.84   Sodium 696 - 295 mmol/L 139   Potassium 3.5 - 5.1 mmol/L 3.5   Chloride 98 - 111 mmol/L 106   CO2 22 - 32 mmol/L 24   Calcium 8.9 - 10.3 mg/dL 8.6    Imaging/Diagnostic Tests: No new imaging.  Fortunato Curling, DO 01/14/2023, 7:25 AM PGY-1, [facility]  FPTS Intern pager: [phone], text pages welcome Secure chat group [facility] Teaching Service

## 2023-01-14 NOTE — Assessment & Plan Note (Deleted)
Pt w/ symptomatic anemia, Hgb 7, w/ positive FOBT. Patient reports no history of dark, tarry stools, no bright red blood in stools. No history of bleeding she is aware of. Chart review does not provide a history of endoscopy or colonoscopy. The ED reached out to GI, plans to be seen in AM. -Transfuse 1 unit PRBC -2 Large bore IV -NPO at midnight -GI following, appreciate recs - IV Protonix

## 2023-01-14 NOTE — Progress Notes (Signed)
Pt drank all of first bottle of bowel prep and 1/4 of second bottle of bowel prep. Stool is liquid light brown. She has been NPO since MN. Consent for EDG and colonoscopy has been signed.

## 2023-01-15 ENCOUNTER — Other Ambulatory Visit (HOSPITAL_COMMUNITY): Payer: Self-pay

## 2023-01-15 ENCOUNTER — Encounter (HOSPITAL_COMMUNITY)
Admission: EM | Disposition: A | Discharge status: Home / Self Care | Payer: Self-pay | Source: Home / Self Care | Attending: Family Medicine

## 2023-01-15 ENCOUNTER — Inpatient Hospital Stay (HOSPITAL_COMMUNITY): Payer: Medicare HMO | Admitting: Anesthesiology

## 2023-01-15 ENCOUNTER — Encounter (HOSPITAL_COMMUNITY): Payer: Self-pay

## 2023-01-15 DIAGNOSIS — K259 Gastric ulcer, unspecified as acute or chronic, without hemorrhage or perforation: Secondary | ICD-10-CM

## 2023-01-15 DIAGNOSIS — D126 Benign neoplasm of colon, unspecified: Secondary | ICD-10-CM

## 2023-01-15 DIAGNOSIS — D125 Benign neoplasm of sigmoid colon: Secondary | ICD-10-CM

## 2023-01-15 DIAGNOSIS — K2289 Other specified disease of esophagus: Secondary | ICD-10-CM

## 2023-01-15 DIAGNOSIS — D649 Anemia, unspecified: Secondary | ICD-10-CM | POA: Diagnosis not present

## 2023-01-15 DIAGNOSIS — K648 Other hemorrhoids: Secondary | ICD-10-CM

## 2023-01-15 DIAGNOSIS — D509 Iron deficiency anemia, unspecified: Secondary | ICD-10-CM | POA: Diagnosis not present

## 2023-01-15 DIAGNOSIS — K449 Diaphragmatic hernia without obstruction or gangrene: Secondary | ICD-10-CM

## 2023-01-15 HISTORY — PX: POLYPECTOMY: SHX5525

## 2023-01-15 HISTORY — PX: COLONOSCOPY WITH PROPOFOL: SHX5780

## 2023-01-15 HISTORY — PX: ESOPHAGOGASTRODUODENOSCOPY (EGD) WITH PROPOFOL: SHX5813

## 2023-01-15 HISTORY — PX: BIOPSY: SHX5522

## 2023-01-15 LAB — BASIC METABOLIC PANEL
Anion gap: 9 (ref 5–15)
BUN: 13 mg/dL (ref 8–23)
CO2: 23 mmol/L (ref 22–32)
Calcium: 8.3 mg/dL — ABNORMAL LOW (ref 8.9–10.3)
Chloride: 108 mmol/L (ref 98–111)
Creatinine, Ser: 1.08 mg/dL — ABNORMAL HIGH (ref 0.44–1.00)
GFR, Estimated: 56 mL/min — ABNORMAL LOW (ref >60)
Glucose, Bld: 97 mg/dL (ref 70–99)
Potassium: 3.6 mmol/L (ref 3.5–5.1)
Sodium: 140 mmol/L (ref 135–145)

## 2023-01-15 LAB — CBC
HCT: 27.4 % — ABNORMAL LOW (ref 36.0–46.0)
Hemoglobin: 7.9 g/dL — ABNORMAL LOW (ref 12.0–15.0)
MCH: 24 pg — ABNORMAL LOW (ref 26.0–34.0)
MCHC: 28.8 g/dL — ABNORMAL LOW (ref 30.0–36.0)
MCV: 83.3 fL (ref 80.0–100.0)
Platelets: 267 10*3/uL (ref 150–400)
RBC: 3.29 MIL/uL — ABNORMAL LOW (ref 3.87–5.11)
RDW: 16.4 % — ABNORMAL HIGH (ref 11.5–15.5)
WBC: 6.7 10*3/uL (ref 4.0–10.5)
nRBC: 0 % (ref 0.0–0.2)

## 2023-01-15 LAB — URINE CULTURE

## 2023-01-15 SURGERY — COLONOSCOPY WITH PROPOFOL
Anesthesia: Monitor Anesthesia Care

## 2023-01-15 MED ORDER — MIDAZOLAM HCL 5 MG/5ML IJ SOLN
INTRAMUSCULAR | Status: DC | PRN
Start: 2023-01-15 — End: 2023-01-15
  Administered 2023-01-15 (×2): 1 mg via INTRAVENOUS

## 2023-01-15 MED ORDER — PROPOFOL 10 MG/ML IV BOLUS
INTRAVENOUS | Status: DC | PRN
Start: 2023-01-15 — End: 2023-01-15
  Administered 2023-01-15: 50 mg via INTRAVENOUS
  Administered 2023-01-15: 20 mg via INTRAVENOUS
  Administered 2023-01-15 (×2): 50 mg via INTRAVENOUS
  Administered 2023-01-15: 30 mg via INTRAVENOUS

## 2023-01-15 MED ORDER — APIXABAN 5 MG PO TABS
5.0000 mg | ORAL_TABLET | Freq: Two times a day (BID) | ORAL | 0 refills | Status: DC
Start: 2023-01-16 — End: 2023-02-17
  Filled 2023-01-15: qty 60, 30d supply, fill #0

## 2023-01-15 MED ORDER — PANTOPRAZOLE SODIUM 40 MG PO TBEC
DELAYED_RELEASE_TABLET | ORAL | 0 refills | Status: DC
Start: 2023-03-26 — End: 2023-01-15
  Filled 2023-01-15: qty 160, 90d supply, fill #0

## 2023-01-15 MED ORDER — PROPOFOL 500 MG/50ML IV EMUL
INTRAVENOUS | Status: DC | PRN
  Administered 2023-01-15: 50 ug/kg/min via INTRAVENOUS

## 2023-01-15 MED ORDER — ATORVASTATIN CALCIUM 40 MG PO TABS
40.0000 mg | ORAL_TABLET | Freq: Every day | ORAL | 0 refills | Status: DC
  Filled 2023-01-15: qty 30, 30d supply, fill #0

## 2023-01-15 MED ORDER — LIDOCAINE 2% (20 MG/ML) 5 ML SYRINGE
INTRAMUSCULAR | Status: DC | PRN
  Administered 2023-01-15: 100 mg via INTRAVENOUS

## 2023-01-15 MED ORDER — LACTATED RINGERS IV SOLN: INTRAVENOUS | Status: DC

## 2023-01-15 MED ORDER — PANTOPRAZOLE SODIUM 40 MG PO TBEC: 40.0000 mg | DELAYED_RELEASE_TABLET | Freq: Two times a day (BID) | ORAL | Status: DC

## 2023-01-15 MED ORDER — MIDAZOLAM HCL 2 MG/2ML IJ SOLN
INTRAMUSCULAR | Status: AC
  Filled 2023-01-15: qty 2

## 2023-01-15 MED ORDER — PANTOPRAZOLE SODIUM 40 MG PO TBEC: DELAYED_RELEASE_TABLET | ORAL | 0 refills | Status: DC

## 2023-01-15 MED ORDER — ACETAMINOPHEN 325 MG PO TABS
650.0000 mg | ORAL_TABLET | Freq: Four times a day (QID) | ORAL | Status: DC | PRN
  Administered 2023-01-15: 650 mg via ORAL
  Filled 2023-01-15: qty 2

## 2023-01-15 SURGICAL SUPPLY — 25 items

## 2023-01-15 NOTE — Assessment & Plan Note (Signed)
Likely in the setting of GIB. Notable iron deficiency anemia Fe 9, Sat ratio 2, Ferritin 9. Hgb upon admission 7.0, improved to 8.4 s/p 2u PRBC. EGD/Colonoscopy today. EGD revealed 3 nonbleeding ulcers were found and were biopsied. Thought GI bleed most likely 2/2 to pyloric ulcers and/ or Sheria Lang' s erosions. Colonoscopy revealed diverticulosis in the sigmoid and descending colon, one 4 mm polyp in the sigmoid colon, and non-bleeding internal hemorrhoids.  - Monitor with PM CBC - 2 large-bore IVs in place - Protonix 40 mg BID x 10 weeks and then daily - Switching Xarelto to Eliquis starting 9/28

## 2023-01-15 NOTE — Anesthesia Postprocedure Evaluation (Signed)
Anesthesia Post Note  Patient: Megan Espinoza  Procedure(s) Performed: COLONOSCOPY WITH PROPOFOL ESOPHAGOGASTRODUODENOSCOPY (EGD) WITH PROPOFOL BIOPSY POLYPECTOMY     Patient location during evaluation: PACU Anesthesia Type: MAC Level of consciousness: awake and alert Pain management: pain level controlled Vital Signs Assessment: post-procedure vital signs reviewed and stable Respiratory status: spontaneous breathing, nonlabored ventilation, respiratory function stable and patient connected to nasal cannula oxygen Cardiovascular status: stable and blood pressure returned to baseline Postop Assessment: no apparent nausea or vomiting Anesthetic complications: no  No notable events documented.  Last Vitals:  Vitals:   01/15/23 1045 01/15/23 1238  BP: 127/74 (!) 138/59  Pulse: 83 90  Resp: 19   Temp: (!) 36.4 C (!) 36.3 C  SpO2: 93% 94%    Last Pain:  Vitals:   01/15/23 1238  TempSrc: Oral  PainSc:                  Kennieth Rad

## 2023-01-15 NOTE — Op Note (Addendum)
[facility] - Closed Patient Name: Megan Espinoza Procedure Date : 01/15/2023 MRN: [id number] Attending MD: Particia Lather , , [id number] Date of Birth: 19-Jan-1956 CSN: [id number] Age: 67 Admit Type: Inpatient Procedure:                Upper GI endoscopy Indications:              Iron deficiency anemia Providers:                Madelyn Brunner" Jesus Genera, RN, Suzy Bouchard, RN, Kandice Robinsons, Technician Referring MD:             Hospitalist team Medicines:                Monitored Anesthesia Care Complications:            No immediate complications. Estimated Blood Loss:     Estimated blood loss was minimal. Procedure:                Pre-Anesthesia Assessment:                           - Prior to the procedure, a History and Physical                            was performed, and patient medications and                            allergies were reviewed. The patient's tolerance of                            previous anesthesia was also reviewed. The risks                            and benefits of the procedure and the sedation                            options and risks were discussed with the patient.                            All questions were answered, and informed consent                            was obtained. Prior Anticoagulants: The patient has                            taken Xarelto (rivaroxaban), last dose was 4 days                            prior to procedure. ASA Grade Assessment: III - A                            patient with severe systemic disease. After  reviewing the risks and benefits, the patient was                            deemed in satisfactory condition to undergo the                            procedure.                           After obtaining informed consent, the endoscope was                            passed under direct vision. Throughout the                             procedure, the patient's blood pressure, pulse, and                            oxygen saturations were monitored continuously. The                            [id number]-[id number] ([id number]) Olympus endoscope was introduced                            through the mouth, and advanced to the second part                            of duodenum. The upper GI endoscopy was                            accomplished without difficulty. The patient                            tolerated the procedure well. Scope In: Scope Out: Findings:      Salmon-colored mucosa was present. The maximum longitudinal extent of       these esophageal mucosal changes was 2 cm in length. Mucosa was biopsied       with a cold forceps for histology. One specimen bottle was sent to       pathology.      A large hiatal hernia was present.      Multiple localized erosions with no bleeding and no stigmata of recent       bleeding were found in the cardia.      Three non-bleeding linear gastric ulcers with a clean ulcer base       (Forrest Class III) were found at the pylorus. The largest lesion was 8       mm in largest dimension. Biopsies were taken near the ulcers and from       the gastric antrum, pylorus, fundus, and cardia with a cold forceps for       Helicobacter pylori testing.      The examined duodenum was normal. Biopsies were taken with a cold       forceps for histology. Impression:               - Salmon-colored mucosa. Biopsied.                           -  Large hiatal hernia.                           - Erosive gastropathy with no bleeding and no                            stigmata of recent bleeding.                           - Non-bleeding gastric ulcers with a clean ulcer                            base (Forrest Class III). Biopsied.                           - Normal examined duodenum. Biopsied. Recommendation:           - Await pathology results.                           - Use a proton pump inhibitor PO BID for 10  weeks,                            then QD.                           - Please avoid NSAID medications.                           - Consider repeat EGD in 2-3 months for assess for                            healing of gastric ulcers.                           - Perform a colonoscopy today. Procedure Code(s):        --- Professional ---                           [phone], Esophagogastroduodenoscopy, flexible,                            transoral; with biopsy, single or multiple Diagnosis Code(s):        --- Professional ---                           K22.89, Other specified disease of esophagus                           K44.9, Diaphragmatic hernia without obstruction or                            gangrene                           K31.89, Other diseases of stomach and duodenum  K25.9, Gastric ulcer, unspecified as acute or                            chronic, without hemorrhage or perforation                           D50.9, Iron deficiency anemia, unspecified CPT copyright 2022 American Medical Association. All rights reserved. The codes documented in this report are preliminary and upon coder review may  be revised to meet current compliance requirements. Dr Particia Lather "Alan Ripper" Leonides Schanz,  01/15/2023 9:23:40 AM Number of Addenda: 0

## 2023-01-15 NOTE — Assessment & Plan Note (Addendum)
Likely in the setting of GIB. Notable iron deficiency anemia Fe 9, Sat ratio 2, Ferritin 9. Hgb upon admission 7.0, improved to 8.4 s/p 2u PRBC. EGD/Colonoscopy today. EGD revealed 3 nonbleeding ulcers were found and were biopsied. Thought GI bleed most likely 2/2 to pyloric ulcers and/ or Sheria Lang' s erosions. PO Protonix BID x 10 weeks (12/5) and then daily. Colonoscopy revealed diverticulosis in the sigmoid and descending colon, one 4 mm polyp in the sigmoid colon, and non-bleeding internal hemorrhoids.  - Monitor with PM CBC - 2 large-bore IVs - Protonix 40 mg BID - Switching Xarelto to Eliquis - Consider IV iron infusions inpatient vs outpatient - Transfusion threshold <8

## 2023-01-15 NOTE — Progress Notes (Signed)
D/c tele and Ivs. Went over AVS with pt and her husband and all questions were addressed.   Lawson Radar, RN

## 2023-01-15 NOTE — Transfer of Care (Signed)
Immediate Anesthesia Transfer of Care Note  Patient: Megan Espinoza  Procedure(s) Performed: COLONOSCOPY WITH PROPOFOL ESOPHAGOGASTRODUODENOSCOPY (EGD) WITH PROPOFOL BIOPSY POLYPECTOMY  Patient Location: PACU  Anesthesia Type:MAC  Level of Consciousness: awake, oriented, and drowsy  Airway & Oxygen Therapy: Patient Spontanous Breathing and Patient connected to face mask oxygen  Post-op Assessment: Report given to RN, Post -op Vital signs reviewed and stable, and Patient moving all extremities  Post vital signs: Reviewed and stable  Last Vitals:  Vitals Value Taken Time  BP 110/56 01/15/23 0925  Temp 36.3 C 01/15/23 0924  Pulse 90 01/15/23 0929  Resp 18 01/15/23 0929  SpO2 100 % 01/15/23 0929  Vitals shown include unfiled device data.  Last Pain:  Vitals:   01/15/23 0924  TempSrc: Temporal  PainSc: 0-No pain      Patients Stated Pain Goal: 0 (01/12/23 2007)  Complications: No notable events documented.

## 2023-01-15 NOTE — Assessment & Plan Note (Deleted)
Pt w/ symptomatic anemia, Hgb 7, w/ positive FOBT. Patient reports no history of dark, tarry stools, no bright red blood in stools. No history of bleeding she is aware of. EGD/Colonoscopy revealed 3 nonbleeding ulcers were found and were biopsied. Thought GI bleed most likely 2/2 to pyloric ulcers and/ or Sheria Lang' s erosions. PO BID recommended for 10 weeks. Xarelto was restarted on   The examined portion of the ileum was normal. - Diverticulosis in the sigmoid colon and in the descending colon. - One 4 mm polyp in the sigmoid colon, removed with a cold snare. Resected and retrieved. - Non- bleeding internal hemorrhoids.  -Transfuse 1 unit PRBC -2 Large bore IV -NPO at midnight -GI following, appreciate recs - IV Protonix

## 2023-01-15 NOTE — Op Note (Signed)
[facility] Patient Name: Megan Espinoza Procedure Date : 01/15/2023 MRN: [id number] Attending MD: Particia Lather , , [id number] Date of Birth: [birth year] CSN: [id number] Age: 67 Admit Type: Inpatient Procedure:                Colonoscopy Indications:              Iron deficiency anemia Providers:                Madelyn Brunner" Jesus Genera, RN, Suzy Bouchard, RN, Kandice Robinsons, Technician Referring MD:             Hospitalist team Medicines:                Monitored Anesthesia Care Complications:            No immediate complications. Estimated Blood Loss:     Estimated blood loss was minimal. Procedure:                Pre-Anesthesia Assessment:                           - Prior to the procedure, a History and Physical                            was performed, and patient medications and                            allergies were reviewed. The patient's tolerance of                            previous anesthesia was also reviewed. The risks                            and benefits of the procedure and the sedation                            options and risks were discussed with the patient.                            All questions were answered, and informed consent                            was obtained. Prior Anticoagulants: The patient has                            taken Xarelto (rivaroxaban), last dose was 4 days                            prior to procedure. ASA Grade Assessment: III - A                            patient with severe systemic disease. After  reviewing the risks and benefits, the patient was                            deemed in satisfactory condition to undergo the                            procedure.                           After obtaining informed consent, the colonoscope                            was passed under direct vision. Throughout the                             procedure, the patient's blood pressure, pulse, and                            oxygen saturations were monitored continuously. The                            [id number]-HQ190L ([id number]) Olympus coloscope was                            introduced through the anus and advanced to the the                            terminal ileum. The colonoscopy was performed                            without difficulty. The patient tolerated the                            procedure well. The quality of the bowel                            preparation was adequate. The terminal ileum,                            ileocecal valve, appendiceal orifice, and rectum                            were photographed. Scope In: 8:59:33 AM Scope Out: 9:16:47 AM Scope Withdrawal Time: 0 hours 13 minutes 55 seconds  Total Procedure Duration: 0 hours 17 minutes 14 seconds  Findings:      The terminal ileum appeared normal.      Multiple diverticula were found in the sigmoid colon and descending       colon.      A 4 mm polyp was found in the sigmoid colon. The polyp was sessile. The       polyp was removed with a cold snare. Resection and retrieval were       complete.      Non-bleeding internal hemorrhoids were found during retroflexion. Impression:               -  The examined portion of the ileum was normal.                           - Diverticulosis in the sigmoid colon and in the                            descending colon.                           - One 4 mm polyp in the sigmoid colon, removed with                            a cold snare. Resected and retrieved.                           - Non-bleeding internal hemorrhoids. Recommendation:           - Discharge patient to home (with escort).                           - Patient's iron deficiency anemia is likely due to                            pyloric ulcers and/or Cameron's erosions.                           - Await pathology results.                           - Okay  to restart Xarelto tomorrow.                           - We will arrange for GI clinic follow up.                           - The findings and recommendations were discussed                            with the patient. Procedure Code(s):        --- Professional ---                           [phone], Colonoscopy, flexible; with removal of                            tumor(s), polyp(s), or other lesion(s) by snare                            technique Diagnosis Code(s):        --- Professional ---                           K64.8, Other hemorrhoids                           D12.5, Benign neoplasm of sigmoid colon  D50.9, Iron deficiency anemia, unspecified                           K57.30, Diverticulosis of large intestine without                            perforation or abscess without bleeding CPT copyright 2022 American Medical Association. All rights reserved. The codes documented in this report are preliminary and upon coder review may  be revised to meet current compliance requirements. Dr Particia Lather "Alan Ripper" Leonides Schanz,  01/15/2023 9:27:33 AM Number of Addenda: 0

## 2023-01-15 NOTE — Progress Notes (Addendum)
STROKE TEAM PROGRESS NOTE   BRIEF HPI [name] is a 67 y.o. female with history of obesity with a BMI of 47.61, COPD, acute on chronic anemia, AF on Xarelto, and essential hypertension presenting with a brief episode of slurred speech the morning of 9/24, trouble typing at work with frequent misspellings (felt to be at least mechanical due to a new, unfamiliar keyboard as patient recognized misspellings and took the time to fix them, patient was not using the wrong words), and a fall to sitting on the floor as she missed her chair at a salon without striking her head but due to oral anticoagulation, she was directed to be assessed in the ED.   SIGNIFICANT HOSPITAL EVENTS 9/24: ED work up significant for anemia with a Hgb of 7.0, guaiac +, findings consistent with a UTI, and an MRI brain with a small right frontal corona radiata stroke. Patient was started on Rocephin, GI and neurology consulted, 1 unit PRBC administered, and she was admitted for further evaluation. 9/27 EGD and colonoscopy performed demonstrating 3 nonbleeding gastric ulcers  INTERIM HISTORY/SUBJECTIVE Patient is seen in her room with her husband at the bedside.  She is just undergone her EGD and colonoscopy, and discussions are underway about when to resume Xarelto or Eliquis.  Her hemoglobin is stable at 8.3, and she remains hemodynamically stable. Colonoscopy today by Dr. Leonides Schanz showed diverticulosis in the sigmoid and descending colon with a 4 mm polyp and pyloric ulcers.  Dr. Leonides Schanz recommends resuming Eliquis tomorrow. OBJECTIVE  CBC    Component Value Date/Time   WBC 6.4 01/14/2023 1920   RBC 3.46 (L) 01/14/2023 1920   HGB 8.3 (L) 01/14/2023 1920   HCT 28.5 (L) 01/14/2023 1920   PLT 282 01/14/2023 1920   MCV 82.4 01/14/2023 1920   MCH 24.0 (L) 01/14/2023 1920   MCHC 29.1 (L) 01/14/2023 1920   RDW 16.3 (H) 01/14/2023 1920   LYMPHSABS 1.6 01/12/2023 1530   MONOABS 0.7 01/12/2023 1530   EOSABS 0.4  01/12/2023 1530   BASOSABS 0.0 01/12/2023 1530   BMET    Component Value Date/Time   NA 139 01/14/2023 0339   K 3.5 01/14/2023 0339   CL 106 01/14/2023 0339   CO2 24 01/14/2023 0339   GLUCOSE 120 (H) 01/14/2023 0339   BUN 15 01/14/2023 0339   CREATININE 1.06 (H) 01/14/2023 0339   CALCIUM 8.6 (L) 01/14/2023 0339   GFRNONAA 58 (L) 01/14/2023 0339   IMAGING past 24 hours No results found.  Vitals:   01/15/23 0940 01/15/23 0949 01/15/23 1045 01/15/23 1238  BP: (!) 132/55 (!) 146/63 127/74 (!) 138/59  Pulse: 84 84 83 90  Resp: 18 15 19    Temp:   (!) 97.5 F (36.4 C) (!) 97.4 F (36.3 C)  TempSrc:   Oral Oral  SpO2: 98% 97% 93% 94%  Weight:      Height:       PHYSICAL EXAM General:  Alert,  obese Caucasian female in no acute distress Psych: Calm and cooperative, affect appropriate for situation CV: Irregular rate and rhythm on cardiac monitor Respiratory:  Regular, unlabored respirations on room air GI: Abdomen soft and nontender  NEURO:  Mental Status: AA&Ox3, patient is able to give clear and coherent history and can discuss her current situation Speech/Language: speech is without dysarthria or aphasia.    Cranial Nerves:  II: PERRL.  III, IV, VI: EOMI without gaze preference, nystagmus, or ptosis. VII: Face is symmetrical resting and with  movement VIII: Hearing intact to voice. IX, X: Phonation is normal.  XII: tongue is midline without fasciculations. Motor: Moves all 4 extremities with good antigravity strength Tone: is normal and bulk is normal Sensation: Intact to light touch bilaterally.    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  Atrophy and mild chronic small vessel  ischemic changes of the white matter. CTA head & neck 9/25: No emergent finding.  No flow-limiting stenosis or vessel irregularity underlying the patient's white matter infarct. MRI 9/25: 1.1 cm acute ischemic nonhemorrhagic infarct involving the right frontal corona radiata.  Underlying age-related cerebral atrophy with fairly advanced chronic microvascular ischemic disease.  Multiple scattered chronic microhemorrhages.   2D Echo EF 60 to 65%, left atrium moderately dilated, degenerative mitral valve with mild mitral valve regurgitation, moderate aortic valve stenosis, no evidence of interatrial shunt Consider watchman placement outpatient Will need sleep study outpatient LDL 78 HgbA1c 5.1% VTE prophylaxis -SCDs in the setting of possible GUB with + FOBT Xarelto (rivaroxaban) daily prior to admission, now on No antithrombotic. Consider transition to Eliquis for Xarelto failure when cleared by GI.  Patient states that GI team is likely to clear her to resume Eliquis soon Alternative anticoagulation therapies discussed briefly, will need further discussion of alternatives when patient is cleared. Therapy recommendations: None, no deficits on exam Disposition:  pending  Atrial fibrillation Home Meds: Xarelto, last dose 9/23 due to symptomatic anemia and + FOBT in ED  Continue telemetry monitoring Resume anticoagulation when cleared by GI  Hypertension Home meds: Norvasc, hydrochlorothiazide, metoprolol Stable  Gradual normotension  Hyperlipidemia Home meds:  none LDL 78, goal < 70 Add atorvastatin 40 mg PO daily  Continue statin at discharge  Other Stroke Risk Factors Obesity, Body mass index is 43.85 kg/m., BMI >/= 30 associated with increased stroke risk, recommend weight loss, diet and exercise as appropriate   Other Active Problems Acute UTI On Rocephin FOBT+  GI consulted, ED and colonoscopy performed 9/27 reveal nonbleeding gastric ulcers Anemia (acute on  chronic) FOBT+ S/p 1 unit PRBC Hgb 7.0 > 7.5 -> 8.0-> 8.3 GI on board as above Low serum iron, ferritin Serum B12 pending with history of pernicious anemia   Hospital day # 3  Patient seen by NP and then by MD, MD to edit note as needed. Cortney E Ernestina Columbia , MSN, AGACNP-BC [facility] See Amion for schedule and pager information 01/15/2023 2:09 PM I have personally obtained history,examined this patient, reviewed notes, independently viewed imaging studies, participated in medical decision making and plan of care.ROS completed by me personally and pertinent positives fully documented  I have made any additions or clarifications directly to the above note. Agree with note above.  Patient presented with small subcortical infarct likely from small vessel disease but has atrial fibrillation and needs long-term anticoagulation on Xarelto.  Colonoscopy showed small polyp and some pyloric ulcers and GI feels patient is safe for anticoagulation starting tomorrow.  Resume Xarelto tomorrow.  Follow-up as an outpatient stroke clinic in 2 months.  Greater than 50% time during this 35-minute visit was spent on counseling and coordination of care and discussion with patient and her husband and care team and answering questions.  Stroke team will sign off.  Delia Heady, MD Medical Director  [facility] Pager: [phone] 01/15/2023 3:08 PM  To contact Stroke Continuity provider, please refer to [email or web address]. After hours, contact General Neurology

## 2023-01-15 NOTE — Discharge Instructions (Addendum)
Thank you for allowing [place] to participate in your care!   Megan Espinoza, you stayed in the hospital because of severe anemia requiring blood transfusions and a small stroke. GI and neurology were consulted during your hospitalization. GI performed an EGD and colonoscopy which demonstrated some ulcers that could be the culprit of the bleed. We made some medication changes during your stay and reviewed them with you prior to discharge.  We discussed the risks of leaving the hospital and the risk of bleeding with starting your Eliquis (in place of your Xarelto) tomorrow at home. We also discussed that you should follow up with your PCP early next week or get labs early next week to evaluate your hemoglobin level.  Discharge Date: 01/15/23  Instructions for Home: 1) Please be aware of any signs of bleeding. If you notice any signs of bleeding or severe fatigue, please return to the hospital or contact your PCP quickly for recommendations. 2) Follow up with your PCP early next week for hospital follow up and lab work to evaluate your hemoglobin.  3) Repeat EGD per GI in 2-3 months to assess the healing of your stomach ulcers  New medication during this admission:  - Only restart your Amlodipine of the three blood pressure medications. - Start Eliquis 5 mg twice daily instead of Xarelto - Take Pantoprazole (Protonix) 40 mg twice daily for 10 weeks (till 12/5) and then switch to once daily - Atorvastatin 40 mg was started for risk reduction and to help lower your lipids under goal (<70)  Please be aware that pharmacies may use different concentrations of medications.  Be sure to check with your pharmacist and the label on your prescription bottle for the appropriate amount of medication to give to your child.  Feeding: regular home feeding (diet with lots of water, fruits and vegetables and low in junk food)  Activity Restrictions: none, avoid falling  Person receiving printed copy of discharge  instructions: Megan Espinoza  Information on my medicine - ELIQUIS (apixaban)  This medication education was reviewed with me or my healthcare representative as part of my discharge preparation.  Why was Eliquis prescribed for you? Eliquis was prescribed for you to reduce the risk of a blood clot forming that can cause a stroke if you have a medical condition called atrial fibrillation (a type of irregular heartbeat).  What do You need to know about Eliquis ? Take your Eliquis TWICE DAILY - one tablet in the morning and one tablet in the evening with or without food. If you have difficulty swallowing the tablet whole please discuss with your pharmacist how to take the medication safely.  Take Eliquis exactly as prescribed by your doctor and DO NOT stop taking Eliquis without talking to the doctor who prescribed the medication.  Stopping may increase your risk of developing a stroke.  Refill your prescription before you run out.  After discharge, you should have regular check-up appointments with your healthcare provider that is prescribing your Eliquis.  In the future your dose may need to be changed if your kidney function or weight changes by a significant amount or as you get older.  What do you do if you miss a dose? If you miss a dose, take it as soon as you remember on the same day and resume taking twice daily.  Do not take more than one dose of ELIQUIS at the same time to make up a missed dose.  Important Safety Information A possible side effect of  Eliquis is bleeding. You should call your healthcare provider right away if you experience any of the following: Bleeding from an injury or your nose that does not stop. Unusual colored urine (red or dark brown) or unusual colored stools (red or black). Unusual bruising for unknown reasons. A serious fall or if you hit your head (even if there is no bleeding).  Some medicines may interact with Eliquis and might increase your  risk of bleeding or clotting while on Eliquis. To help avoid this, consult your healthcare provider or pharmacist prior to using any new prescription or non-prescription medications, including herbals, vitamins, non-steroidal anti-inflammatory drugs (NSAIDs) and supplements.  This website has more information on Eliquis (apixaban): http://www.eliquis.com/eliquis/home

## 2023-01-15 NOTE — Progress Notes (Signed)
OT Cancellation Note  Patient Details Name: Megan Espinoza MRN: [id number] DOB: 12/09/1955   Cancelled Treatment:    Reason Eval/Treat Not Completed: Patient at procedure or test/ unavailable (Pt currently off unit for procedure. OT to reattempt to see pt for skilled OT eval later this day as appropriate/available.)  Rosanne Sack "Orson Eva., OTR/L, MA Acute Rehab [phone]   Lendon Colonel 01/15/2023, 8:37 AM

## 2023-01-15 NOTE — Discharge Summary (Cosign Needed Addendum)
Family Medicine Teaching [facility] Discharge Summary  Patient name: Megan Espinoza Medical record number: [id number] Date of birth: 10/29/1955 Age: 67 y.o. Gender: female Date of Admission: 01/12/2023  Date of Discharge: 01/15/23  Admitting Physician: Margaretmary Dys, MD  Primary Care Provider: Ellyn Hack, MD Consultants: Neurology, GI  Indication for Hospitalization: Symptomatic Anemia  Brief Hospital Course:  Megan Espinoza is a 67 y.o.female with a past medical history of A Fib on Xarelto and HTN who was admitted to the [facility] Medicine Teaching Service at [facility] for symptomatic anemia likely secondary to a possible GIB and aphasia found to have a small ischemic stroke. Her hospital course is detailed below:  Symptomatic Anemia Patient presented to the ED feeling tired and weak. Found to have Hgb of 7 and FOBT positive. Received two units of PRBC in the ED and was started on IV Protonix due to suspected GIB. Hgb stabilized to 8.0. GI was consulted who recommended EGD and Colonoscopy which was performed 9/27 which showed 3 nonbleeding ulcers which were biopsied. Thought GIB most likely 2/2 to pyloric ulcers and/ or Sheria Lang' s erosions. Colonoscopy demonstrated diverticulosis in the sigmoid and descending colon, one 4mm polyp in the sigmoid colon which was removed, and non-bleeding internal hemorrhoids. GI recommended PO Protonix BID for 10 weeks (ending 12/5) and then once daily thereafter.   Xarelto was switched to Eliquis per neurology recommendations due to possible Xarelto failure given breakthrough stroke and patient was advised to start medication one day after discharge (9/28). Discussed the risks of bleeding with restarting the Eliquis without close monitoring in the hospital especially in the setting of her GIB, patient understands the risks and would like to discharge prior to starting Eliquis tomorrow and will follow up closely with PCP within one  week for office visit and Monday to check repeat CBC. Upon discharge Hgb remained stable at 7.9.  Acute Ischemic Stroke Patient presented feeling off s/p fall. Stroke workup was done. CT head w/o contrast demonstrated age indeterminate hypodensity/lacunar infarct within the left white matter. MR brain w/o contrast 1.1 cm acute ischemic nonhemorrhagic infarct. Echo demonstrated EF 60-65%, L atrial moderately dilated, mild mitral valve regurgitation, and moderate aortic valve stenosis. Negative bubble study. Continued ASA 81 mg. DAPT deferred given GI bleed. Started Atorvastatin 40 mg daily and switched AC to Eliquis after GI workup to start on 9/28.   Asymptomatic Bacteruria UA demonstrated many bacteria, large leukocytes, positive nitrites, small hematuria, and proteinuria 30 in the ED. Patient had no complaints of dysuria or increased urinary frequency. Urine culture showed Citrobacter koseri and E. coli, she was given one dose of Rocephin in the ED, but we did not continue antibiotics during her admission due to her lack of symptoms.   Other chronic conditions were medically managed with home medications and formulary alternatives as necessary: Atrial Fibrillation: On Xarelto 20 mg daily, held for possible GIB HTN: Held home amlodipine 10 mg tablet, hydrochlorothiazide 12.5 mg, and metoprolol XR 25 mg for permissive HTN, only restarted amlodipine 10 mg upon discharge. COPD: Albuterol q4 PRN  PCP Follow-up Recommendations: Consider sleep study outpatient Consider Watchman placement outpatient Follow up pathology results from EGD. PPI PO BID for 10 weeks (till 12/5) and then once daily. Repeat EGD recommended in 2- 3 months to assess for healing of gastric ulcers.  Repeat CBC on 9/30 Ensure follow up with GI and Neurology  Discharge Diagnoses/Problem List:  Principal Problem:   Symptomatic anemia Active Problems:  Acute ischemic stroke (HCC)   Iron deficiency anemia  Disposition:  Home  Discharge Condition: Stable  Discharge Exam: General: Awake and Alert in NAD HEENT: Normocephalic, atraumatic. Conjunctiva normal. No nasal discharge Cardiovascular: RRR. No M/R/G Respiratory: CTAB, normal WOB on RA. No wheezing, crackles, rhonchi, or diminished breath sounds. Abdomen: Soft, non-tender, non-distended. Bowel sounds normoactive Extremities: No BLE edema, no deformities or significant joint findings. Skin: Warm and dry. Neuro: A&Ox3. No focal neurological deficits.  Significant Procedures: EGD and Colonoscopy 9/27 EGD - Large hiatal hernia.  - Erosive gastropathy with no bleeding and no stigmata of recent bleeding.  - Non- bleeding gastric ulcers with a clean ulcer base ( Forrest Class III) - Biopsied.  - Normal examined duodenum. Biopsied.   Colonoscopy - The examined portion of the ileum was normal.  - Diverticulosis in the sigmoid colon and in the descending colon.  - One 4 mm polyp in the sigmoid colon, removed with a cold snare. Resected and retrieved. - Non- bleeding internal hemorrhoids.  Significant Labs and Imaging:  Recent Labs  Lab 01/14/23 0339 01/14/23 1920 01/15/23 1647  WBC 5.7 6.4 6.7  HGB 8.0* 8.3* 7.9*  HCT 27.1* 28.5* 27.4*  PLT 261 282 267   Recent Labs  Lab 01/14/23 0339  NA 139  K 3.5  CL 106  CO2 24  GLUCOSE 120*  BUN 15  CREATININE 1.06*  CALCIUM 8.6*    Results/Tests Pending at Time of Discharge: none  Discharge Medications:  Allergies as of 01/15/2023       Reactions   Penicillins Swelling   Codeine Nausea And Vomiting   Erythromycin    Mouth thrush- per pt   Sulfa Antibiotics    Mouth thrush- per pt        Medication List     STOP taking these medications    hydrochlorothiazide 25 MG tablet Commonly known as: HYDRODIURIL   metoprolol succinate 25 MG 24 hr tablet Commonly known as: TOPROL-XL   naproxen sodium 220 MG tablet Commonly known as: ALEVE   omeprazole 20 MG capsule Commonly known  as: PRILOSEC   Xarelto 20 MG Tabs tablet Generic drug: rivaroxaban       TAKE these medications    acetaminophen 325 MG tablet Commonly known as: TYLENOL Take 2 tablets (650 mg total) by mouth every 6 (six) hours as needed (or Fever >/= 101).   albuterol 108 (90 Base) MCG/ACT inhaler Commonly known as: VENTOLIN HFA Inhale 2 puffs into the lungs every 4 (four) hours as needed for wheezing or shortness of breath.   amLODipine 10 MG tablet Commonly known as: NORVASC Take 10 mg by mouth daily.   apixaban 5 MG Tabs tablet Commonly known as: ELIQUIS Take 1 tablet (5 mg total) by mouth 2 (two) times daily. Start taking on: January 16, 2023   aspirin EC 81 MG tablet Take 81 mg by mouth daily. Swallow whole.   atorvastatin 40 MG tablet Commonly known as: LIPITOR Take 1 tablet (40 mg total) by mouth daily. Start taking on: January 16, 2023   cyclobenzaprine 10 MG tablet Commonly known as: FLEXERIL Take 10 mg by mouth at bedtime.   pantoprazole 40 MG tablet Commonly known as: PROTONIX Take 1 tablet (40 mg total) by mouth 2 (two) times daily for 10 weeks (12/6), THEN 1 tablet (40 mg) once daily. Start taking on: March 26, 2023        Discharge Instructions: Please refer to Patient Instructions section of EMR for full  details.  Patient was counseled important signs and symptoms that should prompt return to medical care, changes in medications, dietary instructions, activity restrictions, and follow up appointments.   Follow-Up Appointments:  Follow-up Information     Ellyn Hack, MD Follow up on 01/18/2023.   Specialty: Family Medicine Why: Please schedule Contact information: [address] [place] [address] [phone]                 Fortunato Curling, DO 01/15/2023, 5:35 PM PGY-1, [facility]  Upper Level Addendum:  I have seen and evaluated this patient along with Dr. Fatima Blank and reviewed the above note, making necessary  revisions as appropriate.  I agree with the medical decision making and physical exam as noted above.  Levin Erp, MD PGY-3 [facility]

## 2023-01-15 NOTE — TOC Benefit Eligibility Note (Signed)
Patient Product/process development scientist completed.    The patient is insured through [employer]. Patient has Medicare and is not eligible for a copay card, but may be able to apply for patient assistance, if available.    Ran test claim for Eliquis 5 mg and the current 30 day co-pay is $45.00.   This test claim was processed through [facility]- copay amounts may vary at other pharmacies due to pharmacy/plan contracts, or as the patient moves through the different stages of their insurance plan.     Roland Earl, CPHT Pharmacy Technician III Certified Patient Advocate [facility] Patient Advocate Team Direct Number: [phone]  Fax: [phone]

## 2023-01-15 NOTE — Assessment & Plan Note (Signed)
CT head w/o contrast demonstrated age indeterminate hypodensity/lacunar infarct within the left white matter. MR brain w/o contrast 1.1cm acute ischemic nonhemorrhagic infarct involving the R frontal corona radiata. Lipid panel demonstrated LDL 78, HDL 35, and TG 106, will start statin therapy. A1c well controlled at 5.1. Passed swallow study, will change diet depending on GI. - Continue Atorvastatin 40 mg - Continue ASA 81 mg - Start Eliquis tomorrow, education provided by pharmacy and discussed the inc risk of bleeding with Eliquis in setting of GIB, patient understood the risks vs benefits with her A-fib - Permissive HTN per neuro, will restart Amlodipine 10 mg upon discharge - Cardiac monitoring - PT/OT - recommended no follow up

## 2023-01-15 NOTE — Progress Notes (Signed)
Pt came back to rm 23 from endo. Reinitiated tele. VSS. Call bell within reach.   Lawson Radar, RN

## 2023-01-15 NOTE — Evaluation (Signed)
Occupational Therapy Evaluation and Discharge Patient Details Name: Megan Espinoza MRN: [id number] DOB: [birth year] Today's Date: 01/15/2023   History of Present Illness 68 yo female presents to [facility] on 9/24 with slurred speech, fall at hair salon. Pt found to be anemic with guaiac +, UTI, and MRI with small R frontal corona radiata CVA. PMH includes afib, HTN, UE incoordination, obesity, COPD.   Clinical Impression   At baseline, pt is Independent to Mod I with ADLs, IADLs, and functional mobility without an AD. Pt now presents at or near baseline PLOF with pt demonstrating ability to complete ADLs and functional mobility/transfers Independent to Mod I with mildly increased time. Pt presents with vision at baseline and with B UE strength, ROM, and coordination WFL with no asymmetry noted. VSS on RA throughout session. No further benefit from acute skilled OT services at this time and no equipment needs identified. No post acute OT follow up indicated at this time. OT is signing off.       If plan is discharge home, recommend the following: Assistance with cooking/housework;Assist for transportation (PRN assist for meal prep and home management tasks)    Functional Status Assessment  Patient has not had a recent decline in their functional status  Equipment Recommendations  None recommended by OT (Pt already has needed equipment.)    Recommendations for Other Services       Precautions / Restrictions Precautions Precautions: Fall Precaution Comments: fall at nail salon because her shoe got caughter under a chair Restrictions Weight Bearing Restrictions: No      Mobility Bed Mobility Overal bed mobility: Independent, Modified Independent             General bed mobility comments: Pt requiring mildly increased time to get to EOB and to elevated B UE into bed.    Transfers Overall transfer level: Independent                        Balance Overall balance  assessment: No apparent balance deficits (not formally assessed)                                         ADL either performed or assessed with clinical judgement   ADL Overall ADL's : Independent;At baseline;Modified independent                                       General ADL Comments: Pt demonstrates ability to complete all ADLs and functional transfers/mobility Independent to Mod I, requiring mildly increased time for LB ADLs and use of shower chair during bathing. Pt reports she feels she is back to PLOF.     Vision Baseline Vision/History: 6 Macular Degeneration (Hx B cataracts with subsequent surgery) Ability to See in Adequate Light: 1 Impaired Patient Visual Report: No change from baseline       Perception         Praxis         Pertinent Vitals/Pain Pain Assessment Pain Assessment: No/denies pain     Extremity/Trunk Assessment Upper Extremity Assessment Upper Extremity Assessment: Right hand dominant;Overall [facility] for tasks assessed (No asymmetry noted in strength, ROM, or coordination)   Lower Extremity Assessment Lower Extremity Assessment: Defer to PT evaluation   Cervical / Trunk Assessment Cervical / Trunk  Assessment: Other exceptions Cervical / Trunk Exceptions: increased body habitus   Communication Communication Communication: No apparent difficulties   Cognition Arousal: Alert Behavior During Therapy: WFL for tasks assessed/performed, Agitated, Impulsive (Overall WFL. Pt with mild agitation noted related to to still being in the hospital and with occasional impulsiveness of movement.) Overall Cognitive Status: Within Functional Limits for tasks assessed                                 General Comments: AAOx4 and able to follow 1 and 2-step commands consistently     General Comments  VSS on RA throughout session. Husband present throughout session.    Exercises     Shoulder Instructions       Home Living Family/patient expects to be discharged to:: Private residence Living Arrangements: Spouse/significant other Available Help at Discharge: Family Type of Home: House Home Access: Stairs to enter Secretary/administrator of Steps: 3 Entrance Stairs-Rails: None Home Layout: One level     Bathroom Shower/Tub: Chief Strategy Officer: Standard     Home Equipment: Cane - single point;Shower seat          Prior Functioning/Environment Prior Level of Function : Independent/Modified Independent             Mobility Comments: Independent without an AD ADLs Comments: Independent to Mod I with all ADLs and IADLs        OT Problem List:        OT Treatment/Interventions:      OT Goals(Current goals can be found in the care plan section) Acute Rehab OT Goals Patient Stated Goal: To return home  OT Frequency:      Co-evaluation              AM-PAC OT "6 Clicks" Daily Activity     Outcome Measure Help from another person eating meals?: None Help from another person taking care of personal grooming?: None Help from another person toileting, which includes using toliet, bedpan, or urinal?: None Help from another person bathing (including washing, rinsing, drying)?: None Help from another person to put on and taking off regular upper body clothing?: None Help from another person to put on and taking off regular lower body clothing?: None 6 Click Score: 24   End of Session Nurse Communication: Mobility status;Other (comment) (OT signing off)  Activity Tolerance: Patient tolerated treatment well Patient left: in bed;with call bell/phone within reach;with family/visitor present  OT Visit Diagnosis: Other abnormalities of gait and mobility (R26.89);Other (comment) (Decreased activity tolerance)                Time: 4098-1191 OT Time Calculation (min): 16 min Charges:  OT General Charges $OT Visit: 1 Visit OT Evaluation $OT Eval Low  Complexity: 1 Low  Megan Espinoza "Megan Espinoza., OTR/L, MA Acute Rehab [phone]   Megan Espinoza 01/15/2023, 12:55 PM

## 2023-01-15 NOTE — Progress Notes (Signed)
Daily Progress Note Intern Pager: [phone]  Patient name: Megan Espinoza Medical record number: [id number] Date of birth: 1955-12-11 Age: 67 y.o. Gender: female  Primary Care Provider: Ellyn Hack, MD Consultants: Neurology, GI  Code Status: Full Code   Pt Overview and Major Events to Date:  9/24: Admitted + 1u PRBC 9/25: 1u PRBC 9/26: Pt failed bowel prep, unable to get EGD/Colonscopy 9/27: EGD/Colonscopy  Assessment and Plan: Megan Espinoza is a 67 y.o. female with past medical history of Afib on Xarelto and HTN presenting with symptomatic anemia secondary to possible GIB and aphasia found to have a small ischemic stroke.  Assessment & Plan Symptomatic anemia Likely in the setting of GIB. Notable iron deficiency anemia Fe 9, Sat ratio 2, Ferritin 9. Hgb upon admission 7.0, improved to 8.4 s/p 2u PRBC. EGD/Colonoscopy today. EGD revealed 3 nonbleeding ulcers were found and were biopsied. Thought GI bleed most likely 2/2 to pyloric ulcers and/ or Sheria Lang' s erosions. Colonoscopy revealed diverticulosis in the sigmoid and descending colon, one 4 mm polyp in the sigmoid colon, and non-bleeding internal hemorrhoids.  - Monitor with PM CBC - 2 large-bore IVs in place - Protonix 40 mg BID x 10 weeks and then daily - Switching Xarelto to Eliquis starting 9/28 Acute ischemic stroke [facility]) CT head w/o contrast demonstrated age indeterminate hypodensity/lacunar infarct within the left white matter. MR brain w/o contrast 1.1cm acute ischemic nonhemorrhagic infarct involving the R frontal corona radiata. Lipid panel demonstrated LDL 78, HDL 35, and TG 106, will start statin therapy. A1c well controlled at 5.1. Passed swallow study, will change diet depending on GI. - Continue Atorvastatin 40 mg - Continue ASA 81 mg - Start Eliquis tomorrow, education provided by pharmacy and discussed the inc risk of bleeding with Eliquis in setting of GIB, patient understood the risks vs  benefits with her A-fib - Permissive HTN per neuro, will restart Amlodipine 10 mg upon discharge - Cardiac monitoring - PT/OT - recommended no follow up  Chronic and Stable Problems:  Atrial Fibrillation: On Xarelto 20 mg daily, held for possible GIB HTN: Held home amlodipine 10 mg tablet, hydrochlorothiazide 12.5 mg, and metoprolol XR 25 mg for permissive HTN   FEN/GI: Heart Healthy Diet PPx: SCDs Dispo: pending EGD/Colonscopy    Subjective:  Patient is s/p EGD and Colonoscopy this morning. Doing well post EGD/Colonoscopy. Feels as if she is ready to go home and feels no discomfort. Discussed the risks of restarting Eliquis with her without close monitoring and the risk of bleeding, she understands and would still like to go home today if her post-op blood work is stable.  Objective: Temp:  [97.3 F (36.3 C)-98.5 F (36.9 C)] 97.9 F (36.6 C) (09/27 0800) Pulse Rate:  [90-96] 96 (09/27 0800) Resp:  [18] 18 (09/27 0800) BP: (119-145)/(52-96) 142/72 (09/27 0800) SpO2:  [92 %-94 %] 93 % (09/27 0800) Weight:  [147 kg] 127 kg (09/27 0800) Physical Exam: General: Awake and Alert in NAD HEENT: Normocephalic, atraumatic. Conjunctiva normal. No nasal discharge Cardiovascular: RRR. No M/R/G Respiratory: CTAB, normal WOB on RA. No wheezing, crackles, rhonchi, or diminished breath sounds. Abdomen: Soft, non-tender, non-distended. Bowel sounds normoactive Extremities: No BLE edema, no deformities or significant joint findings. Skin: Warm and dry. Neuro: A&Ox3. No focal neurological deficits.  Laboratory: Most recent CBC Lab Results  Component Value Date   WBC 6.4 01/14/2023   HGB 8.3 (L) 01/14/2023   HCT 28.5 (L) 01/14/2023   MCV  82.4 01/14/2023   PLT 282 01/14/2023   Most recent BMP    Latest Ref Rng & Units 01/14/2023    3:39 AM  BMP  Glucose 70 - 99 mg/dL 829   BUN 8 - 23 mg/dL 15   Creatinine 5.62 - 1.00 mg/dL 1.30   Sodium 865 - 784 mmol/L 139   Potassium 3.5 - 5.1  mmol/L 3.5   Chloride 98 - 111 mmol/L 106   CO2 22 - 32 mmol/L 24   Calcium 8.9 - 10.3 mg/dL 8.6     Imaging/Diagnostic Tests: EGD - Large hiatal hernia.  - Erosive gastropathy with no bleeding and no stigmata of recent bleeding.  - Non- bleeding gastric ulcers with a clean ulcer base ( [place] Class III) - Biopsied.  - Normal examined duodenum. Biopsied.  Colonoscopy - The examined portion of the ileum was normal.  - Diverticulosis in the sigmoid colon and in the descending colon.  - One 4 mm polyp in the sigmoid colon, removed with a cold snare. Resected and retrieved. - Non- bleeding internal hemorrhoids.  Fortunato Curling, DO 01/15/2023, 8:11 AM PGY-1, [facility]  FPTS Intern pager: [phone], text pages welcome Secure chat group [facility] Teaching Service

## 2023-01-15 NOTE — Care Management (Signed)
  Transition of Care [facility]) Screening Note   Patient Details  Name: Megan Espinoza Date of Birth: 1955-08-13   Transition of Care [facility]) CM/SW Contact:    Lockie Pares, RN Phone Number: 01/15/2023, 2:17 PM  Patient presented with strokelike symptoms and anemia. GI work up with colonoscopy and EGD today. Back to baseline function, no needs identified  Transition of Care Department [facility]) has reviewed patient and no TOC needs have been identified at this time. We will continue to monitor patient advancement through interdisciplinary progression rounds. If new patient transition needs arise, please place a TOC consult.

## 2023-01-15 NOTE — Interval H&P Note (Signed)
History and Physical Interval Note:  01/15/2023 8:07 AM  Megan Espinoza  has presented today for surgery, with the diagnosis of Anemia, hemocult positive.  The various methods of treatment have been discussed with the patient and family. After consideration of risks, benefits and other options for treatment, the patient has consented to  Procedure(s): COLONOSCOPY WITH PROPOFOL (N/A) ESOPHAGOGASTRODUODENOSCOPY (EGD) WITH PROPOFOL (N/A) as a surgical intervention.  The patient's history has been reviewed, patient examined, no change in status, stable for surgery.  I have reviewed the patient's chart and labs.  Questions were answered to the patient's satisfaction.     Imogene Burn

## 2023-01-15 NOTE — Assessment & Plan Note (Addendum)
CT head w/o contrast demonstrated age indeterminate hypodensity/lacunar infarct within the left white matter. MR brain w/o contrast 1.1cm acute ischemic nonhemorrhagic infarct involving the R frontal corona radiata. Lipid panel demonstrated LDL 78, HDL 35, and TG 106, will start statin therapy. A1c well controlled at 5.1. Passed swallow study, will change diet depending on GI. - Atorvastatin 40 mg - Continue ASA 81 mg - Start Xarelto tomorrow - Permissive HTN per neuro - Neuro checks q4 - Cardiac monitoring - PT/OT - Neuro on board, appreciate recs

## 2023-01-16 ENCOUNTER — Other Ambulatory Visit (HOSPITAL_COMMUNITY): Payer: Self-pay

## 2023-01-17 LAB — CULTURE, BLOOD (ROUTINE X 2)
Culture: NO GROWTH
Culture: NO GROWTH

## 2023-01-18 ENCOUNTER — Encounter (HOSPITAL_COMMUNITY): Payer: Self-pay | Admitting: Internal Medicine

## 2023-01-18 LAB — SURGICAL PATHOLOGY

## 2023-01-19 ENCOUNTER — Encounter: Payer: Self-pay | Admitting: Internal Medicine

## 2023-01-20 ENCOUNTER — Telehealth: Payer: Self-pay

## 2023-01-20 NOTE — Telephone Encounter (Signed)
Called patient to schedule follow up appointment no answer left message on voice mail to call office back.

## 2023-01-21 NOTE — Telephone Encounter (Signed)
Inbound call from patient returning phone call. Patient requesting a call back. Please advise, thank you.

## 2023-02-11 NOTE — Progress Notes (Signed)
Cardiology Office Note:    Date:  02/22/2023   ID:  KESLEE Espinoza, DOB 08/30/1955, MRN [id number]  PCP:  Ellyn Hack, MD   [facility] Providers Cardiologist:  None     Referring MD: Hillery Aldo, NP   Chief Complaint  Patient presents with   Atrial Fibrillation    History of Present Illness:    Megan Espinoza is a 67 y.o. female seen at the request of Hillery Aldo NP for evaluation of atrial fibrillation, CHF,  and aortic stenosis. She was initially diagnosed with AFib in July on a visit with her PCP. She was sent to ED byut  had converted. She was started on Xarelto and metoprolol. She was admitted Sept 24, 2024 with acute GI bleed with HGB of 7 and acute ischemic stroke. She was transfused. Xarelto held. Had panendoscopy showing pyloric ulcers and gastritis. Large hiatal hernia. One small colon polyp. She had a right frontal coronal radiata CVA managed medically. Recommended switching to Eliquis on DC. Echo showed normal LV function with LAE, moderate AS. Hgb at DC was 7.9.   She was readmitted 10/31-11/1/24 with acute SOB and edema. She was found to have acute pulmonary edema and diuresed with IV lasix. Was DC on lasix 40 mg daily. Her metoprolol dose was increased. She had developed hives on Eliquis so was switched back to Xarelto. Hgb was still low at 7.6. on Protonix twice a day. Now on oral iron therapy  Since DC from the hospital she  has felt well. Denies any SOB, edema, palpitations or weight gain. Watches sodium intake. Is unaware of any of her Afib. No chest pain  Past Medical History:  Diagnosis Date   Acute ischemic stroke (HCC) 01/13/2023   Aortic stenosis    ARDS (adult respiratory distress syndrome) (HCC) 03/28/2012   Heart murmur    Hypertension    Influenza, pneumonia 03/20/2012   Associated with ARDS, Septic shock   Obesity (BMI 30.0-34.9)    Symptomatic anemia 03/24/2012    Past Surgical History:  Procedure Laterality Date    BIOPSY  01/15/2023   Procedure: BIOPSY;  Surgeon: Imogene Burn, MD;  Location[facility] ENDOSCOPY;  Service: Gastroenterology;;   CHOLECYSTECTOMY     CHOLECYSTECTOMY     COLONOSCOPY WITH PROPOFOL N/A 01/15/2023   Procedure: COLONOSCOPY WITH PROPOFOL;  Surgeon: Imogene Burn, MD;  Location: [facility] ENDOSCOPY;  Service: Gastroenterology;  Laterality: N/A;   ESOPHAGOGASTRODUODENOSCOPY (EGD) WITH PROPOFOL N/A 01/15/2023   Procedure: ESOPHAGOGASTRODUODENOSCOPY (EGD) WITH PROPOFOL;  Surgeon: Imogene Burn, MD;  Location: [facility] ENDOSCOPY;  Service: Gastroenterology;  Laterality: N/A;   HERNIA REPAIR     POLYPECTOMY  01/15/2023   Procedure: POLYPECTOMY;  Surgeon: Imogene Burn, MD;  Location: [facility] ENDOSCOPY;  Service: Gastroenterology;;   TUBAL LIGATION      Current Medications: Current Meds  Medication Sig   acetaminophen (TYLENOL) 500 MG tablet Take 1,000 mg by mouth daily.   amLODipine (NORVASC) 10 MG tablet Take 10 mg by mouth daily.   atorvastatin (LIPITOR) 40 MG tablet Take 1 tablet (40 mg total) by mouth daily.   cyclobenzaprine (FLEXERIL) 10 MG tablet Take 10 mg by mouth at bedtime.   ferrous sulfate 325 (65 FE) MG EC tablet Take 1 tablet (325 mg total) by mouth every other day. Follow iron deficiency with PCP, follow with PCP for refills.   furosemide (LASIX) 40 MG tablet Take 1 tablet (40 mg total) by mouth daily.   metoprolol  succinate (TOPROL-XL) 50 MG 24 hr tablet Take 1 tablet (50 mg total) by mouth daily. Take with or immediately following a meal.   [START ON 03/26/2023] pantoprazole (PROTONIX) 40 MG tablet Take 1 tablet (40 mg total) by mouth 2 (two) times daily for 10 weeks (12/6), THEN 1 tablet (40 mg) once daily. (Patient taking differently: 1bid)   potassium chloride SA (KLOR-CON M) 20 MEQ tablet Take 1 tablet (20 mEq total) by mouth daily. Check labs with your PCP within 1 week (kidney function and electrolytes)   rivaroxaban (XARELTO) 20 MG TABS tablet Take 20 mg by mouth daily with  supper.     Allergies:   Eliquis [apixaban], Penicillins, Codeine, Erythromycin, and Sulfa antibiotics   Social History   Socioeconomic History   Marital status: Widowed    Spouse name: Not on file   Number of children: Not on file   Years of education: Not on file   Highest education level: Not on file  Occupational History   Not on file  Tobacco Use   Smoking status: Never   Smokeless tobacco: Never  Vaping Use   Vaping status: Never Used  Substance and Sexual Activity   Alcohol use: Yes    Comment: occasional   Drug use: No   Sexual activity: Not on file  Other Topics Concern   Not on file  Social History Narrative   Not on file   Social Determinants of Health   Financial Resource Strain: Not on file  Food Insecurity: No Food Insecurity (02/17/2023)   Hunger Vital Sign    Worried About Running Out of Food in the Last Year: Never true    Ran Out of Food in the Last Year: Never true  Transportation Needs: No Transportation Needs (02/17/2023)   PRAPARE - Administrator, Civil Service (Medical): No    Lack of Transportation (Non-Medical): No  Physical Activity: Not on file  Stress: Not on file  Social Connections: Not on file     Family History: The patient's family history includes Hypertension in an other family member; Lung cancer in her father; Pancreatic cancer in her mother.  ROS:   Please see the history of present illness.     All other systems reviewed and are negative.  EKGs/Labs/Other Studies Reviewed:    The following studies were reviewed today:  EKG Interpretation Date/Time:  Monday February 22 2023 08:41:08 EST Ventricular Rate:  100 PR Interval:  126 QRS Duration:  104 QT Interval:  354 QTC Calculation: 456 R Axis:   1  Text Interpretation: Sinus rhythm with Premature atrial complexes with Abberant conduction Incomplete right bundle branch block Nonspecific ST and T wave abnormality When compared with ECG of 18-Feb-2023  08:03, Sinus rhythm has replaced Atrial fibrillation Confirmed by [place], [place] Jolin [phone]) on 02/22/2023 8:46:28 AM   Echo 01/14/23: IMPRESSIONS     1. Left ventricular ejection fraction, by estimation, is 60 to 65%. The  left ventricle has normal function. The left ventricle has no regional  wall motion abnormalities. Left ventricular diastolic parameters were  normal.   2. Right ventricular systolic function is normal. The right ventricular  size is normal.   3. Left atrial size was moderately dilated.   4. The mitral valve is degenerative. Mild mitral valve regurgitation. No  evidence of mitral stenosis. Moderate mitral annular calcification.   5. The aortic valve is normal in structure. Aortic valve regurgitation is  not visualized. Moderate aortic valve stenosis. Aortic valve area,  by VTI  measures 1.29 cm. Aortic valve mean gradient measures 19.0 mmHg. Aortic  valve Vmax measures 2.88 m/s.   6. The inferior vena cava is dilated in size with >50% respiratory  variability, suggesting right atrial pressure of 8 mmHg.   7. Agitated saline contrast bubble study was negative, with no evidence  of any interatrial shunt.   EKG Interpretation Date/Time:  Monday February 22 2023 08:41:08 EST Ventricular Rate:  100 PR Interval:  126 QRS Duration:  104 QT Interval:  354 QTC Calculation: 456 R Axis:   1  Text Interpretation: Sinus rhythm with Premature atrial complexes with Abberant conduction Incomplete right bundle branch block Nonspecific ST and T wave abnormality When compared with ECG of 18-Feb-2023 08:03, Sinus rhythm has replaced Atrial fibrillation Confirmed by [place], [place] Mittman [phone]) on 02/22/2023 8:46:28 AM    Recent Labs: 01/13/2023: TSH 5.761 02/17/2023: B Natriuretic Peptide 168.7 02/18/2023: ALT 14; Magnesium 2.0 02/19/2023: BUN 20; Creatinine, Ser 1.07; Hemoglobin 7.6; Platelets 299; Potassium 4.3; Sodium 140  Recent Lipid Panel    Component Value Date/Time   CHOL 134  01/13/2023 0338   TRIG 106 01/13/2023 0338   HDL 35 (L) 01/13/2023 0338   CHOLHDL 3.8 01/13/2023 0338   VLDL 21 01/13/2023 0338   LDLCALC 78 01/13/2023 0338     Risk Assessment/Calculations:    CHA2DS2-VASc Score = 6   This indicates a 9.7% annual risk of stroke. The patient's score is based upon: CHF History: 1 HTN History: 1 Diabetes History: 0 Stroke History: 2 Vascular Disease History: 0 Age Score: 1 Gender Score: 1               Physical Exam:    VS:  BP 138/68 (BP Location: Right Arm, Patient Position: Sitting, Cuff Size: Large)   Pulse 100   Ht 5\' 6"  (1.676 m)   Wt 276 lb (125.2 kg)   SpO2 92%   BMI 44.55 kg/m     Wt Readings from Last 3 Encounters:  02/22/23 276 lb (125.2 kg)  02/19/23 273 lb 6.4 oz (124 kg)  01/15/23 280 lb (127 kg)     GEN:  Well nourished, obese,  in no acute distress HEENT: Normal NECK: No JVD; No carotid bruits LYMPHATICS: No lymphadenopathy CARDIAC: RRR with extrasystoles,  gr 2/6 harsh systolic murmur RUSB >> apex.  RESPIRATORY:  Clear to auscultation without rales, wheezing or rhonchi  ABDOMEN: Soft, non-tender, non-distended MUSCULOSKELETAL:  trace edema; No deformity  SKIN: Warm and dry NEUROLOGIC:  Alert and oriented x 3 PSYCHIATRIC:  Normal affect   ASSESSMENT:    1. Paroxysmal atrial fibrillation (HCC)   2. Physical deconditioning   3. Iron deficiency anemia, unspecified iron deficiency anemia type   4. Acute pulmonary edema (HCC)   5. Acute respiratory failure with hypoxia (HCC)   6. Chronic diastolic CHF (congestive heart failure) (HCC)   7. Moderate aortic stenosis - mean gradient 19.0 mmHg. peak gradient 33.2 mmHg    PLAN:    In order of problems listed above:  Paroxysmal AFib. Asymptomatic. Continue rate control strategy with Toprol XL. [place] Vasc score of 6. Continue Xarelto. Intolerant of Eliquis due to hives.  Acute on chronic diastolic CHF. Recent hospitalization. Improved on diuretic therapy.  Continue lasix 40 mg daily with potassium supplement. Sodium restriction. Monitor weights and edema. Consider adding SGLT 2 inhibitor but she has had a lot of medication added recently and is overwhelmed with this. Will wait for now.  Moderate AS. Gradient probably  higher than expected with her anemia. Will monitor. Needs yearly Echo Severe iron deficiency anemia. On Protonix BID. Follow up with GI. Continue oral iron. If Hgb does not improve may need iron infusions Morbid obesity. Will her Afib and CHF would consider sleep evaluation on next follow up  HTN controlled.            Medication Adjustments/Labs and Tests Ordered: Current medicines are reviewed at length with the patient today.  Concerns regarding medicines are outlined above.  Orders Placed This Encounter  Procedures   EKG 12-Lead   No orders of the defined types were placed in this encounter.   Patient Instructions  Medication Instructions:   No change  *If you need a refill on your cardiac medications before your next appointment, please call your pharmacy*  Lab Work: None  Testing/Procedures: None    Follow-Up: At [facility], you and your health needs are our priority.  As part of our continuing mission to provide you with exceptional heart care, we have created designated Provider Care Teams.  These Care Teams include your primary Cardiologist (physician) and Advanced Practice Providers (APPs -  Physician Assistants and Nurse Practitioners) who all work together to provide you with the care you need, when you need it.   Your next appointment:   May 25 2023 at 9:20 am   Provider:   Dr. [place]  Other Instructions    Signed, Merrick Maggio [place], MD  02/22/2023 9:21 AM    [facility]

## 2023-02-17 ENCOUNTER — Emergency Department (HOSPITAL_COMMUNITY): Payer: Medicare HMO

## 2023-02-17 ENCOUNTER — Other Ambulatory Visit: Payer: Self-pay

## 2023-02-17 ENCOUNTER — Encounter (HOSPITAL_COMMUNITY): Payer: Self-pay

## 2023-02-17 ENCOUNTER — Inpatient Hospital Stay (HOSPITAL_COMMUNITY)
Admission: EM | Admit: 2023-02-17 | Discharge: 2023-02-19 | DRG: 189 | Disposition: A | Discharge status: Home / Self Care | Payer: Medicare HMO | Attending: Family Medicine | Admitting: Family Medicine

## 2023-02-17 DIAGNOSIS — J9601 Acute respiratory failure with hypoxia: Secondary | ICD-10-CM | POA: Diagnosis not present

## 2023-02-17 DIAGNOSIS — Z23 Encounter for immunization: Secondary | ICD-10-CM

## 2023-02-17 DIAGNOSIS — Z885 Allergy status to narcotic agent status: Secondary | ICD-10-CM

## 2023-02-17 DIAGNOSIS — I350 Nonrheumatic aortic (valve) stenosis: Secondary | ICD-10-CM | POA: Diagnosis present

## 2023-02-17 DIAGNOSIS — E66811 Obesity, class 1: Secondary | ICD-10-CM | POA: Diagnosis present

## 2023-02-17 DIAGNOSIS — Z8673 Personal history of transient ischemic attack (TIA), and cerebral infarction without residual deficits: Secondary | ICD-10-CM

## 2023-02-17 DIAGNOSIS — Z6841 Body Mass Index (BMI) 40.0 and over, adult: Secondary | ICD-10-CM

## 2023-02-17 DIAGNOSIS — M7989 Other specified soft tissue disorders: Secondary | ICD-10-CM | POA: Diagnosis present

## 2023-02-17 DIAGNOSIS — Z7982 Long term (current) use of aspirin: Secondary | ICD-10-CM

## 2023-02-17 DIAGNOSIS — Z880 Allergy status to penicillin: Secondary | ICD-10-CM

## 2023-02-17 DIAGNOSIS — E66813 Obesity, class 3: Secondary | ICD-10-CM | POA: Diagnosis present

## 2023-02-17 DIAGNOSIS — I4821 Permanent atrial fibrillation: Secondary | ICD-10-CM | POA: Diagnosis not present

## 2023-02-17 DIAGNOSIS — Z7901 Long term (current) use of anticoagulants: Secondary | ICD-10-CM

## 2023-02-17 DIAGNOSIS — Z8249 Family history of ischemic heart disease and other diseases of the circulatory system: Secondary | ICD-10-CM

## 2023-02-17 DIAGNOSIS — I110 Hypertensive heart disease with heart failure: Secondary | ICD-10-CM | POA: Diagnosis present

## 2023-02-17 DIAGNOSIS — I5031 Acute diastolic (congestive) heart failure: Secondary | ICD-10-CM | POA: Diagnosis present

## 2023-02-17 DIAGNOSIS — Z8711 Personal history of peptic ulcer disease: Secondary | ICD-10-CM

## 2023-02-17 DIAGNOSIS — I4891 Unspecified atrial fibrillation: Secondary | ICD-10-CM

## 2023-02-17 DIAGNOSIS — Z881 Allergy status to other antibiotic agents status: Secondary | ICD-10-CM

## 2023-02-17 DIAGNOSIS — Z1152 Encounter for screening for COVID-19: Secondary | ICD-10-CM

## 2023-02-17 DIAGNOSIS — D509 Iron deficiency anemia, unspecified: Secondary | ICD-10-CM | POA: Diagnosis present

## 2023-02-17 DIAGNOSIS — J810 Acute pulmonary edema: Principal | ICD-10-CM | POA: Diagnosis present

## 2023-02-17 DIAGNOSIS — Z882 Allergy status to sulfonamides status: Secondary | ICD-10-CM

## 2023-02-17 LAB — HEPATIC FUNCTION PANEL
ALT: 15 U/L (ref 0–44)
AST: 20 U/L (ref 15–41)
Albumin: 3.5 g/dL (ref 3.5–5.0)
Alkaline Phosphatase: 104 U/L (ref 38–126)
Bilirubin, Direct: 0.2 mg/dL (ref 0.0–0.2)
Indirect Bilirubin: 0.4 mg/dL (ref 0.3–0.9)
Total Bilirubin: 0.6 mg/dL (ref 0.3–1.2)
Total Protein: 7.3 g/dL (ref 6.5–8.1)

## 2023-02-17 LAB — TROPONIN I (HIGH SENSITIVITY)
Troponin I (High Sensitivity): 8 ng/L (ref <18)
Troponin I (High Sensitivity): 8 ng/L (ref <18)

## 2023-02-17 LAB — CBC
HCT: 29.1 % — ABNORMAL LOW (ref 36.0–46.0)
Hemoglobin: 7.8 g/dL — ABNORMAL LOW (ref 12.0–15.0)
MCH: 21.5 pg — ABNORMAL LOW (ref 26.0–34.0)
MCHC: 26.8 g/dL — ABNORMAL LOW (ref 30.0–36.0)
MCV: 80.4 fL (ref 80.0–100.0)
Platelets: 260 10*3/uL (ref 150–400)
RBC: 3.62 MIL/uL — ABNORMAL LOW (ref 3.87–5.11)
RDW: 17.1 % — ABNORMAL HIGH (ref 11.5–15.5)
WBC: 6.2 10*3/uL (ref 4.0–10.5)
nRBC: 0 % (ref 0.0–0.2)

## 2023-02-17 LAB — RESP PANEL BY RT-PCR (RSV, FLU A&B, COVID)  RVPGX2
Influenza A by PCR: NEGATIVE
Influenza B by PCR: NEGATIVE
Resp Syncytial Virus by PCR: NEGATIVE
SARS Coronavirus 2 by RT PCR: NEGATIVE

## 2023-02-17 LAB — BASIC METABOLIC PANEL
Anion gap: 12 (ref 5–15)
BUN: 19 mg/dL (ref 8–23)
CO2: 23 mmol/L (ref 22–32)
Calcium: 9.3 mg/dL (ref 8.9–10.3)
Chloride: 109 mmol/L (ref 98–111)
Creatinine, Ser: 0.91 mg/dL (ref 0.44–1.00)
GFR, Estimated: >60 mL/min (ref >60)
Glucose, Bld: 122 mg/dL — ABNORMAL HIGH (ref 70–99)
Potassium: 4.1 mmol/L (ref 3.5–5.1)
Sodium: 144 mmol/L (ref 135–145)

## 2023-02-17 LAB — BRAIN NATRIURETIC PEPTIDE: B Natriuretic Peptide: 168.7 pg/mL — ABNORMAL HIGH (ref 0.0–100.0)

## 2023-02-17 MED ORDER — METOPROLOL SUCCINATE ER 25 MG PO TB24
25.0000 mg | ORAL_TABLET | Freq: Every day | ORAL | Status: DC
  Administered 2023-02-18 – 2023-02-19 (×2): 25 mg via ORAL
  Filled 2023-02-17 (×2): qty 1

## 2023-02-17 MED ORDER — FUROSEMIDE 10 MG/ML IJ SOLN
60.0000 mg | Freq: Once | INTRAMUSCULAR | Status: AC
  Administered 2023-02-17: 60 mg via INTRAVENOUS
  Filled 2023-02-17: qty 6

## 2023-02-17 MED ORDER — ALBUTEROL SULFATE (2.5 MG/3ML) 0.083% IN NEBU: 2.5000 mg | INHALATION_SOLUTION | RESPIRATORY_TRACT | Status: DC | PRN

## 2023-02-17 MED ORDER — ONDANSETRON HCL 4 MG PO TABS: 4.0000 mg | ORAL_TABLET | Freq: Four times a day (QID) | ORAL | Status: DC | PRN

## 2023-02-17 MED ORDER — ORAL CARE MOUTH RINSE
15.0000 mL | OROMUCOSAL | Status: DC | PRN
Start: 2023-02-17 — End: 2023-02-19

## 2023-02-17 MED ORDER — RIVAROXABAN 20 MG PO TABS
20.0000 mg | ORAL_TABLET | Freq: Every day | ORAL | Status: DC
  Administered 2023-02-17 – 2023-02-19 (×3): 20 mg via ORAL
  Filled 2023-02-17 (×3): qty 1

## 2023-02-17 MED ORDER — MELATONIN 5 MG PO TABS: 10.0000 mg | ORAL_TABLET | Freq: Every evening | ORAL | Status: DC | PRN

## 2023-02-17 MED ORDER — INFLUENZA VAC A&B SURF ANT ADJ 0.5 ML IM SUSY
0.5000 mL | PREFILLED_SYRINGE | INTRAMUSCULAR | Status: AC
  Administered 2023-02-19: 0.5 mL via INTRAMUSCULAR
  Filled 2023-02-17: qty 0.5

## 2023-02-17 MED ORDER — CYCLOBENZAPRINE HCL 10 MG PO TABS
10.0000 mg | ORAL_TABLET | Freq: Every day | ORAL | Status: DC
  Administered 2023-02-17 – 2023-02-18 (×2): 10 mg via ORAL
  Filled 2023-02-17 (×2): qty 1

## 2023-02-17 MED ORDER — PNEUMOCOCCAL 20-VAL CONJ VACC 0.5 ML IM SUSY
0.5000 mL | PREFILLED_SYRINGE | INTRAMUSCULAR | Status: AC
  Administered 2023-02-19: 0.5 mL via INTRAMUSCULAR
  Filled 2023-02-17: qty 0.5

## 2023-02-17 MED ORDER — PANTOPRAZOLE SODIUM 40 MG PO TBEC
40.0000 mg | DELAYED_RELEASE_TABLET | Freq: Two times a day (BID) | ORAL | Status: DC
  Administered 2023-02-17 – 2023-02-19 (×4): 40 mg via ORAL
  Filled 2023-02-17 (×4): qty 1

## 2023-02-17 MED ORDER — MAGNESIUM OXIDE -MG SUPPLEMENT 400 (240 MG) MG PO TABS
400.0000 mg | ORAL_TABLET | Freq: Two times a day (BID) | ORAL | Status: DC
  Administered 2023-02-18 – 2023-02-19 (×3): 400 mg via ORAL
  Filled 2023-02-17 (×3): qty 1

## 2023-02-17 MED ORDER — ACETAMINOPHEN 500 MG PO TABS: 1000.0000 mg | ORAL_TABLET | Freq: Four times a day (QID) | ORAL | Status: DC | PRN

## 2023-02-17 MED ORDER — ACETAMINOPHEN 650 MG RE SUPP
650.0000 mg | Freq: Four times a day (QID) | RECTAL | Status: DC | PRN
Start: 2023-02-17 — End: 2023-02-19

## 2023-02-17 MED ORDER — ONDANSETRON HCL 4 MG/2ML IJ SOLN: 4.0000 mg | Freq: Four times a day (QID) | INTRAMUSCULAR | Status: DC | PRN

## 2023-02-17 MED ORDER — FUROSEMIDE 10 MG/ML IJ SOLN: 40.0000 mg | Freq: Two times a day (BID) | INTRAMUSCULAR | Status: DC

## 2023-02-17 MED ORDER — ATORVASTATIN CALCIUM 40 MG PO TABS
40.0000 mg | ORAL_TABLET | Freq: Every day | ORAL | Status: DC
  Administered 2023-02-18 – 2023-02-19 (×2): 40 mg via ORAL
  Filled 2023-02-17 (×2): qty 1

## 2023-02-17 MED ORDER — POTASSIUM CHLORIDE CRYS ER 20 MEQ PO TBCR: 20.0000 meq | EXTENDED_RELEASE_TABLET | Freq: Two times a day (BID) | ORAL | Status: DC

## 2023-02-17 MED ORDER — PANTOPRAZOLE SODIUM 40 MG PO TBEC: 40.0000 mg | DELAYED_RELEASE_TABLET | Freq: Every day | ORAL | Status: DC

## 2023-02-17 NOTE — ED Provider Notes (Signed)
[place] EMERGENCY DEPARTMENT AT [facility] Provider Note   CSN: [id number] Arrival date & time: 02/17/23  1022     History  Chief Complaint  Patient presents with   Shortness of Breath   Leg Swelling    Megan Espinoza is a 67 y.o. female.  Patient here with shortness of breath and leg swelling.  Progressively worsening over the last few days.  Denies any history of heart failure.  No cough or sputum production.  No history of blood clots.  She is on Eliquis for A-fib.  Denies any weakness numbness tingling.  No fever or chills.  The history is provided by the patient.       Home Medications Prior to Admission medications   Medication Sig Start Date End Date Taking? Authorizing Provider  acetaminophen (TYLENOL) 325 MG tablet Take 2 tablets (650 mg total) by mouth every 6 (six) hours as needed (or Fever >/= 101). 04/14/12   Simonne Martinet, NP  albuterol (VENTOLIN HFA) 108 (90 Base) MCG/ACT inhaler Inhale 2 puffs into the lungs every 4 (four) hours as needed for wheezing or shortness of breath. 07/19/19   Mickie Bail, NP  amLODipine (NORVASC) 10 MG tablet Take 10 mg by mouth daily. 06/21/21   [provider]  apixaban (ELIQUIS) 5 MG TABS tablet Take 1 tablet (5 mg total) by mouth 2 (two) times daily. 01/16/23   Fortunato Curling, DO  aspirin EC 81 MG tablet Take 81 mg by mouth daily. Swallow whole.    [provider]  atorvastatin (LIPITOR) 40 MG tablet Take 1 tablet (40 mg total) by mouth daily. 01/16/23   Fortunato Curling, DO  cyclobenzaprine (FLEXERIL) 10 MG tablet Take 10 mg by mouth at bedtime. 12/31/22   [provider]  pantoprazole (PROTONIX) 40 MG tablet Take 1 tablet (40 mg total) by mouth 2 (two) times daily for 10 weeks (12/6), THEN 1 tablet (40 mg) once daily. 03/26/23 09/02/23  Levin Erp, MD      Allergies    Penicillins, Codeine, Erythromycin, and Sulfa antibiotics    Review of Systems   Review of Systems  Physical  Exam Updated Vital Signs BP (!) 154/97   Pulse 98   Temp 98.8 F (37.1 C) (Oral)   Resp 19   Ht 5\' 6"  (1.676 m)   Wt 95.3 kg   SpO2 97%   BMI 33.89 kg/m  Physical Exam Vitals and nursing note reviewed.  Constitutional:      General: She is not in acute distress.    Appearance: She is well-developed. She is not ill-appearing.  HENT:     Head: Normocephalic and atraumatic.     Mouth/Throat:     Mouth: Mucous membranes are moist.  Eyes:     Conjunctiva/sclera: Conjunctivae normal.     Pupils: Pupils are equal, round, and reactive to light.  Cardiovascular:     Rate and Rhythm: Normal rate and regular rhythm.     Pulses: Normal pulses.     Heart sounds: Normal heart sounds. No murmur heard. Pulmonary:     Effort: Tachypnea present.     Breath sounds: Decreased breath sounds present.  Abdominal:     Palpations: Abdomen is soft.     Tenderness: There is no abdominal tenderness.  Musculoskeletal:        General: No swelling.     Cervical back: Normal range of motion and neck supple.     Right lower leg: Edema present.  Left lower leg: Edema present.  Skin:    General: Skin is warm and dry.     Capillary Refill: Capillary refill takes less than 2 seconds.  Neurological:     Mental Status: She is alert.  Psychiatric:        Mood and Affect: Mood normal.     ED Results / Procedures / Treatments   Labs (all labs ordered are listed, but only abnormal results are displayed) Labs Reviewed  BASIC METABOLIC PANEL - Abnormal; Notable for the following components:      Result Value   Glucose, Bld 122 (*)    All other components within normal limits  CBC - Abnormal; Notable for the following components:   RBC 3.62 (*)    Hemoglobin 7.8 (*)    HCT 29.1 (*)    MCH 21.5 (*)    MCHC 26.8 (*)    RDW 17.1 (*)    All other components within normal limits  RESP PANEL BY RT-PCR (RSV, FLU A&B, COVID)  [id number]  HEPATIC FUNCTION PANEL  BRAIN NATRIURETIC PEPTIDE  TROPONIN I  (HIGH SENSITIVITY)  TROPONIN I (HIGH SENSITIVITY)  TROPONIN I (HIGH SENSITIVITY)    EKG EKG Interpretation Date/Time:  Wednesday February 17 2023 10:35:22 EDT Ventricular Rate:  110 PR Interval:  136 QRS Duration:  100 QT Interval:  370 QTC Calculation: 500 R Axis:   5  Text Interpretation: Sinus tachycardia with Premature supraventricular complexes Incomplete right bundle branch block Cannot rule out Anterior infarct , age undetermined Abnormal ECG When compared with ECG of 12-Jan-2023 14:42, PREVIOUS ECG IS PRESENT Confirmed by Virgina Norfolk [phone]) on 02/17/2023 11:12:41 AM  Radiology DG Chest Portable 1 View  Result Date: 02/17/2023 CLINICAL DATA:  Shortness of breath. EXAM: PORTABLE CHEST 1 VIEW COMPARISON:  01/12/2023. FINDINGS: There are increased interstitial markings with bilateral hilar and bibasilar predominance. There are probable superimposed opacities in the bilateral lower lobes, which may represent atelectasis and/or pneumonia. There is blunting of bilateral lateral costophrenic angles, favoring bilateral small pleural effusions. No pneumothorax. Stable moderately enlarged cardio-mediastinal silhouette. No acute osseous abnormalities. The soft tissues are within normal limits. IMPRESSION: *Findings favor congestive heart failure/pulmonary edema. Correlate clinically to exclude superimposed pneumonia in the bilateral lower lobes. Electronically Signed   By: Jules Schick M.D.   On: 02/17/2023 13:13    Procedures Procedures    Medications Ordered in ED Medications  furosemide (LASIX) injection 60 mg (has no administration in time range)    ED Course/ Medical Decision Making/ A&P Clinical Course as of 02/17/23 1513  Wed Feb 17, 2023  1502 Assumed care from Dr Lockie Mola. 67 yo F of AF on xarelto and GIB with sob and progressive leg swelling over several days. Had GI bleed recently and was admitted but no bloody stool recently. Has new 2L oxygen requirement. Cxr with  pulmonary edema. Did stop hydrochlorothiazide recently.  [RP]    Clinical Course User Index [RP] Rondel Baton, MD                                 Medical Decision Making Amount and/or Complexity of Data Reviewed Labs: ordered. Radiology: ordered.  Risk Prescription drug management.   Megan Espinoza is here with shortness of breath and leg swelling.  Unremarkable vitals.  No fever.  She appears to have sinus rhythm with PVCs and mildly tachycardic on EKG per my review interpretation of EKG.  She looks volume overloaded on exam with leg swelling.  Coarse breath sounds.  She is hypoxic upon arrival to triage to 83%.  Stable on 2 L of oxygen.  No smoking history.  She has not been on hydrochlorothiazide but she denies heart failure.  She denies cough or fever or sputum production.  She is on blood thinners for her A-fib.  Recently in the hospital for GI bleed and then ended up with a stroke.  She has been back on Xarelto now.  She denies any black or bloody stools.  Overall differential diagnosis likely volume overload with concern for may be heart failure may be she has been in that A-fib that has caused that but she denies any chest pain seems less likely to be ACS, she is anticoagulated and seems less likely to be PE, seems less likely to be infectious process.  Could be anemia related as well.  Will get CBC CMP BNP troponin chest x-ray and reevaluate.  Overall per my review and interpretation of chest x-ray I do think findings are consistent with congestive heart failure and pulmonary edema.  She has no leukocytosis.  Hemoglobin is at baseline.  Troponin is 8.  Per my chart review she did have a echocardiogram last month that was fairly unremarkable.  But she did have a GI bleed and overall suspect may be anemia here recently and may be some A-fib causing some strain to the heart.  I have given her dose IV Lasix.  We are awaiting remaining blood work and anticipate admission to  hospitalist for further care.  Overall I think volume overload in the setting of cardiac etiology.  She is on blood thinner.  I have much lower concern for PE.  She is stable on 2 L of oxygen which is a new oxygen requirement.  This chart was dictated using voice recognition software.  Despite best efforts to proofread,  errors can occur which can change the documentation meaning. -        Final Clinical Impression(s) / ED Diagnoses Final diagnoses:  Acute pulmonary edema (HCC)  Acute respiratory failure with hypoxia [facility])    Rx / DC Orders ED Discharge Orders     None         Virgina Norfolk, DO 02/17/23 1513

## 2023-02-17 NOTE — H&P (Signed)
History and Physical    Megan Espinoza WGN:[id number] DOB: 1955/08/19 DOA: 02/17/2023  DOS: the patient was seen and examined on 02/17/2023  PCP: Ellyn Hack, MD   Patient coming from: Home  I have personally briefly reviewed patient's old medical records in [facility]  CC: SOB, LE edema HPI: 67 year old Caucasian female history of obesity BMI of 33.89, hypertension, history of persistent/chronic A-fib, recent stroke in September 2024 presents to the ER today with worsening shortness of breath for the last 3 to 4 days.  She has also noticed increasing lower extreme edema for about a week or 2.  She was discharged at the end of September after having an ischemic stroke.  Patient states about 2 to 3 days after she was discharged on Eliquis, she broke out in a whole body rash/hives.  She was seen in urgent care and prescribed some prednisone.  About a day later she was called by urgent care and told to stop her HCTZ.  She has been off HCTZ for about 3 weeks.  She has noticed increasing edema over the last 2 weeks.  Patient brought to the ER due to shortness of breath and lower extremity edema.  On arrival temp 98.8 heart rate 108 blood pressure 148/72 satting 83% on room air  White count 6.2, hemoglobin 7.8, platelets of 260  BNP 168  Sodium 144, potassium 4.1, chloride 109, bicarb 23, BUN of 19, creatinine 0.9  Chest x-ray on my read shows pulmonary  EKG shows A-fib with a heart rate of 110  Patient given 60 mg of IV Lasix.  She has had good response to Lasix.  [facility] hospitalist consulted.   ED Course: given lasix 60 mg IV x 1  Review of Systems:  Review of Systems  Constitutional: Negative.   HENT: Negative.    Eyes: Negative.   Respiratory:  Positive for shortness of breath.   Cardiovascular:  Positive for leg swelling.       +orthopnea  Gastrointestinal: Negative.   Genitourinary: Negative.   Musculoskeletal:  Positive for back pain.       Chronic  back pain  Skin: Negative.   Neurological: Negative.   Endo/Heme/Allergies: Negative.   Psychiatric/Behavioral: Negative.    All other systems reviewed and are negative.   Past Medical History:  Diagnosis Date   Acute ischemic stroke (HCC) 01/13/2023   ARDS (adult respiratory distress syndrome) (HCC) 03/28/2012   Influenza, pneumonia 03/20/2012   Associated with ARDS, Septic shock   Obesity (BMI 30.0-34.9)    Symptomatic anemia 03/24/2012    Past Surgical History:  Procedure Laterality Date   BIOPSY  01/15/2023   Procedure: BIOPSY;  Surgeon: Imogene Burn, MD;  Location: [facility] ENDOSCOPY;  Service: Gastroenterology;;   CHOLECYSTECTOMY     COLONOSCOPY WITH PROPOFOL N/A 01/15/2023   Procedure: COLONOSCOPY WITH PROPOFOL;  Surgeon: Imogene Burn, MD;  Location: [facility] ENDOSCOPY;  Service: Gastroenterology;  Laterality: N/A;   ESOPHAGOGASTRODUODENOSCOPY (EGD) WITH PROPOFOL N/A 01/15/2023   Procedure: ESOPHAGOGASTRODUODENOSCOPY (EGD) WITH PROPOFOL;  Surgeon: Imogene Burn, MD;  Location: [facility] ENDOSCOPY;  Service: Gastroenterology;  Laterality: N/A;   HERNIA REPAIR     POLYPECTOMY  01/15/2023   Procedure: POLYPECTOMY;  Surgeon: Imogene Burn, MD;  Location: [facility] ENDOSCOPY;  Service: Gastroenterology;;     reports that she has never smoked. She has never used smokeless tobacco. She reports current alcohol use. She reports that she does not use drugs.  Allergies  Allergen Reactions  Eliquis [Apixaban] Anaphylaxis   Penicillins Swelling   Codeine Nausea And Vomiting   Erythromycin     Mouth thrush- per pt   Sulfa Antibiotics     Mouth thrush- per pt    Family History  Problem Relation Age of Onset   Hypertension Other     Prior to Admission medications   Medication Sig Start Date End Date Taking? Authorizing Provider  acetaminophen (TYLENOL) 500 MG tablet Take 1,000 mg by mouth daily.   Yes [provider]  amLODipine (NORVASC) 10 MG tablet Take 10 mg by mouth daily. 06/21/21   Yes [provider]  atorvastatin (LIPITOR) 40 MG tablet Take 1 tablet (40 mg total) by mouth daily. 01/16/23  Yes Fortunato Curling, DO  cyclobenzaprine (FLEXERIL) 10 MG tablet Take 10 mg by mouth at bedtime. 12/31/22  Yes [provider]  metoprolol succinate (TOPROL-XL) 25 MG 24 hr tablet Take 25 mg by mouth daily.   Yes [provider]  pantoprazole (PROTONIX) 40 MG tablet Take 1 tablet (40 mg total) by mouth 2 (two) times daily for 10 weeks (12/6), THEN 1 tablet (40 mg) once daily. Patient taking differently: 1bid 03/26/23 09/02/23 Yes Levin Erp, MD  rivaroxaban (XARELTO) 20 MG TABS tablet Take 20 mg by mouth daily with supper.   Yes [provider]    Physical Exam: Vitals:   02/17/23 1215 02/17/23 1522 02/17/23 1523 02/17/23 1755  BP: (!) 154/97 133/80  (!) 150/88  Pulse: 98 (!) 109  75  Resp: 19 18    Temp:   97.9 F (36.6 C)   TempSrc:   Oral   SpO2: 97% 94%  92%  Weight:      Height:        Physical Exam Vitals and nursing note reviewed.  Constitutional:      General: She is not in acute distress.    Appearance: She is obese. She is not toxic-appearing or diaphoretic.  HENT:     Head: Normocephalic and atraumatic.     Nose: Nose normal.  Eyes:     General: No scleral icterus. Cardiovascular:     Rate and Rhythm: Tachycardia present. Rhythm irregular.     Heart sounds: Murmur heard.  Pulmonary:     Effort: Pulmonary effort is normal.     Breath sounds: No rales.  Abdominal:     General: Bowel sounds are normal. There is no distension.     Palpations: Abdomen is soft.     Tenderness: There is no abdominal tenderness.  Musculoskeletal:     Right lower leg: 2+ Edema present.     Left lower leg: 2+ Edema present.     Right ankle: Swelling present.     Left ankle: Swelling present.     Right foot: Swelling present.     Left foot: Swelling present.     Comments: +2 pitting bilateral pretibial, ankle and pedal edema  Skin:     General: Skin is warm and dry.     Capillary Refill: Capillary refill takes less than 2 seconds.  Neurological:     General: No focal deficit present.     Mental Status: She is alert and oriented to person, place, and time.      Labs on Admission: I have personally reviewed following labs and imaging studies  CBC: Recent Labs  Lab 02/17/23 1214  WBC 6.2  HGB 7.8*  HCT 29.1*  MCV 80.4  PLT 260   Basic Metabolic Panel: Recent Labs  Lab 02/17/23 1214  NA 144  K 4.1  CL 109  CO2 23  GLUCOSE 122*  BUN 19  CREATININE 0.91  CALCIUM 9.3   GFR: Estimated Creatinine Clearance: 69.8 mL/min (by C-G formula based on SCr of 0.91 mg/dL). Liver Function Tests: Recent Labs  Lab 02/17/23 1214  AST 20  ALT 15  ALKPHOS 104  BILITOT 0.6  PROT 7.3  ALBUMIN 3.5   Cardiac Enzymes: Recent Labs  Lab 02/17/23 1214 02/17/23 1422  TROPONINIHS 8 8   BNP (last 3 results) Recent Labs    02/17/23 1422  BNP 168.7*   Radiological Exams on Admission: I have personally reviewed images DG Chest Portable 1 View  Result Date: 02/17/2023 CLINICAL DATA:  Shortness of breath. EXAM: PORTABLE CHEST 1 VIEW COMPARISON:  01/12/2023. FINDINGS: There are increased interstitial markings with bilateral hilar and bibasilar predominance. There are probable superimposed opacities in the bilateral lower lobes, which may represent atelectasis and/or pneumonia. There is blunting of bilateral lateral costophrenic angles, favoring bilateral small pleural effusions. No pneumothorax. Stable moderately enlarged cardio-mediastinal silhouette. No acute osseous abnormalities. The soft tissues are within normal limits. IMPRESSION: *Findings favor congestive heart failure/pulmonary edema. Correlate clinically to exclude superimposed pneumonia in the bilateral lower lobes. Electronically Signed   By: Jules Schick M.D.   On: 02/17/2023 13:13    EKG: My personal interpretation of EKG shows: sinus  tachycardia    Assessment/Plan Principal Problem:   Acute pulmonary edema (HCC) Active Problems:   Acute respiratory failure with hypoxia (HCC)   Moderate aortic stenosis - mean gradient 19.0 mmHg. peak gradient 33.2 mmHg   Permanent atrial fibrillation (HCC)   Obesity (BMI 30.0-34.9)   Iron deficiency anemia   History of ischemic stroke - 01-12-2023    Assessment and Plan: * Acute pulmonary edema (HCC) Admit to observation med/tele bed. Continue with IV lasix 40 mg bid. Likely caused by recent cessation of hydrochlorothiazide(which pt has been taking for years), moderate AS noted on echo, recent PRBC transfusion without diuresis and persistent anemia. No hx of diastolic CHF.  Acute respiratory failure with hypoxia (HCC) RA sats 83%. Placed on supplemental O2. Not on home O2. Caused by pulmonary edema.  Permanent atrial fibrillation (HCC) Continue with Toprol-XL for rate control. And xarelto for CVA prophylaxis.  Pt states that Eliquis caused severe hives.  Moderate aortic stenosis - mean gradient 19.0 mmHg. peak gradient 33.2 mmHg Moderate AS noted on recent echo for her CVA workup. Does not appear to be a low flow state. Normal LVEF 65%.  History of ischemic stroke - 01-12-2023 Recent dx'd in September 2024. On DOAC.  Iron deficiency anemia Noted to have peptic ulcers on EGD last month. Placed on bid protonix. Was not started on po iron. Will start po iron at discharge as not to cause black stools during hospitalization.  Obesity (BMI 30.0-34.9) BMI 33.89    DVT prophylaxis: Xarelto Code Status: Full Code Family Communication: discussed with pt and significant other(Melvin) at bedside Disposition Plan: return home  Consults called: none  Admission status: Observation, Telemetry bed   Carollee Herter, DO [facility] 02/17/2023, 5:56 PM

## 2023-02-17 NOTE — ED Notes (Signed)
Walked patient to the bathroom patient did well 

## 2023-02-17 NOTE — Assessment & Plan Note (Signed)
Moderate AS noted on recent echo for her CVA workup. Does not appear to be a low flow state. Normal LVEF 65%.  02-18-2023 monitor for overdiuresis.

## 2023-02-17 NOTE — Assessment & Plan Note (Signed)
Recent dx'd in September 2024. On DOAC. 02-18-2023 continue with xarelto.

## 2023-02-17 NOTE — Assessment & Plan Note (Addendum)
Admit to observation med/tele bed. Continue with IV lasix 40 mg bid. Likely caused by recent cessation of hydrochlorothiazide(which pt has been taking for years), moderate AS noted on echo, recent PRBC transfusion without diuresis and persistent anemia. No hx of diastolic CHF.  02-18-2023. Scr rose to 1.11 from 0.9 yesterday. Pt did receive 60 mg IV lasix x 1 in ER. Will reduce IV lasix to 20 mg bid. Monitor Scr.

## 2023-02-17 NOTE — Assessment & Plan Note (Signed)
RA sats 83%. Placed on supplemental O2. Not on home O2. Caused by pulmonary edema.  02-18-2023 wean to RA as tolerated

## 2023-02-17 NOTE — ED Triage Notes (Signed)
Pt. Stated, I was in hospital 2 weeks ago for a slight  stroke and discharged with Elaquis and it caused me to have hives unreal , so I went back on Xralto for A-Fib and they took me off of the hydrochlorothiazide, I think Im retaining fluid because I dont have the hydrochlorothiazide. Im mostly SOB that started on Sunday. Today even when sitting down Im unable to catvh my breath.

## 2023-02-17 NOTE — Assessment & Plan Note (Addendum)
Continue with Toprol-XL for rate control. And xarelto for CVA prophylaxis.  Pt states that Eliquis caused severe hives.  Keep K >4 and Mg >2.0 during diuresis.  02-18-2023 in rapid afib this AM. Start cardizem gtts.

## 2023-02-17 NOTE — Hospital Course (Signed)
CC: SOB, LE edema HPI: 67 year old Caucasian female history of obesity BMI of 33.89, hypertension, history of persistent/chronic A-fib, recent stroke in September 2024 presents to the ER today with worsening shortness of breath for the last 3 to 4 days.  She has also noticed increasing lower extreme edema for about a week or 2.  She was discharged at the end of September after having an ischemic stroke.  Patient states about 2 to 3 days after she was discharged on Eliquis, she broke out in a whole body rash/hives.  She was seen in urgent care and prescribed some prednisone.  About a day later she was called by urgent care and told to stop her HCTZ.  She has been off HCTZ for about 3 weeks.  She has noticed increasing edema over the last 2 weeks.  Patient brought to the ER due to shortness of breath and lower extremity edema.  On arrival temp 98.8 heart rate 108 blood pressure 148/72 satting 83% on room air  White count 6.2, hemoglobin 7.8, platelets of 260  BNP 168  Sodium 144, potassium 4.1, chloride 109, bicarb 23, BUN of 19, creatinine 0.9  Chest x-ray on my read shows pulmonary  EKG shows A-fib with a heart rate of 110  Patient given 60 mg of IV Lasix.  She has had good response to Lasix.  [facility] hospitalist consulted.

## 2023-02-17 NOTE — ED Provider Notes (Signed)
  Physical Exam  BP (!) 154/97   Pulse 98   Temp 98.8 F (37.1 C) (Oral)   Resp 19   Ht 5\' 6"  (1.676 m)   Wt 95.3 kg   SpO2 97%   BMI 33.89 kg/m   Physical Exam  Procedures  Procedures  ED Course / MDM   Clinical Course as of 02/17/23 1504  Wed Feb 17, 2023  1502 Assumed care from Dr Lockie Mola. 67 yo F of AF on xarelto and GIB with sob and progressive leg swelling over several days. Had GI bleed recently and was admitted but no bloody stool recently. Has new 2L oxygen requirement. Cxr with pulmonary edema.  [RP]    Clinical Course User Index [RP] Rondel Baton, MD   Medical Decision Making Amount and/or Complexity of Data Reviewed Labs: ordered. Radiology: ordered.  Risk Prescription drug management.   ***

## 2023-02-17 NOTE — Assessment & Plan Note (Signed)
BMI 33.89

## 2023-02-17 NOTE — ED Notes (Signed)
ED TO INPATIENT HANDOFF REPORT  ED Nurse Name and Phone #: Rodney Booze [phone]  S Name/Age/Gender Megan Espinoza 67 y.o. female Room/Bed: 030C/030C  Code Status   Code Status: Full Code  Home/SNF/Other Home Patient oriented to: self, place, time, and situation Is this baseline? Yes   Triage Complete: Triage complete  Chief Complaint Acute pulmonary edema (HCC) [J81.0]  Triage Note Pt. Stated, I was in hospital 2 weeks ago for a slight  stroke and discharged with Elaquis and it caused me to have hives unreal , so I went back on Xralto for A-Fib and they took me off of the hydrochlorothiazide, I think Im retaining fluid because I dont have the hydrochlorothiazide. Im mostly SOB that started on Sunday. Today even when sitting down Im unable to catvh my breath.   Allergies Allergies  Allergen Reactions   Eliquis [Apixaban] Anaphylaxis   Penicillins Swelling   Codeine Nausea And Vomiting   Erythromycin     Mouth thrush- per pt   Sulfa Antibiotics     Mouth thrush- per pt    Level of Care/Admitting Diagnosis ED Disposition     ED Disposition  Admit   Condition  --   Comment  Hospital Area: [facility] [[id number]]  Level of Care: Telemetry Medical [104]  May place patient in observation at [facility] or [facility] if equivalent level of care is available:: No  Covid Evaluation: Asymptomatic - no recent exposure (last 10 days) testing not required  Diagnosis: Acute pulmonary edema [facility]) [[id number]]  Admitting Physician: Imogene Burn ERIC [[id number]]  Attending Physician: Imogene Burn, ERIC [[id number]]          B Medical/Surgery History Past Medical History:  Diagnosis Date   Acute ischemic stroke (HCC) 01/13/2023   ARDS (adult respiratory distress syndrome) (HCC) 03/28/2012   Influenza, pneumonia 03/20/2012   Associated with ARDS, Septic shock   Obesity (BMI 30.0-34.9)    Symptomatic anemia 03/24/2012   Past Surgical History:  Procedure Laterality Date   BIOPSY   01/15/2023   Procedure: BIOPSY;  Surgeon: Imogene Burn, MD;  Location: [facility] ENDOSCOPY;  Service: Gastroenterology;;   CHOLECYSTECTOMY     COLONOSCOPY WITH PROPOFOL N/A 01/15/2023   Procedure: COLONOSCOPY WITH PROPOFOL;  Surgeon: Imogene Burn, MD;  Location: [facility] ENDOSCOPY;  Service: Gastroenterology;  Laterality: N/A;   ESOPHAGOGASTRODUODENOSCOPY (EGD) WITH PROPOFOL N/A 01/15/2023   Procedure: ESOPHAGOGASTRODUODENOSCOPY (EGD) WITH PROPOFOL;  Surgeon: Imogene Burn, MD;  Location: [facility] ENDOSCOPY;  Service: Gastroenterology;  Laterality: N/A;   HERNIA REPAIR     POLYPECTOMY  01/15/2023   Procedure: POLYPECTOMY;  Surgeon: Imogene Burn, MD;  Location: [facility] ENDOSCOPY;  Service: Gastroenterology;;     A IV Location/Drains/Wounds Patient Lines/Drains/Airways Status     Active Line/Drains/Airways     Name Placement date Placement time Site Days   Peripheral IV 02/17/23 20 G Posterior;Right Forearm 02/17/23  1235  Forearm  less than 1   Wound 04/06/12 Other (Comment) Thigh Erythematous  04/06/12  0828  Thigh  [id number]            Intake/Output Last 24 hours No intake or output data in the 24 hours ending 02/17/23 1752  Labs/Imaging Results for orders placed or performed during the hospital encounter of 02/17/23 (from the past 48 hour(s))  Basic metabolic panel     Status: Abnormal   Collection Time: 02/17/23 12:14 PM  Result Value Ref Range   Sodium 144 135 - 145 mmol/L   Potassium 4.1 3.5 -  5.1 mmol/L   Chloride 109 98 - 111 mmol/L   CO2 23 22 - 32 mmol/L   Glucose, Bld 122 (H) 70 - 99 mg/dL    Comment: Glucose reference range applies only to samples taken after fasting for at least 8 hours.   BUN 19 8 - 23 mg/dL   Creatinine, Ser 5.78 0.44 - 1.00 mg/dL   Calcium 9.3 8.9 - 46.9 mg/dL   GFR, Estimated >62 >95 mL/min    Comment: (NOTE) Calculated using the CKD-EPI Creatinine Equation (2021)    Anion gap 12 5 - 15    Comment: Performed at [facility], [address]  [place], [place] [address]  CBC     Status: Abnormal   Collection Time: 02/17/23 12:14 PM  Result Value Ref Range   WBC 6.2 4.0 - 10.5 K/uL   RBC 3.62 (L) 3.87 - 5.11 MIL/uL   Hemoglobin 7.8 (L) 12.0 - 15.0 g/dL   HCT 24.4 (L) 01.0 - 27.2 %   MCV 80.4 80.0 - 100.0 fL   MCH 21.5 (L) 26.0 - 34.0 pg   MCHC 26.8 (L) 30.0 - 36.0 g/dL   RDW 53.6 (H) 64.4 - 03.4 %   Platelets 260 150 - 400 K/uL   nRBC 0.0 0.0 - 0.2 %    Comment: Performed at [facility], [address] [place], [place] [address]  Hepatic function panel     Status: None   Collection Time: 02/17/23 12:14 PM  Result Value Ref Range   Total Protein 7.3 6.5 - 8.1 g/dL   Albumin 3.5 3.5 - 5.0 g/dL   AST 20 15 - 41 U/L   ALT 15 0 - 44 U/L   Alkaline Phosphatase 104 38 - 126 U/L   Total Bilirubin 0.6 0.3 - 1.2 mg/dL   Bilirubin, Direct 0.2 0.0 - 0.2 mg/dL   Indirect Bilirubin 0.4 0.3 - 0.9 mg/dL    Comment: Performed at [facility], [address], [place], [place] [address]  Troponin I (High Sensitivity)     Status: None   Collection Time: 02/17/23 12:14 PM  Result Value Ref Range   Troponin I (High Sensitivity) 8 <18 ng/L    Comment: (NOTE) Elevated high sensitivity troponin I (hsTnI) values and significant  changes across serial measurements may suggest ACS but many other  chronic and acute conditions are known to elevate hsTnI results.  Refer to the "Links" section for chest pain algorithms and additional  guidance. Performed at [facility], [address], [place], [place] [address]   Resp panel by RT-PCR (RSV, Flu A&B, Covid) Anterior Nasal Swab     Status: None   Collection Time: 02/17/23  1:42 PM   Specimen: Anterior Nasal Swab  Result Value Ref Range   SARS Coronavirus 2 by RT PCR NEGATIVE NEGATIVE   Influenza A by PCR NEGATIVE NEGATIVE   Influenza B by PCR NEGATIVE NEGATIVE    Comment: (NOTE) The Xpert Xpress SARS-CoV-2/FLU/RSV plus assay is intended as an aid in the diagnosis of influenza  from Nasopharyngeal swab specimens and should not be used as a sole basis for treatment. Nasal washings and aspirates are unacceptable for Xpert Xpress SARS-CoV-2/FLU/RSV testing.  Fact Sheet for Patients: BloggerCourse.com  Fact Sheet for Healthcare Providers: SeriousBroker.it  This test is not yet approved or cleared by the [place] FDA and has been authorized for detection and/or diagnosis of SARS-CoV-2 by FDA under an Emergency Use Authorization (  EUA). This EUA will remain in effect (meaning this test can be used) for the duration of the COVID-19 declaration under Section 564(b)(1) of the Act, 21 U.S.C. section 360bbb-3(b)(1), unless the authorization is terminated or revoked.     Resp Syncytial Virus by PCR NEGATIVE NEGATIVE    Comment: (NOTE) Fact Sheet for Patients: BloggerCourse.com  Fact Sheet for Healthcare Providers: SeriousBroker.it  This test is not yet approved or cleared by the [place] FDA and has been authorized for detection and/or diagnosis of SARS-CoV-2 by FDA under an Emergency Use Authorization (EUA). This EUA will remain in effect (meaning this test can be used) for the duration of the COVID-19 declaration under Section 564(b)(1) of the Act, 21 U.S.C. section 360bbb-3(b)(1), unless the authorization is terminated or revoked.  Performed at [facility], [address], [place], [place] [address]   Brain natriuretic peptide     Status: Abnormal   Collection Time: 02/17/23  2:22 PM  Result Value Ref Range   B Natriuretic Peptide 168.7 (H) 0.0 - 100.0 pg/mL    Comment: Performed at [facility], [address], [place], [place] [address]  Troponin I (High Sensitivity)     Status: None   Collection Time: 02/17/23  2:22 PM  Result Value Ref Range   Troponin I (High Sensitivity) 8 <18 ng/L    Comment: (NOTE) Elevated high sensitivity  troponin I (hsTnI) values and significant  changes across serial measurements may suggest ACS but many other  chronic and acute conditions are known to elevate hsTnI results.  Refer to the "Links" section for chest pain algorithms and additional  guidance. Performed at [facility], [address], [place], [place] [address]    DG Chest Portable 1 View  Result Date: 02/17/2023 CLINICAL DATA:  Shortness of breath. EXAM: PORTABLE CHEST 1 VIEW COMPARISON:  01/12/2023. FINDINGS: There are increased interstitial markings with bilateral hilar and bibasilar predominance. There are probable superimposed opacities in the bilateral lower lobes, which may represent atelectasis and/or pneumonia. There is blunting of bilateral lateral costophrenic angles, favoring bilateral small pleural effusions. No pneumothorax. Stable moderately enlarged cardio-mediastinal silhouette. No acute osseous abnormalities. The soft tissues are within normal limits. IMPRESSION: *Findings favor congestive heart failure/pulmonary edema. Correlate clinically to exclude superimposed pneumonia in the bilateral lower lobes. Electronically Signed   By: Jules Schick M.D.   On: 02/17/2023 13:13    Pending Labs Unresulted Labs (From admission, onward)    None       Vitals/Pain Today's Vitals   02/17/23 1051 02/17/23 1215 02/17/23 1522 02/17/23 1523  BP:  (!) 154/97 133/80   Pulse:  98 (!) 109   Resp:  19 18   Temp:    97.9 F (36.6 C)  TempSrc:    Oral  SpO2:  97% 94%   Weight: 95.3 kg     Height: 5\' 6"  (1.676 m)     PainSc: 0-No pain       Isolation Precautions No active isolations  Medications Medications  furosemide (LASIX) injection 60 mg (60 mg Intravenous Given 02/17/23 1522)    Mobility walks     Focused Assessments Cardiac Assessment Handoff:    Lab Results  Component Value Date   TROPONINI <0.30 03/24/2012   No results found for: "DDIMER" Does the Patient currently have chest pain? No     R Recommendations: See Admitting Provider Note  Report given to:   Additional Notes:

## 2023-02-17 NOTE — Assessment & Plan Note (Signed)
Noted to have peptic ulcers on EGD last month. Placed on bid protonix. Was not started on po iron. Will start po iron at discharge as not to cause black stools during hospitalization.  02-18-2023 At discharge, start oral iron. Continue with Bid protonix.

## 2023-02-18 DIAGNOSIS — Z882 Allergy status to sulfonamides status: Secondary | ICD-10-CM | POA: Diagnosis not present

## 2023-02-18 DIAGNOSIS — Z7901 Long term (current) use of anticoagulants: Secondary | ICD-10-CM | POA: Diagnosis not present

## 2023-02-18 DIAGNOSIS — M7989 Other specified soft tissue disorders: Secondary | ICD-10-CM | POA: Diagnosis present

## 2023-02-18 DIAGNOSIS — Z1152 Encounter for screening for COVID-19: Secondary | ICD-10-CM | POA: Diagnosis not present

## 2023-02-18 DIAGNOSIS — I110 Hypertensive heart disease with heart failure: Secondary | ICD-10-CM | POA: Diagnosis present

## 2023-02-18 DIAGNOSIS — Z881 Allergy status to other antibiotic agents status: Secondary | ICD-10-CM | POA: Diagnosis not present

## 2023-02-18 DIAGNOSIS — I4891 Unspecified atrial fibrillation: Secondary | ICD-10-CM | POA: Diagnosis not present

## 2023-02-18 DIAGNOSIS — I4821 Permanent atrial fibrillation: Secondary | ICD-10-CM | POA: Diagnosis present

## 2023-02-18 DIAGNOSIS — I5031 Acute diastolic (congestive) heart failure: Secondary | ICD-10-CM | POA: Diagnosis present

## 2023-02-18 DIAGNOSIS — I350 Nonrheumatic aortic (valve) stenosis: Secondary | ICD-10-CM | POA: Diagnosis present

## 2023-02-18 DIAGNOSIS — Z23 Encounter for immunization: Secondary | ICD-10-CM | POA: Diagnosis present

## 2023-02-18 DIAGNOSIS — Z885 Allergy status to narcotic agent status: Secondary | ICD-10-CM | POA: Diagnosis not present

## 2023-02-18 DIAGNOSIS — Z8249 Family history of ischemic heart disease and other diseases of the circulatory system: Secondary | ICD-10-CM | POA: Diagnosis not present

## 2023-02-18 DIAGNOSIS — Z8711 Personal history of peptic ulcer disease: Secondary | ICD-10-CM | POA: Diagnosis not present

## 2023-02-18 DIAGNOSIS — Z8673 Personal history of transient ischemic attack (TIA), and cerebral infarction without residual deficits: Secondary | ICD-10-CM | POA: Diagnosis not present

## 2023-02-18 DIAGNOSIS — D509 Iron deficiency anemia, unspecified: Secondary | ICD-10-CM | POA: Diagnosis present

## 2023-02-18 DIAGNOSIS — J810 Acute pulmonary edema: Secondary | ICD-10-CM | POA: Diagnosis present

## 2023-02-18 DIAGNOSIS — E66813 Obesity, class 3: Secondary | ICD-10-CM | POA: Diagnosis present

## 2023-02-18 DIAGNOSIS — Z7982 Long term (current) use of aspirin: Secondary | ICD-10-CM | POA: Diagnosis not present

## 2023-02-18 DIAGNOSIS — Z880 Allergy status to penicillin: Secondary | ICD-10-CM | POA: Diagnosis not present

## 2023-02-18 DIAGNOSIS — Z6841 Body Mass Index (BMI) 40.0 and over, adult: Secondary | ICD-10-CM | POA: Diagnosis not present

## 2023-02-18 DIAGNOSIS — J9601 Acute respiratory failure with hypoxia: Secondary | ICD-10-CM | POA: Diagnosis present

## 2023-02-18 LAB — COMPREHENSIVE METABOLIC PANEL
ALT: 14 U/L (ref 0–44)
AST: 25 U/L (ref 15–41)
Albumin: 3.2 g/dL — ABNORMAL LOW (ref 3.5–5.0)
Alkaline Phosphatase: 93 U/L (ref 38–126)
Anion gap: 9 (ref 5–15)
BUN: 19 mg/dL (ref 8–23)
CO2: 27 mmol/L (ref 22–32)
Calcium: 9 mg/dL (ref 8.9–10.3)
Chloride: 104 mmol/L (ref 98–111)
Creatinine, Ser: 1.11 mg/dL — ABNORMAL HIGH (ref 0.44–1.00)
GFR, Estimated: 54 mL/min — ABNORMAL LOW (ref >60)
Glucose, Bld: 118 mg/dL — ABNORMAL HIGH (ref 70–99)
Potassium: 3.5 mmol/L (ref 3.5–5.1)
Sodium: 140 mmol/L (ref 135–145)
Total Bilirubin: 0.8 mg/dL (ref 0.3–1.2)
Total Protein: 6.8 g/dL (ref 6.5–8.1)

## 2023-02-18 LAB — MAGNESIUM: Magnesium: 2 mg/dL (ref 1.7–2.4)

## 2023-02-18 MED ORDER — POTASSIUM CHLORIDE CRYS ER 20 MEQ PO TBCR
40.0000 meq | EXTENDED_RELEASE_TABLET | Freq: Two times a day (BID) | ORAL | Status: DC
Start: 2023-02-18 — End: 2023-02-19
  Administered 2023-02-18 – 2023-02-19 (×3): 40 meq via ORAL
  Filled 2023-02-18 (×3): qty 2

## 2023-02-18 MED ORDER — FUROSEMIDE 10 MG/ML IJ SOLN: 20.0000 mg | Freq: Two times a day (BID) | INTRAMUSCULAR | Status: DC

## 2023-02-18 MED ORDER — FUROSEMIDE 10 MG/ML IJ SOLN
20.0000 mg | Freq: Two times a day (BID) | INTRAMUSCULAR | Status: DC
  Administered 2023-02-18 – 2023-02-19 (×3): 20 mg via INTRAVENOUS
  Filled 2023-02-18 (×3): qty 2

## 2023-02-18 MED ORDER — DILTIAZEM HCL-DEXTROSE 125-5 MG/125ML-% IV SOLN (PREMIX)
5.0000 mg/h | INTRAVENOUS | Status: DC
  Administered 2023-02-19: 5 mg/h via INTRAVENOUS
  Filled 2023-02-18 (×2): qty 125

## 2023-02-18 MED ORDER — DILTIAZEM LOAD VIA INFUSION
10.0000 mg | Freq: Once | INTRAVENOUS | Status: AC
  Administered 2023-02-18: 10 mg via INTRAVENOUS
  Filled 2023-02-18: qty 10

## 2023-02-18 NOTE — Assessment & Plan Note (Signed)
Bmi 44.5

## 2023-02-18 NOTE — Subjective & Objective (Signed)
Pt seen and examined. Breathing somewhat better. Noted to be in rapid afib this AM with Hrs in the 120-140s.  Urinated well last night. Able to walk to bathroom. SOB has improved. Still with pretibial, ankle and pedal edema

## 2023-02-18 NOTE — Progress Notes (Signed)
PROGRESS NOTE    Megan Espinoza  FIE:[id number] DOB: April 27, 1955 DOA: 02/17/2023 PCP: Ellyn Hack, MD  Subjective: Pt seen and examined. Breathing somewhat better. Noted to be in rapid afib this AM with Hrs in the 120-140s.  Urinated well last night. Able to walk to bathroom. SOB has improved. Still with pretibial, ankle and pedal edema   Hospital Course: CC: SOB, LE edema HPI: 67 year old Caucasian female history of obesity BMI of 33.89, hypertension, history of persistent/chronic A-fib, recent stroke in September 2024 presents to the ER today with worsening shortness of breath for the last 3 to 4 days.  She has also noticed increasing lower extreme edema for about a week or 2.  She was discharged at the end of September after having an ischemic stroke.  Patient states about 2 to 3 days after she was discharged on Eliquis, she broke out in a whole body rash/hives.  She was seen in urgent care and prescribed some prednisone.  About a day later she was called by urgent care and told to stop her HCTZ.  She has been off HCTZ for about 3 weeks.  She has noticed increasing edema over the last 2 weeks.  Patient brought to the ER due to shortness of breath and lower extremity edema.  On arrival temp 98.8 heart rate 108 blood pressure 148/72 satting 83% on room air  White count 6.2, hemoglobin 7.8, platelets of 260  BNP 168  Sodium 144, potassium 4.1, chloride 109, bicarb 23, BUN of 19, creatinine 0.9  Chest x-ray on my read shows pulmonary  EKG shows A-fib with a heart rate of 110  Patient given 60 mg of IV Lasix.  She has had good response to Lasix.  [facility] hospitalist consulted.   Assessment and Plan: * Acute pulmonary edema (HCC) Admit to observation med/tele bed. Continue with IV lasix 40 mg bid. Likely caused by recent cessation of hydrochlorothiazide(which pt has been taking for years), moderate AS noted on echo, recent PRBC transfusion without diuresis and  persistent anemia. No hx of diastolic CHF.  02-18-2023. Scr rose to 1.11 from 0.9 yesterday. Pt did receive 60 mg IV lasix x 1 in ER. Will reduce IV lasix to 20 mg bid. Monitor Scr.  Rapid atrial fibrillation (HCC) 02-18-2023 noted to be in rapid afib. Echo last month showed LVEF of 60-65%. Start cardizem infusion to slow rate. Continue with Toprol-xl. Keep K >4 and Mg >2.0. on kcl and mg replacement.  Permanent atrial fibrillation (HCC) Continue with Toprol-XL for rate control. And xarelto for CVA prophylaxis.  Pt states that Eliquis caused severe hives.  Keep K >4 and Mg >2.0 during diuresis.  02-18-2023 in rapid afib this AM. Start cardizem gtts.  Moderate aortic stenosis - mean gradient 19.0 mmHg. peak gradient 33.2 mmHg Moderate AS noted on recent echo for her CVA workup. Does not appear to be a low flow state. Normal LVEF 65%.  02-18-2023 monitor for overdiuresis.  Acute respiratory failure with hypoxia (HCC) RA sats 83%. Placed on supplemental O2. Not on home O2. Caused by pulmonary edema.  02-18-2023 wean to RA as tolerated  History of ischemic stroke - 01-12-2023 Recent dx'd in September 2024. On DOAC. 02-18-2023 continue with xarelto.  Iron deficiency anemia Noted to have peptic ulcers on EGD last month. Placed on bid protonix. Was not started on po iron. Will start po iron at discharge as not to cause black stools during hospitalization.  02-18-2023 At discharge, start oral iron. Continue  with Bid protonix.  Obesity, Class III, BMI 40-49.9 (morbid obesity) (HCC) Bmi 44.5   DVT prophylaxis: SCDs Start: 02/17/23 1954 rivaroxaban (XARELTO) tablet 20 mg     Code Status: Full Code Family Communication: no family at bedside. Pt is decisional Disposition Plan: return home Reason for continuing need for hospitalization: on IV lasix, IV cardizem gtts  Objective: Vitals:   02/17/23 1947 02/18/23 0029 02/18/23 0410 02/18/23 0700  BP: (!) 147/82 120/80 (!) 143/71 112/79   Pulse: (!) 102 (!) 106 (!) 109   Resp:   16 19  Temp: 98.1 F (36.7 C) 98.2 F (36.8 C) 98.2 F (36.8 C) 98.1 F (36.7 C)  TempSrc: Oral Oral Oral Oral  SpO2: 96% 94% 97%   Weight: 125.7 kg  125.2 kg   Height: 5\' 6"  (1.676 m)       Intake/Output Summary (Last 24 hours) at 02/18/2023 0811 Last data filed at 02/18/2023 0700 Gross per 24 hour  Intake 250 ml  Output 500 ml  Net -250 ml   Filed Weights   02/17/23 1051 02/17/23 1947 02/18/23 0410  Weight: 95.3 kg 125.7 kg 125.2 kg    Examination:  Physical Exam Vitals and nursing note reviewed.  Constitutional:      General: She is not in acute distress.    Appearance: She is obese. She is not toxic-appearing.  HENT:     Head: Normocephalic and atraumatic.     Nose: Nose normal.  Eyes:     General: No scleral icterus. Cardiovascular:     Rate and Rhythm: Tachycardia present. Rhythm irregular.  Pulmonary:     Effort: Pulmonary effort is normal. No respiratory distress.  Abdominal:     General: Abdomen is protuberant. Bowel sounds are normal. There is no distension.     Palpations: Abdomen is soft.     Tenderness: There is no abdominal tenderness.  Musculoskeletal:     Right lower leg: Edema present.     Left lower leg: Edema present.     Comments: +1 pitting right pedal and ankle edema +2 pitting left pedal and ankle edema +1-2 pitting bilateral pretibial edema  Skin:    General: Skin is warm and dry.     Capillary Refill: Capillary refill takes less than 2 seconds.  Neurological:     General: No focal deficit present.     Mental Status: She is alert and oriented to person, place, and time.     Data Reviewed: I have personally reviewed following labs and imaging studies  CBC: Recent Labs  Lab 02/17/23 1214  WBC 6.2  HGB 7.8*  HCT 29.1*  MCV 80.4  PLT 260   Basic Metabolic Panel: Recent Labs  Lab 02/17/23 1214 02/18/23 0634  NA 144 140  K 4.1 3.5  CL 109 104  CO2 23 27  GLUCOSE 122* 118*   BUN 19 19  CREATININE 0.91 1.11*  CALCIUM 9.3 9.0  MG  --  2.0   GFR: Estimated Creatinine Clearance: 66.5 mL/min (A) (by C-G formula based on SCr of 1.11 mg/dL (H)). Liver Function Tests: Recent Labs  Lab 02/17/23 1214 02/18/23 0634  AST 20 25  ALT 15 14  ALKPHOS 104 93  BILITOT 0.6 0.8  PROT 7.3 6.8  ALBUMIN 3.5 3.2*   BNP (last 3 results) Recent Labs    02/17/23 1422  BNP 168.7*   EKG: on my review/interpretation shows rapid afib   Recent Results (from the past 240 hour(s))  Resp panel by  RT-PCR (RSV, Flu A&B, Covid) Anterior Nasal Swab     Status: None   Collection Time: 02/17/23  1:42 PM   Specimen: Anterior Nasal Swab  Result Value Ref Range Status   SARS Coronavirus 2 by RT PCR NEGATIVE NEGATIVE Final   Influenza A by PCR NEGATIVE NEGATIVE Final   Influenza B by PCR NEGATIVE NEGATIVE Final    Comment: (NOTE) The Xpert Xpress SARS-CoV-2/FLU/RSV plus assay is intended as an aid in the diagnosis of influenza from Nasopharyngeal swab specimens and should not be used as a sole basis for treatment. Nasal washings and aspirates are unacceptable for Xpert Xpress SARS-CoV-2/FLU/RSV testing.  Fact Sheet for Patients: BloggerCourse.com  Fact Sheet for Healthcare Providers: SeriousBroker.it  This test is not yet approved or cleared by the [place] FDA and has been authorized for detection and/or diagnosis of SARS-CoV-2 by FDA under an Emergency Use Authorization (EUA). This EUA will remain in effect (meaning this test can be used) for the duration of the COVID-19 declaration under Section 564(b)(1) of the Act, 21 U.S.C. section 360bbb-3(b)(1), unless the authorization is terminated or revoked.     Resp Syncytial Virus by PCR NEGATIVE NEGATIVE Final    Comment: (NOTE) Fact Sheet for Patients: BloggerCourse.com  Fact Sheet for Healthcare  Providers: SeriousBroker.it  This test is not yet approved or cleared by the [place] FDA and has been authorized for detection and/or diagnosis of SARS-CoV-2 by FDA under an Emergency Use Authorization (EUA). This EUA will remain in effect (meaning this test can be used) for the duration of the COVID-19 declaration under Section 564(b)(1) of the Act, 21 U.S.C. section 360bbb-3(b)(1), unless the authorization is terminated or revoked.  Performed at [facility], [address], [place], [place] [address]      Radiology Studies: DG Chest Portable 1 View  Result Date: 02/17/2023 CLINICAL DATA:  Shortness of breath. EXAM: PORTABLE CHEST 1 VIEW COMPARISON:  01/12/2023. FINDINGS: There are increased interstitial markings with bilateral hilar and bibasilar predominance. There are probable superimposed opacities in the bilateral lower lobes, which may represent atelectasis and/or pneumonia. There is blunting of bilateral lateral costophrenic angles, favoring bilateral small pleural effusions. No pneumothorax. Stable moderately enlarged cardio-mediastinal silhouette. No acute osseous abnormalities. The soft tissues are within normal limits. IMPRESSION: *Findings favor congestive heart failure/pulmonary edema. Correlate clinically to exclude superimposed pneumonia in the bilateral lower lobes. Electronically Signed   By: Jules Schick M.D.   On: 02/17/2023 13:13    Scheduled Meds:  atorvastatin  40 mg Oral Daily   cyclobenzaprine  10 mg Oral QHS   diltiazem  10 mg Intravenous Once   furosemide  20 mg Intravenous BID   influenza vaccine adjuvanted  0.5 mL Intramuscular Tomorrow-1000   magnesium oxide  400 mg Oral BID   metoprolol succinate  25 mg Oral Daily   pantoprazole  40 mg Oral BID   Followed by   Melene Muller ON 04/28/2023] pantoprazole  40 mg Oral Daily   pneumococcal 20-valent conjugate vaccine  0.5 mL Intramuscular Tomorrow-1000   potassium chloride  40  mEq Oral BID   rivaroxaban  20 mg Oral Q supper   Continuous Infusions:  diltiazem (CARDIZEM) infusion       LOS: 0 days   Time spent: 45 minutes  Carollee Herter, DO  [facility]  02/18/2023, 8:11 AM

## 2023-02-18 NOTE — Plan of Care (Signed)
Patient ID: MCKENNZIE GORT, female   DOB: 1955/07/02, 67 y.o.   MRN: [id number]  Problem: Education: Goal: Knowledge of General Education information will improve Description: Including pain rating scale, medication(s)/side effects and non-pharmacologic comfort measures Outcome: Progressing   Problem: Health Behavior/Discharge Planning: Goal: Ability to manage health-related needs will improve Outcome: Progressing   Problem: Clinical Measurements: Goal: Ability to maintain clinical measurements within normal limits will improve Outcome: Progressing Goal: Will remain free from infection Outcome: Progressing Goal: Diagnostic test results will improve Outcome: Progressing Goal: Respiratory complications will improve Outcome: Progressing Goal: Cardiovascular complication will be avoided Outcome: Progressing   Problem: Activity: Goal: Risk for activity intolerance will decrease Outcome: Progressing   Problem: Nutrition: Goal: Adequate nutrition will be maintained Outcome: Progressing   Problem: Coping: Goal: Level of anxiety will decrease Outcome: Progressing   Problem: Elimination: Goal: Will not experience complications related to bowel motility Outcome: Progressing Goal: Will not experience complications related to urinary retention Outcome: Progressing   Problem: Pain Management: Goal: General experience of comfort will improve Outcome: Progressing   Problem: Safety: Goal: Ability to remain free from injury will improve Outcome: Progressing   Problem: Skin Integrity: Goal: Risk for impaired skin integrity will decrease Outcome: Progressing    Lidia Collum, RN

## 2023-02-18 NOTE — Assessment & Plan Note (Addendum)
02-18-2023 noted to be in rapid afib. Echo last month showed LVEF of 60-65%. Start cardizem infusion to slow rate. Continue with Toprol-xl. Keep K >4 and Mg >2.0. on kcl and mg replacement.

## 2023-02-19 ENCOUNTER — Inpatient Hospital Stay (HOSPITAL_COMMUNITY): Payer: Medicare HMO

## 2023-02-19 DIAGNOSIS — J810 Acute pulmonary edema: Secondary | ICD-10-CM | POA: Diagnosis not present

## 2023-02-19 LAB — BASIC METABOLIC PANEL
Anion gap: 8 (ref 5–15)
BUN: 20 mg/dL (ref 8–23)
CO2: 29 mmol/L (ref 22–32)
Calcium: 9.1 mg/dL (ref 8.9–10.3)
Chloride: 103 mmol/L (ref 98–111)
Creatinine, Ser: 1.07 mg/dL — ABNORMAL HIGH (ref 0.44–1.00)
GFR, Estimated: 57 mL/min — ABNORMAL LOW (ref >60)
Glucose, Bld: 143 mg/dL — ABNORMAL HIGH (ref 70–99)
Potassium: 4.3 mmol/L (ref 3.5–5.1)
Sodium: 140 mmol/L (ref 135–145)

## 2023-02-19 LAB — CBC
HCT: 27.6 % — ABNORMAL LOW (ref 36.0–46.0)
Hemoglobin: 7.6 g/dL — ABNORMAL LOW (ref 12.0–15.0)
MCH: 21.4 pg — ABNORMAL LOW (ref 26.0–34.0)
MCHC: 27.5 g/dL — ABNORMAL LOW (ref 30.0–36.0)
MCV: 77.7 fL — ABNORMAL LOW (ref 80.0–100.0)
Platelets: 299 10*3/uL (ref 150–400)
RBC: 3.55 MIL/uL — ABNORMAL LOW (ref 3.87–5.11)
RDW: 17 % — ABNORMAL HIGH (ref 11.5–15.5)
WBC: 8 10*3/uL (ref 4.0–10.5)
nRBC: 0 % (ref 0.0–0.2)

## 2023-02-19 MED ORDER — METOPROLOL SUCCINATE ER 50 MG PO TB24: 50.0000 mg | ORAL_TABLET | Freq: Every day | ORAL | Status: DC

## 2023-02-19 MED ORDER — METOPROLOL SUCCINATE ER 25 MG PO TB24
25.0000 mg | ORAL_TABLET | Freq: Once | ORAL | Status: AC
  Administered 2023-02-19: 25 mg via ORAL
  Filled 2023-02-19: qty 1

## 2023-02-19 MED ORDER — FUROSEMIDE 10 MG/ML IJ SOLN: 40.0000 mg | Freq: Two times a day (BID) | INTRAMUSCULAR | Status: DC

## 2023-02-19 MED ORDER — FUROSEMIDE 40 MG PO TABS: 40.0000 mg | ORAL_TABLET | Freq: Every day | ORAL | 1 refills | Status: DC

## 2023-02-19 MED ORDER — FERROUS SULFATE 325 (65 FE) MG PO TBEC: 325.0000 mg | DELAYED_RELEASE_TABLET | ORAL | 0 refills | Status: DC

## 2023-02-19 MED ORDER — METOPROLOL SUCCINATE ER 50 MG PO TB24: 50.0000 mg | ORAL_TABLET | Freq: Every day | ORAL | 2 refills | Status: DC

## 2023-02-19 MED ORDER — POTASSIUM CHLORIDE CRYS ER 20 MEQ PO TBCR: 20.0000 meq | EXTENDED_RELEASE_TABLET | Freq: Every day | ORAL | 0 refills | Status: DC

## 2023-02-19 NOTE — Discharge Summary (Signed)
Physician Discharge Summary  Megan Espinoza:[id number] DOB: [birth year] DOA: 02/17/2023  PCP: Megan Hack, MD  Admit date: 02/17/2023 Discharge date: 02/19/2023  Time spent: 40 minutes  Recommendations for Outpatient Follow-up:  Follow outpatient CBC/CMP  Follow with cardiology outpatient (aortic stenosis/heart failure) Discharged on lasix 40 mg daily, may need dose adjustment - follow volume status/lytes/creatinine outpatient  Needs repeat CXR outpatient  Follow iron def outpatient   Discharge Diagnoses:  Principal Problem:   Acute pulmonary edema (HCC) Active Problems:   Rapid atrial fibrillation (HCC)   Acute respiratory failure with hypoxia (HCC)   Moderate aortic stenosis - mean gradient 19.0 mmHg. peak gradient 33.2 mmHg   Permanent atrial fibrillation (HCC)   Obesity, Class III, BMI 40-49.9 (morbid obesity) (HCC)   Iron deficiency anemia   History of ischemic stroke - 01-12-2023   Discharge Condition: stable  Diet recommendation: heart healthy  Filed Weights   02/17/23 1947 02/18/23 0410 02/19/23 0500  Weight: 125.7 kg 125.2 kg 124 kg    History of present illness:   67 year old Caucasian female history of obesity BMI of 33.89, hypertension, history of persistent/chronic Megan Espinoza, recent stroke in September 2024 presents to the ER today with worsening shortness of breath for the last 3 to 4 days.  She has also noticed increasing lower extreme edema for about Megan Espinoza week or 2.  She was diagnosed with acute pulmonary edema and started on IV lasix.    Hospitalization c/b RVR, now back in sinus.   Discharging on 40 mg lasix daily.  Metoprolol increased to 50 mg.  See below for additional details.  Hospital Course:  Assessment and Plan:  Acute pulmonary edema  Heart Failure Exacerbation Diastolic Heart Failure Moderate Aortic Stenosis Recent echo showed preserved EF CXR today with improving appearance of pulmonary edema Still with significant LE edema  today I'd initially discussed her staying another night with plan to continue on 40 mg IV lasix BID given significant LE edema.  She initially reluctantly agreed, but Megan Espinoza few minutes after our conversation, she called RN to bedside and expressed dissatisfaction with that plan.  I called her back and she requested discharge.  Noted importance of outpatient follow up, daily weights, repeat labs.  Will discharge with 40 mg lasix PO daily.  Strict I/O, daily weights   Permanent atrial fibrillation with RVR Increase metoprolol to 50 mg daily (stop diltiazem gtt).  Now in sinus. And xarelto for CVA prophylaxis.  Pt states that Eliquis caused severe hives.   Acute respiratory failure with hypoxia (HCC) Currently on RA CXR 11/1 with improving pulm edema   History of ischemic stroke - 01-12-2023 Recent dx'd in September 2024. On DOAC.   Iron deficiency anemia Noted to have peptic ulcers on EGD last month. Placed on bid protonix. Was not started on po iron. Will start po iron at discharge   Obesity, Class III, BMI 40-49.9 (morbid obesity) (HCC) Bmi 44.5    Procedures: Echo IMPRESSIONS     1. Left ventricular ejection fraction, by estimation, is 60 to 65%. The  left ventricle has normal function. The left ventricle has no regional  wall motion abnormalities. Left ventricular diastolic parameters were  normal.   2. Right ventricular systolic function is normal. The right ventricular  size is normal.   3. Left atrial size was moderately dilated.   4. The mitral valve is degenerative. Mild mitral valve regurgitation. No  evidence of mitral stenosis. Moderate mitral annular calcification.   5. The aortic  valve is normal in structure. Aortic valve regurgitation is  not visualized. Moderate aortic valve stenosis. Aortic valve area, by VTI  measures 1.29 cm. Aortic valve mean gradient measures 19.0 mmHg. Aortic  valve Vmax measures 2.88 m/s.   6. The inferior vena cava is dilated in size with  >50% respiratory  variability, suggesting right atrial pressure of 8 mmHg.   7. Agitated saline contrast bubble study was negative, with no evidence  of any interatrial shunt.    Consultations: none  Discharge Exam: Vitals:   02/19/23 1303 02/19/23 1347  BP: (!) 129/59   Pulse: 98 87  Resp: 16   Temp: 98.1 F (36.7 C)   SpO2: 95%    See progress note We'd discussed an additional day of diuresis, but after I finished speaking with her and plan initially to stay, RN called and related that she'd called the RN upset with plan to stay.  Will discharge on lasix and increased metoprolol dose, discussed over phone.  General: No acute distress. Cardiovascular: RRR, systolic murmur Lungs: unlabored Neurological: Alert and oriented 3. Moves all extremities 4 with equal strength. Cranial nerves II through XII grossly intact. Extremities: LE edema  Discharge Instructions   Discharge Instructions     (HEART FAILURE PATIENTS) Call MD:  Anytime you have any of the following symptoms: 1) 3 pound weight gain in 24 hours or 5 pounds in 1 week 2) shortness of breath, with or without Megan Espinoza dry hacking cough 3) swelling in the hands, feet or stomach 4) if you have to sleep on extra pillows at night in order to breathe.   Complete by: As directed    Call MD for:  difficulty breathing, headache or visual disturbances   Complete by: As directed    Call MD for:  extreme fatigue   Complete by: As directed    Call MD for:  hives   Complete by: As directed    Call MD for:  persistant dizziness or light-headedness   Complete by: As directed    Call MD for:  persistant nausea and vomiting   Complete by: As directed    Call MD for:  redness, tenderness, or signs of infection (pain, swelling, redness, odor or green/yellow discharge around incision site)   Complete by: As directed    Call MD for:  severe uncontrolled pain   Complete by: As directed    Call MD for:  temperature >100.4   Complete by: As  directed    Diet - low sodium heart healthy   Complete by: As directed    Discharge instructions   Complete by: As directed    You were seen for volume overload and pulmonary edema (fluid on your lungs).  This has improved with diuresis (lasix).  We'll send you home on 40 mg of lasix to take once daily.  Take 20 meq of potassium with this.  You need repeat labs within 1 week to ensure your kidney function and electrolytes are stable and to make sure we're making continued improvements with keeping the extra fluid off you.  Your chest x ray showed improving fluid, but there's still some present and you have swelling in your legs as well.  I'd prefer you stay for additional diuresis, but after Capucine Tryon discussion, we'll discharge you home.  It's important you arrange follow up as discussed.   Weigh yourself every day.  If you gain 2-3 lbs in Johntavious Francom day or 5 lbs in Gurveer Colucci week, take an extra dose  of the lasix and call your PCP or cardiologist about further instructions.    You had fast heart rate related to your atrial fibrillation.  We increased your metoprolol to 50 mg daily.  Continue this outpatient.  You'll need to follow up with cardiology as planned for your aortic stenosis.    Return for new, recurrent, or worsening symptoms.  Please ask your PCP to request records from this hospitalization so they know what was done and what the next steps will be.   Increase activity slowly   Complete by: As directed       Allergies as of 02/19/2023       Reactions   Eliquis [apixaban] Anaphylaxis   Penicillins Swelling   Codeine Nausea And Vomiting   Erythromycin    Mouth thrush- per pt   Sulfa Antibiotics    Mouth thrush- per pt        Medication List     TAKE these medications    acetaminophen 500 MG tablet Commonly known as: TYLENOL Take 1,000 mg by mouth daily.   amLODipine 10 MG tablet Commonly known as: NORVASC Take 10 mg by mouth daily.   atorvastatin 40 MG tablet Commonly known as:  LIPITOR Take 1 tablet (40 mg total) by mouth daily.   cyclobenzaprine 10 MG tablet Commonly known as: FLEXERIL Take 10 mg by mouth at bedtime.   furosemide 40 MG tablet Commonly known as: Lasix Take 1 tablet (40 mg total) by mouth daily.   metoprolol succinate 50 MG 24 hr tablet Commonly known as: TOPROL-XL Take 1 tablet (50 mg total) by mouth daily. Take with or immediately following Angalena Cousineau meal. Start taking on: February 20, 2023 What changed:  medication strength how much to take additional instructions   pantoprazole 40 MG tablet Commonly known as: PROTONIX Take 1 tablet (40 mg total) by mouth 2 (two) times daily for 10 weeks (12/6), THEN 1 tablet (40 mg) once daily. Start taking on: March 26, 2023 What changed: See the new instructions.   potassium chloride SA 20 MEQ tablet Commonly known as: KLOR-CON M Take 1 tablet (20 mEq total) by mouth daily. Check labs with your PCP within 1 week (kidney function and electrolytes)   rivaroxaban 20 MG Tabs tablet Commonly known as: XARELTO Take 20 mg by mouth daily with supper.       Allergies  Allergen Reactions   Eliquis [Apixaban] Anaphylaxis   Penicillins Swelling   Codeine Nausea And Vomiting   Erythromycin     Mouth thrush- per pt   Sulfa Antibiotics     Mouth thrush- per pt      The results of significant diagnostics from this hospitalization (including imaging, microbiology, ancillary and laboratory) are listed below for reference.    Significant Diagnostic Studies: DG CHEST PORT 1 VIEW  Result Date: 02/19/2023 CLINICAL DATA:  10026 Shortness of breath 10026 EXAM: PORTABLE CHEST 1 VIEW COMPARISON:  02/17/2023 FINDINGS: Cardiomegaly. Pulmonary vascular congestion. Improving appearance of bilateral pulmonary edema. Probable trace bilateral pleural effusions. No pneumothorax. IMPRESSION: Improving appearance of pulmonary edema. Electronically Signed   By: Duanne Guess D.O.   On: 02/19/2023 16:57   DG Chest  Portable 1 View  Result Date: 02/17/2023 CLINICAL DATA:  Shortness of breath. EXAM: PORTABLE CHEST 1 VIEW COMPARISON:  01/12/2023. FINDINGS: There are increased interstitial markings with bilateral hilar and bibasilar predominance. There are probable superimposed opacities in the bilateral lower lobes, which may represent atelectasis and/or pneumonia. There is blunting of bilateral lateral  costophrenic angles, favoring bilateral small pleural effusions. No pneumothorax. Stable moderately enlarged cardio-mediastinal silhouette. No acute osseous abnormalities. The soft tissues are within normal limits. IMPRESSION: *Findings favor congestive heart failure/pulmonary edema. Correlate clinically to exclude superimposed pneumonia in the bilateral lower lobes. Electronically Signed   By: Jules Schick M.D.   On: 02/17/2023 13:13    Microbiology: Recent Results (from the past 240 hour(s))  Resp panel by RT-PCR (RSV, Flu Jocob Dambach&B, Covid) Anterior Nasal Swab     Status: None   Collection Time: 02/17/23  1:42 PM   Specimen: Anterior Nasal Swab  Result Value Ref Range Status   SARS Coronavirus 2 by RT PCR NEGATIVE NEGATIVE Final   Influenza Alisi Lupien by PCR NEGATIVE NEGATIVE Final   Influenza B by PCR NEGATIVE NEGATIVE Final    Comment: (NOTE) The Xpert Xpress SARS-CoV-2/FLU/RSV plus assay is intended as an aid in the diagnosis of influenza from Nasopharyngeal swab specimens and should not be used as Valerian Jewel sole basis for treatment. Nasal washings and aspirates are unacceptable for Xpert Xpress SARS-CoV-2/FLU/RSV testing.  Fact Sheet for Patients: BloggerCourse.com  Fact Sheet for Healthcare Providers: SeriousBroker.it  This test is not yet approved or cleared by the [place] FDA and has been authorized for detection and/or diagnosis of SARS-CoV-2 by FDA under an Emergency Use Authorization (EUA). This EUA will remain in effect (meaning this test can be used) for  the duration of the COVID-19 declaration under Section 564(b)(1) of the Act, 21 U.S.C. section 360bbb-3(b)(1), unless the authorization is terminated or revoked.     Resp Syncytial Virus by PCR NEGATIVE NEGATIVE Final    Comment: (NOTE) Fact Sheet for Patients: BloggerCourse.com  Fact Sheet for Healthcare Providers: SeriousBroker.it  This test is not yet approved or cleared by the [place] FDA and has been authorized for detection and/or diagnosis of SARS-CoV-2 by FDA under an Emergency Use Authorization (EUA). This EUA will remain in effect (meaning this test can be used) for the duration of the COVID-19 declaration under Section 564(b)(1) of the Act, 21 U.S.C. section 360bbb-3(b)(1), unless the authorization is terminated or revoked.  Performed at [facility], [address], [place], [place] [address]      Labs: Basic Metabolic Panel: Recent Labs  Lab 02/17/23 1214 02/18/23 0634 02/19/23 1450  NA 144 140 140  K 4.1 3.5 4.3  CL 109 104 103  CO2 23 27 29   GLUCOSE 122* 118* 143*  BUN 19 19 20   CREATININE 0.91 1.11* 1.07*  CALCIUM 9.3 9.0 9.1  MG  --  2.0  --    Liver Function Tests: Recent Labs  Lab 02/17/23 1214 02/18/23 0634  AST 20 25  ALT 15 14  ALKPHOS 104 93  BILITOT 0.6 0.8  PROT 7.3 6.8  ALBUMIN 3.5 3.2*   No results for input(s): "LIPASE", "AMYLASE" in the last 168 hours. No results for input(s): "AMMONIA" in the last 168 hours. CBC: Recent Labs  Lab 02/17/23 1214 02/19/23 1450  WBC 6.2 8.0  HGB 7.8* 7.6*  HCT 29.1* 27.6*  MCV 80.4 77.7*  PLT 260 299   Cardiac Enzymes: No results for input(s): "CKTOTAL", "CKMB", "CKMBINDEX", "TROPONINI" in the last 168 hours. BNP: BNP (last 3 results) Recent Labs    02/17/23 1422  BNP 168.7*    ProBNP (last 3 results) No results for input(s): "PROBNP" in the last 8760 hours.  CBG: No results for input(s): "GLUCAP" in the last 168  hours.     Signed:  Lacretia Nicks MD.  [facility] 02/19/2023, 5:40 PM

## 2023-02-19 NOTE — Progress Notes (Signed)
Patient ambulating in her room on room air. She felt that her breathing was back to baseline. Oxygen saturations 90-91% while ambulating and 95% while at rest. RN notified Dr. Lowell Guitar of the above.

## 2023-02-19 NOTE — Progress Notes (Signed)
PROGRESS NOTE    Megan Espinoza  ZOX:[id number] DOB: 09-28-1955 DOA: 02/17/2023 PCP: Ellyn Hack, MD  Chief Complaint  Patient presents with   Shortness of Breath   Leg Swelling    Brief Narrative:   67 year old Caucasian female history of obesity BMI of 33.89, hypertension, history of persistent/chronic Megan Espinoza, recent stroke in September 2024 presents to the ER today with worsening shortness of breath for the last 3 to 4 days.  She has also noticed increasing lower extreme edema for about Megan Espinoza week or 2.  She was diagnosed with acute pulmonary edema and started on IV lasix.     Assessment & Plan:   Principal Problem:   Acute pulmonary edema (HCC) Active Problems:   Rapid atrial fibrillation (HCC)   Acute respiratory failure with hypoxia (HCC)   Moderate aortic stenosis - mean gradient 19.0 mmHg. peak gradient 33.2 mmHg   Permanent atrial fibrillation (HCC)   Obesity, Class III, BMI 40-49.9 (morbid obesity) (HCC)   Iron deficiency anemia   History of ischemic stroke - 01-12-2023  Acute pulmonary edema  Heart Failure Exacerbation Diastolic Heart Failure Moderate Aortic Stenosis Recent echo showed preserved EF CXR today with improving appearance of pulmonary edema Still with significant LE edema today, will continue lasix 40 mg BID Strict I/O, daily weights  Permanent atrial fibrillation with RVR Increase metoprolol to 50 mg daily (stop diltiazem gtt).  Now in sinus. And xarelto for CVA prophylaxis.  Pt states that Eliquis caused severe hives.   Acute respiratory failure with hypoxia (HCC) Currently on RA CXR 11/1 with improving pulm edema   History of ischemic stroke - 01-12-2023 Recent dx'd in September 2024. On DOAC.   Iron deficiency anemia Noted to have peptic ulcers on EGD last month. Placed on bid protonix. Was not started on po iron. Will start po iron at discharge  Obesity, Class III, BMI 40-49.9 (morbid obesity) (HCC) Bmi 44.5    DVT prophylaxis:  xarelto Code Status: full Family Communication: husband Disposition:   Status is: Inpatient Remains inpatient appropriate because: continued need for inpatient care   Consultants:  none  Procedures:  none  Antimicrobials:  Anti-infectives (From admission, onward)    None       Subjective: Feels better  Objective: Vitals:   02/19/23 0838 02/19/23 0900 02/19/23 1303 02/19/23 1347  BP: 136/60  (!) 129/59   Pulse: 95  98 87  Resp:   16   Temp:   98.1 F (36.7 C)   TempSrc:   Oral   SpO2: 100% 94% 95%   Weight:      Height:        Intake/Output Summary (Last 24 hours) at 02/19/2023 1715 Last data filed at 02/19/2023 1100 Gross per 24 hour  Intake 860 ml  Output 2125 ml  Net -1265 ml   Filed Weights   02/17/23 1947 02/18/23 0410 02/19/23 0500  Weight: 125.7 kg 125.2 kg 124 kg    Examination:  General exam: Appears calm and comfortable  Respiratory system: distant, unlabored on RA Cardiovascular system: RRR, sinus on tele Gastrointestinal system: Abdomen is nondistended, soft and nontender.  Central nervous system: Alert and oriented. No focal neurological deficits. Extremities: bilateral LE edema, 2+  Data Reviewed: I have personally reviewed following labs and imaging studies  CBC: Recent Labs  Lab 02/17/23 1214 02/19/23 1450  WBC 6.2 8.0  HGB 7.8* 7.6*  HCT 29.1* 27.6*  MCV 80.4 77.7*  PLT 260 299    Basic  Metabolic Panel: Recent Labs  Lab 02/17/23 1214 02/18/23 0634 02/19/23 1450  NA 144 140 140  K 4.1 3.5 4.3  CL 109 104 103  CO2 23 27 29   GLUCOSE 122* 118* 143*  BUN 19 19 20   CREATININE 0.91 1.11* 1.07*  CALCIUM 9.3 9.0 9.1  MG  --  2.0  --     GFR: Estimated Creatinine Clearance: 68.6 mL/min (Trust Leh) (by C-G formula based on SCr of 1.07 mg/dL (H)).  Liver Function Tests: Recent Labs  Lab 02/17/23 1214 02/18/23 0634  AST 20 25  ALT 15 14  ALKPHOS 104 93  BILITOT 0.6 0.8  PROT 7.3 6.8  ALBUMIN 3.5 3.2*    CBG: No  results for input(s): "GLUCAP" in the last 168 hours.   Recent Results (from the past 240 hour(s))  Resp panel by RT-PCR (RSV, Flu Nashae Maudlin&B, Covid) Anterior Nasal Swab     Status: None   Collection Time: 02/17/23  1:42 PM   Specimen: Anterior Nasal Swab  Result Value Ref Range Status   SARS Coronavirus 2 by RT PCR NEGATIVE NEGATIVE Final   Influenza Emmalena Canny by PCR NEGATIVE NEGATIVE Final   Influenza B by PCR NEGATIVE NEGATIVE Final    Comment: (NOTE) The Xpert Xpress SARS-CoV-2/FLU/RSV plus assay is intended as an aid in the diagnosis of influenza from Nasopharyngeal swab specimens and should not be used as Marena Witts sole basis for treatment. Nasal washings and aspirates are unacceptable for Xpert Xpress SARS-CoV-2/FLU/RSV testing.  Fact Sheet for Patients: BloggerCourse.com  Fact Sheet for Healthcare Providers: SeriousBroker.it  This test is not yet approved or cleared by the [place] FDA and has been authorized for detection and/or diagnosis of SARS-CoV-2 by FDA under an Emergency Use Authorization (EUA). This EUA will remain in effect (meaning this test can be used) for the duration of the COVID-19 declaration under Section 564(b)(1) of the Act, 21 U.S.C. section 360bbb-3(b)(1), unless the authorization is terminated or revoked.     Resp Syncytial Virus by PCR NEGATIVE NEGATIVE Final    Comment: (NOTE) Fact Sheet for Patients: BloggerCourse.com  Fact Sheet for Healthcare Providers: SeriousBroker.it  This test is not yet approved or cleared by the [place] FDA and has been authorized for detection and/or diagnosis of SARS-CoV-2 by FDA under an Emergency Use Authorization (EUA). This EUA will remain in effect (meaning this test can be used) for the duration of the COVID-19 declaration under Section 564(b)(1) of the Act, 21 U.S.C. section 360bbb-3(b)(1), unless the authorization is  terminated or revoked.  Performed at [facility], [address], [place], [place] [address]          Radiology Studies: DG CHEST PORT 1 VIEW  Result Date: 02/19/2023 CLINICAL DATA:  10026 Shortness of breath 10026 EXAM: PORTABLE CHEST 1 VIEW COMPARISON:  02/17/2023 FINDINGS: Cardiomegaly. Pulmonary vascular congestion. Improving appearance of bilateral pulmonary edema. Probable trace bilateral pleural effusions. No pneumothorax. IMPRESSION: Improving appearance of pulmonary edema. Electronically Signed   By: Duanne Guess D.O.   On: 02/19/2023 16:57        Scheduled Meds:  atorvastatin  40 mg Oral Daily   cyclobenzaprine  10 mg Oral QHS   furosemide  40 mg Intravenous BID   influenza vaccine adjuvanted  0.5 mL Intramuscular Tomorrow-1000   magnesium oxide  400 mg Oral BID   [START ON 02/20/2023] metoprolol succinate  50 mg Oral Daily   pantoprazole  40 mg Oral BID   Followed by   Melene Muller  ON 04/28/2023] pantoprazole  40 mg Oral Daily   pneumococcal 20-valent conjugate vaccine  0.5 mL Intramuscular Tomorrow-1000   potassium chloride  40 mEq Oral BID   rivaroxaban  20 mg Oral Q supper   Continuous Infusions:   LOS: 1 day    Time spent: over 30 min    Lacretia Nicks, MD [facility]   To contact the attending provider between 7A-7P or the covering provider during after hours 7P-7A, please log into the web site [email or web address] and access using universal [place] password for that web site. If you do not have the password, please call the hospital operator.  02/19/2023, 5:15 PM

## 2023-02-22 ENCOUNTER — Encounter: Payer: Self-pay | Admitting: Cardiology

## 2023-02-22 ENCOUNTER — Ambulatory Visit: Payer: Medicare HMO | Attending: Cardiology | Admitting: Cardiology

## 2023-02-22 VITALS — BP 138/68 | HR 100 | Ht 66.0 in | Wt 276.0 lb

## 2023-02-22 DIAGNOSIS — I5032 Chronic diastolic (congestive) heart failure: Secondary | ICD-10-CM

## 2023-02-22 DIAGNOSIS — J810 Acute pulmonary edema: Secondary | ICD-10-CM

## 2023-02-22 DIAGNOSIS — I480 Paroxysmal atrial fibrillation: Secondary | ICD-10-CM

## 2023-02-22 DIAGNOSIS — R5381 Other malaise: Secondary | ICD-10-CM

## 2023-02-22 DIAGNOSIS — J9601 Acute respiratory failure with hypoxia: Secondary | ICD-10-CM

## 2023-02-22 DIAGNOSIS — D509 Iron deficiency anemia, unspecified: Secondary | ICD-10-CM | POA: Diagnosis not present

## 2023-02-22 DIAGNOSIS — I350 Nonrheumatic aortic (valve) stenosis: Secondary | ICD-10-CM

## 2023-02-22 NOTE — Patient Instructions (Signed)
Medication Instructions:   No change  *If you need a refill on your cardiac medications before your next appointment, please call your pharmacy*  Lab Work: None  Testing/Procedures: None    Follow-Up: At [facility], you and your health needs are our priority.  As part of our continuing mission to provide you with exceptional heart care, we have created designated Provider Care Teams.  These Care Teams include your primary Cardiologist (physician) and Advanced Practice Providers (APPs -  Physician Assistants and Nurse Practitioners) who all work together to provide you with the care you need, when you need it.   Your next appointment:   May 25 2023 at 9:20 am   Provider:   Dr. [place]  Other Instructions

## 2023-02-24 ENCOUNTER — Telehealth: Payer: Self-pay | Admitting: Cardiology

## 2023-02-24 MED ORDER — ATORVASTATIN CALCIUM 40 MG PO TABS: 40.0000 mg | ORAL_TABLET | Freq: Every day | ORAL | 3 refills | Status: DC

## 2023-02-24 NOTE — Telephone Encounter (Signed)
 *  STAT* If patient is at the pharmacy, call can be transferred to refill team.   1. Which medications need to be refilled? (please list name of each medication and dose if known)   atorvastatin (LIPITOR) 40 MG tablet   2. Would you like to learn more about the convenience, safety, & potential cost savings by using the [facility]?   3. Are you open to using the [facility]  4. Which pharmacy/location (including street and city if local pharmacy) is medication to be sent to?  [id number] - [place], [place] - [address]    5. Do they need a 30 day or 90 day supply?  30

## 2023-03-04 ENCOUNTER — Telehealth: Payer: Self-pay

## 2023-03-04 ENCOUNTER — Ambulatory Visit: Payer: Medicare HMO | Admitting: Internal Medicine

## 2023-03-04 ENCOUNTER — Encounter: Payer: Self-pay | Admitting: Internal Medicine

## 2023-03-04 ENCOUNTER — Other Ambulatory Visit (INDEPENDENT_AMBULATORY_CARE_PROVIDER_SITE_OTHER): Payer: Medicare HMO

## 2023-03-04 VITALS — BP 126/70 | HR 110 | Ht 66.0 in | Wt 276.0 lb

## 2023-03-04 DIAGNOSIS — D509 Iron deficiency anemia, unspecified: Secondary | ICD-10-CM

## 2023-03-04 DIAGNOSIS — K449 Diaphragmatic hernia without obstruction or gangrene: Secondary | ICD-10-CM

## 2023-03-04 DIAGNOSIS — Z8711 Personal history of peptic ulcer disease: Secondary | ICD-10-CM

## 2023-03-04 LAB — CBC WITH DIFFERENTIAL/PLATELET
Basophils Absolute: 0 10*3/uL (ref 0.0–0.1)
Basophils Relative: 0.4 % (ref 0.0–3.0)
Eosinophils Absolute: 0.2 10*3/uL (ref 0.0–0.7)
Eosinophils Relative: 1.9 % (ref 0.0–5.0)
HCT: 34.6 % — ABNORMAL LOW (ref 36.0–46.0)
Hemoglobin: 10.4 g/dL — ABNORMAL LOW (ref 12.0–15.0)
Lymphocytes Relative: 14.4 % (ref 12.0–46.0)
Lymphs Abs: 1.3 10*3/uL (ref 0.7–4.0)
MCHC: 30.1 g/dL (ref 30.0–36.0)
MCV: 75.7 fL — ABNORMAL LOW (ref 78.0–100.0)
Monocytes Absolute: 0.8 10*3/uL (ref 0.1–1.0)
Monocytes Relative: 9.2 % (ref 3.0–12.0)
Neutro Abs: 6.7 10*3/uL (ref 1.4–7.7)
Neutrophils Relative %: 74.1 % (ref 43.0–77.0)
Platelets: 338 10*3/uL (ref 150.0–400.0)
RBC: 4.57 Mil/uL (ref 3.87–5.11)
RDW: 24.6 % — ABNORMAL HIGH (ref 11.5–15.5)
WBC: 9.1 10*3/uL (ref 4.0–10.5)

## 2023-03-04 LAB — IBC + FERRITIN
Ferritin: 31.7 ng/mL (ref 10.0–291.0)
Iron: 27 ug/dL — ABNORMAL LOW (ref 42–145)
Saturation Ratios: 6.3 % — ABNORMAL LOW (ref 20.0–50.0)
TIBC: 428.4 ug/dL (ref 250.0–450.0)
Transferrin: 306 mg/dL (ref 212.0–360.0)

## 2023-03-04 LAB — COMPREHENSIVE METABOLIC PANEL
ALT: 9 U/L (ref 0–35)
AST: 15 U/L (ref 0–37)
Albumin: 4 g/dL (ref 3.5–5.2)
Alkaline Phosphatase: 121 U/L — ABNORMAL HIGH (ref 39–117)
BUN: 18 mg/dL (ref 6–23)
CO2: 28 meq/L (ref 19–32)
Calcium: 9.8 mg/dL (ref 8.4–10.5)
Chloride: 102 meq/L (ref 96–112)
Creatinine, Ser: 1.13 mg/dL (ref 0.40–1.20)
GFR: 50.43 mL/min — ABNORMAL LOW (ref >60.00)
Glucose, Bld: 114 mg/dL — ABNORMAL HIGH (ref 70–99)
Potassium: 3.4 meq/L — ABNORMAL LOW (ref 3.5–5.1)
Sodium: 140 meq/L (ref 135–145)
Total Bilirubin: 0.5 mg/dL (ref 0.2–1.2)
Total Protein: 7.9 g/dL (ref 6.0–8.3)

## 2023-03-04 NOTE — Telephone Encounter (Signed)
[facility] Pre-operative Risk Assessment     Request for surgical clearance:     Endoscopy Procedure  What type of surgery is being performed?     Endoscopy  When is this surgery scheduled?     04/19/23  What type of clearance is required ?   Pharmacy  Are there any medications that need to be held prior to surgery and how long? Xarelto 2 day's hold  Practice name and name of physician performing surgery?      [facility]  What is your office phone and fax number?      Phone- [phone]  Fax- [phone]  Anesthesia type (None, local, MAC, general) ?       MAC

## 2023-03-04 NOTE — Progress Notes (Signed)
Chief Complaint: IDA  HPI : 67 year old female with history of A-fib on Xarelto, HFpEF, aortic stenosis, CVA in 12/2022, obesity, prior gastric ulcer, hiatal hernia, and prior Sheria Lang erosions presents with IDA  Patient was admitted 9/24 to 01/15/23 for an acute CVA.  At that time she was found to have significant iron deficiency anemia and underwent EGD and colonoscopy at that time.  Her IDA was attributed to gastric ulcers and Sheria Lang erosions that were found and she was started on PPI twice daily therapy.  Denies any blood in the stools. She is still on Xarelto. Denies fatigue and lightheadedness. She is cold natured but this is normal for her. Denies NSAID use currently. She stopped NSAIDs since her recent hospitalization. She took ferrous sulfate 325 mg every day starting on 11/4 after her cardiology visit.  Prior to that clinic visit, she was not taking any iron supplementation.  Patient is currently on Xarelto therapy.  She is still taking pantoprazole 40 mg twice a day and she finishes the BID dosing in December, before starting on the once a day dosing.   Past Medical History:  Diagnosis Date   Acute ischemic stroke (HCC) 01/13/2023   Aortic stenosis    ARDS (adult respiratory distress syndrome) (HCC) 03/28/2012   Heart murmur    Hypertension    Influenza, pneumonia 03/20/2012   Associated with ARDS, Septic shock   Obesity (BMI 30.0-34.9)    Symptomatic anemia 03/24/2012     Past Surgical History:  Procedure Laterality Date   BIOPSY  01/15/2023   Procedure: BIOPSY;  Surgeon: Imogene Burn, MD;  Location: [facility] ENDOSCOPY;  Service: Gastroenterology;;   CHOLECYSTECTOMY     CHOLECYSTECTOMY     COLONOSCOPY WITH PROPOFOL N/A 01/15/2023   Procedure: COLONOSCOPY WITH PROPOFOL;  Surgeon: Imogene Burn, MD;  Location: [facility] ENDOSCOPY;  Service: Gastroenterology;  Laterality: N/A;   ESOPHAGOGASTRODUODENOSCOPY (EGD) WITH PROPOFOL N/A 01/15/2023   Procedure: ESOPHAGOGASTRODUODENOSCOPY  (EGD) WITH PROPOFOL;  Surgeon: Imogene Burn, MD;  Location: [facility] ENDOSCOPY;  Service: Gastroenterology;  Laterality: N/A;   HERNIA REPAIR     POLYPECTOMY  01/15/2023   Procedure: POLYPECTOMY;  Surgeon: Imogene Burn, MD;  Location: [facility] ENDOSCOPY;  Service: Gastroenterology;;   TUBAL LIGATION     Family History  Problem Relation Age of Onset   Pancreatic cancer Mother    Lung cancer Father    Hypertension Other    Social History   Tobacco Use   Smoking status: Never   Smokeless tobacco: Never  Vaping Use   Vaping status: Never Used  Substance Use Topics   Alcohol use: Yes    Comment: occasional   Drug use: No   Current Outpatient Medications  Medication Sig Dispense Refill   acetaminophen (TYLENOL) 500 MG tablet Take 1,000 mg by mouth daily.     amLODipine (NORVASC) 10 MG tablet Take 10 mg by mouth daily.     atorvastatin (LIPITOR) 40 MG tablet Take 1 tablet (40 mg total) by mouth daily. 90 tablet 3   cyclobenzaprine (FLEXERIL) 10 MG tablet Take 10 mg by mouth at bedtime.     ferrous sulfate 325 (65 FE) MG EC tablet Take 1 tablet (325 mg total) by mouth every other day. Follow iron deficiency with PCP, follow with PCP for refills. 15 tablet 0   furosemide (LASIX) 40 MG tablet Take 1 tablet (40 mg total) by mouth daily. 30 tablet 1   metoprolol succinate (TOPROL-XL) 50 MG 24 hr tablet  Take 1 tablet (50 mg total) by mouth daily. Take with or immediately following a meal. 30 tablet 2   [START ON 03/26/2023] pantoprazole (PROTONIX) 40 MG tablet Take 1 tablet (40 mg total) by mouth 2 (two) times daily for 10 weeks (12/6), THEN 1 tablet (40 mg) once daily. (Patient taking differently: 1bid) 230 tablet 0   potassium chloride SA (KLOR-CON M) 20 MEQ tablet Take 1 tablet (20 mEq total) by mouth daily. Check labs with your PCP within 1 week (kidney function and electrolytes) 30 tablet 0   rivaroxaban (XARELTO) 20 MG TABS tablet Take 20 mg by mouth daily with supper.     No current  facility-administered medications for this visit.   Allergies  Allergen Reactions   Eliquis [Apixaban] Anaphylaxis   Penicillins Swelling   Codeine Nausea And Vomiting   Erythromycin     Mouth thrush- per pt   Sulfa Antibiotics     Mouth thrush- per pt     Review of Systems: All systems reviewed and negative except where noted in HPI.   Physical Exam: BP 126/70   Pulse (!) 110   Ht 5\' 6"  (1.676 m)   Wt 276 lb (125.2 kg)   BMI 44.55 kg/m  Constitutional: Pleasant,well-developed, female in no acute distress. HEENT: Normocephalic and atraumatic. Conjunctivae are normal. No scleral icterus. Cardiovascular: Mildly tachycardic, systolic murmur Pulmonary/chest: Effort normal and breath sounds normal. No wheezing, rales or rhonchi. Abdominal: Soft, nondistended, nontender. Bowel sounds active throughout. There are no masses palpable. No hepatomegaly. Extremities: Trace bilateral lower extremity edema Neurological: Alert and oriented to person place and time. Skin: Skin is warm and dry. No rashes noted. Psychiatric: Normal mood and affect. Behavior is normal.  Labs 02/2023: CBC with low hemoglobin of 7.6 and MCV of 77.7.  BMP with elevated creatinine of 1.07.  EGD 01/15/23: Salmon- colored mucosa was present. The maximum longitudinal extent of these esophageal mucosal changes was 2 cm in length. Mucosa was biopsied with a cold forceps for histology. One specimen bottle was sent to pathology. A large hiatal hernia was present. Multiple localized erosions with no bleeding and no stigmata of recent bleeding were found in the cardia. Three non- bleeding linear gastric ulcers with a clean ulcer base ( Forrest Class III) were found at the pylorus. The largest lesion was 8 mm in largest dimension. Biopsies were taken near the ulcers and from the gastric antrum, pylorus, fundus, and cardia with a cold forceps for Helicobacter pylori testing. The examined duodenum was normal. Biopsies were  taken with a cold forceps for histology. Path: A. DUODENUM, BIOPSY:  Duodenal mucosa with normal villous architecture.  No villous atrophy or increased intraepithelial lymphocytes.  B. STOMACH, BIOPSY:  Gastric antral and oxyntic mucosa with changes consistent with reactive  gastropathy.  Negative for Helicobacter pylori.  C. ESOPHAGUS, DISTAL, BIOPSY:  Gastroesophageal mucosa with erosion and inflammation consistent with reflux.  Negative for intestinal metaplasia or dysplasia.   Colonoscopy 01/15/23: - The examined portion of the ileum was normal. - Diverticulosis in the sigmoid colon and in the descending colon. - One 4 mm polyp in the sigmoid colon, removed with a cold snare. Resected and retrieved. - Non- bleeding internal hemorrhoids. Path: D. COLON, SIGMOID, POLYPECTOMY:  Hyperplastic polyp.   ASSESSMENT AND PLAN: IDA Large hiatal hernia History of gastric ulcers History of Cameron erosions Patient presents for follow-up of iron deficiency anemia.  She was hospitalized 2 months ago for an acute stroke and severe iron deficiency anemia.  At that time her IDA was attributed to gastric ulcers and Cameron erosions.  She is currently still on twice daily PPI therapy since her prior hospitalization.  After a recent cardiology clinic visit on 11/4, she was recently started on oral iron supplementation.  Prior to that she was not on any oral iron supplements, which could potentially explain why her last hemoglobin was quite low at 7.6.  Will recheck her hemoglobin and iron levels today to see if her blood counts are starting to respond to the oral iron supplementation.  If her blood counts are not starting to respond, patient may need to be started on IV iron therapy in the future.  Will plan for an follow-up EGD in 1 month in order to assess for healing of her prior gastric ulcers.  - Continue PPI twice daily - Continue ferrous sulfate daily - Check CBC, ferritin/IBC. Will add on CMP per  patient request - Continue ferrous sulfate 325 mg QD - EGD [facility] at the end of December. Will get permission to hold Xarelto 2 days beforehand  Eulah Pont, MD  I spent 41 minutes of time, including in depth chart review, independent review of results as outlined above, communicating results with the patient directly, face-to-face time with the patient, coordinating care, ordering studies and medications as appropriate, and documentation.

## 2023-03-04 NOTE — Patient Instructions (Signed)
Your provider has requested that you go to the basement level for lab work before leaving today. Press "B" on the elevator. The lab is located at the first door on the left as you exit the elevator.  You have been scheduled for an endoscopy. Please follow written instructions given to you at your visit today.  If you use inhalers (even only as needed), please bring them with you on the day of your procedure.  If you take any of the following medications, they will need to be adjusted prior to your procedure:   DO NOT TAKE 7 DAYS PRIOR TO TEST- Trulicity (dulaglutide) Ozempic, Wegovy (semaglutide) Mounjaro (tirzepatide) Bydureon Bcise (exanatide extended release)  DO NOT TAKE 1 DAY PRIOR TO YOUR TEST Rybelsus (semaglutide) Adlyxin (lixisenatide) Victoza (liraglutide) Byetta (exanatide) ___________________________________________________________________________  _______________________________________________________  If your blood pressure at your visit was 140/90 or greater, please contact your primary care physician to follow up on this.  _______________________________________________________  If you are age 73 or older, your body mass index should be between 23-30. Your Body mass index is 44.55 kg/m. If this is out of the aforementioned range listed, please consider follow up with your Primary Care Provider.  If you are age 62 or younger, your body mass index should be between 19-25. Your Body mass index is 44.55 kg/m. If this is out of the aformentioned range listed, please consider follow up with your Primary Care Provider.   ________________________________________________________  The [facility] providers would like to encourage you to use [facility] to communicate with providers for non-urgent requests or questions.  Due to long hold times on the telephone, sending your provider a message by [facility] may be a faster and more efficient way to get a response.  Please allow 48  business hours for a response.  Please remember that this is for non-urgent requests.  _______________________________________________________  Due to recent changes in healthcare laws, you may see the results of your imaging and laboratory studies on MyChart before your provider has had a chance to review them.  We understand that in some cases there may be results that are confusing or concerning to you. Not all laboratory results come back in the same time frame and the provider may be waiting for multiple results in order to interpret others.  Please give [place] 48 hours in order for your provider to thoroughly review all the results before contacting the office for clarification of your results.   Thank you for entrusting me with your care and for choosing [facility], Dr. Eulah Pont

## 2023-03-05 NOTE — Telephone Encounter (Signed)
Patient with diagnosis of afib on Xarelto for anticoagulation.    Procedure: endoscopy Date of procedure: 04/19/23   CHA2DS2-VASc Score = 6   This indicates a 9.7% annual risk of stroke. The patient's score is based upon: CHF History: 1 HTN History: 1 Diabetes History: 0 Stroke History: 2 Vascular Disease History: 0 Age Score: 1 Gender Score: 1      CrCl 65 ml/min Platelet count 338  Per office protocol, patient can hold Xarelto for 2 days prior to procedure.    **This guidance is not considered finalized until pre-operative APP has relayed final recommendations.**

## 2023-03-08 ENCOUNTER — Telehealth: Payer: Self-pay

## 2023-03-08 NOTE — Telephone Encounter (Signed)
Received fax from cardiology okay to hold Xarelto 2 days prior to procedure called patient no answer left message on voice mail to call office.

## 2023-03-08 NOTE — Telephone Encounter (Signed)
   Patient Name: Megan Espinoza  DOB: 1955-07-05 MRN: [id number]  Primary Cardiologist: None  Clinical pharmacists have reviewed the patient's past medical history, labs, and current medications as part of preoperative protocol coverage. The following recommendations have been made:  Per office protocol, patient can hold Xarelto for 2 days prior to procedure.    I will route this recommendation to the requesting party via Epic fax function and remove from pre-op pool.  Please call with questions.  Napoleon Form, Leodis Rains, NP 03/08/2023, 8:04 AM

## 2023-03-09 NOTE — Telephone Encounter (Signed)
Spoke to patient advised okay to hold Xarelto 2 days hold prior to procedure

## 2023-03-20 ENCOUNTER — Other Ambulatory Visit: Payer: Self-pay | Admitting: Cardiology

## 2023-03-22 ENCOUNTER — Encounter: Payer: Self-pay | Admitting: Cardiology

## 2023-03-24 ENCOUNTER — Other Ambulatory Visit (HOSPITAL_BASED_OUTPATIENT_CLINIC_OR_DEPARTMENT_OTHER): Payer: Self-pay

## 2023-03-24 NOTE — Telephone Encounter (Signed)
Spoke to Pharmacy [facility] fixed Qty to 90 with 3 refills

## 2023-04-16 ENCOUNTER — Telehealth: Payer: Self-pay | Admitting: Internal Medicine

## 2023-04-16 NOTE — Telephone Encounter (Signed)
Copied the instructions given to her at the last office visit. Transmitted them through My Chart.

## 2023-04-16 NOTE — Telephone Encounter (Signed)
Patient called and stated that she is needing her instruction for her procedure. Patient is requesting that they be sent over to her mychart. Please advise.

## 2023-04-19 ENCOUNTER — Ambulatory Visit: Payer: Medicare HMO | Admitting: Internal Medicine

## 2023-04-19 ENCOUNTER — Encounter: Payer: Self-pay | Admitting: Internal Medicine

## 2023-04-19 VITALS — BP 130/73 | HR 88 | Temp 98.4°F | Resp 15 | Ht 66.0 in | Wt 276.0 lb

## 2023-04-19 DIAGNOSIS — K2270 Barrett's esophagus without dysplasia: Secondary | ICD-10-CM | POA: Diagnosis not present

## 2023-04-19 DIAGNOSIS — K2950 Unspecified chronic gastritis without bleeding: Secondary | ICD-10-CM | POA: Diagnosis not present

## 2023-04-19 DIAGNOSIS — Z8711 Personal history of peptic ulcer disease: Secondary | ICD-10-CM

## 2023-04-19 DIAGNOSIS — D509 Iron deficiency anemia, unspecified: Secondary | ICD-10-CM | POA: Diagnosis not present

## 2023-04-19 DIAGNOSIS — K449 Diaphragmatic hernia without obstruction or gangrene: Secondary | ICD-10-CM | POA: Diagnosis not present

## 2023-04-19 DIAGNOSIS — K31A0 Gastric intestinal metaplasia, unspecified: Secondary | ICD-10-CM

## 2023-04-19 DIAGNOSIS — Z8719 Personal history of other diseases of the digestive system: Secondary | ICD-10-CM

## 2023-04-19 MED ORDER — SODIUM CHLORIDE 0.9 % IV SOLN: 500.0000 mL | Freq: Once | INTRAVENOUS | Status: DC

## 2023-04-19 NOTE — Progress Notes (Signed)
Pt's states no medical or surgical changes since previsit or office visit. 

## 2023-04-19 NOTE — Progress Notes (Signed)
To pacu, VSS. Report to [email or web address].tb 

## 2023-04-19 NOTE — Op Note (Addendum)
[facility] Patient Name: Megan Espinoza Procedure Date: 04/19/2023 2:08 PM MRN: [id number] Endoscopist: Madelyn Brunner Winn , , [id number] Age: 67 Referring MD:  Date of Birth: 1955/05/13 Gender: Female Account #: [id number] Procedure:                Upper GI endoscopy Indications:              Iron deficiency anemia, Follow-up of gastric ulcer Medicines:                Monitored Anesthesia Care Procedure:                Pre-Anesthesia Assessment:                           - Prior to the procedure, a History and Physical                            was performed, and patient medications and                            allergies were reviewed. The patient's tolerance of                            previous anesthesia was also reviewed. The risks                            and benefits of the procedure and the sedation                            options and risks were discussed with the patient.                            All questions were answered, and informed consent                            was obtained. Prior Anticoagulants: The patient has                            taken Xarelto (rivaroxaban), last dose was 4 days                            prior to procedure. ASA Grade Assessment: III - A                            patient with severe systemic disease. After                            reviewing the risks and benefits, the patient was                            deemed in satisfactory condition to undergo the                            procedure.  After obtaining informed consent, the endoscope was                            passed under direct vision. Throughout the                            procedure, the patient's blood pressure, pulse, and                            oxygen saturations were monitored continuously. The                            GIF [id number] #[id number] was introduced through the                            mouth, and advanced  to the second part of duodenum.                            The upper GI endoscopy was accomplished without                            difficulty. The patient tolerated the procedure                            well. Scope In: Scope Out: Findings:                 White nummular lesions were noted in the distal                            esophagus. Biopsies were taken with a cold forceps                            for histology.                           A 5 cm hiatal hernia was present.                           The examined duodenum was normal.                           The exam was otherwise without abnormality. Complications:            No immediate complications. Estimated Blood Loss:     Estimated blood loss was minimal. Impression:               - White nummular lesions in esophageal mucosa.                            Biopsied.                           - 5 cm hiatal hernia.                           - Normal examined duodenum.                           -  The examination was otherwise normal. Recommendation:           - Discharge patient to home (with escort).                           - Await pathology results.                           - Continue oral iron supplement.                           - Okay to restart Xarelto tomorrow.                           - Return to GI clinic in 3-4 months to follow up                            iron deficiency anemia.                           - The findings and recommendations were discussed                            with the patient. Dr Particia Lather [address]" [place],  04/19/2023 2:34:42 PM

## 2023-04-19 NOTE — Progress Notes (Signed)
Called to room to assist during endoscopic procedure.  Patient ID and intended procedure confirmed with present staff. Received instructions for my participation in the procedure from the performing physician.  

## 2023-04-19 NOTE — Patient Instructions (Signed)
Educational handout provided to patient related to Hiatal Hernia  Resume previous diet  RESTART XARELTO ON 04/20/2023  CONTINUE ORAL  IRON SUPPLEMENT  Awaiting pathology results   YOU HAD AN ENDOSCOPIC PROCEDURE TODAY AT THE [facility]:   Refer to the procedure report that was given to you for any specific questions about what was found during the examination.  If the procedure report does not answer your questions, please call your gastroenterologist to clarify.  If you requested that your care partner not be given the details of your procedure findings, then the procedure report has been included in a sealed envelope for you to review at your convenience later.  YOU SHOULD EXPECT: Some feelings of bloating in the abdomen. Passage of more gas than usual.  Walking can help get rid of the air that was put into your GI tract during the procedure and reduce the bloating. If you had a lower endoscopy (such as a colonoscopy or flexible sigmoidoscopy) you may notice spotting of blood in your stool or on the toilet paper. If you underwent a bowel prep for your procedure, you may not have a normal bowel movement for a few days.  Please Note:  You might notice some irritation and congestion in your nose or some drainage.  This is from the oxygen used during your procedure.  There is no need for concern and it should clear up in a day or so.  SYMPTOMS TO REPORT IMMEDIATELY:  Following upper endoscopy (EGD)  Vomiting of blood or coffee ground material  New chest pain or pain under the shoulder blades  Painful or persistently difficult swallowing  New shortness of breath  Fever of 100F or higher  Black, tarry-looking stools  For urgent or emergent issues, a gastroenterologist can be reached at any hour by calling [phone]. Do not use MyChart messaging for urgent concerns.    DIET:  We do recommend a small meal at first, but then you may proceed to your regular diet.  Drink  plenty of fluids but you should avoid alcoholic beverages for 24 hours.  ACTIVITY:  You should plan to take it easy for the rest of today and you should NOT DRIVE or use heavy machinery until tomorrow (because of the sedation medicines used during the test).    FOLLOW UP: Our staff will call the number listed on your records the next business day following your procedure.  We will call around 7:15- 8:00 am to check on you and address any questions or concerns that you may have regarding the information given to you following your procedure. If we do not reach you, we will leave a message.     If any biopsies were taken you will be contacted by phone or by letter within the next 1-3 weeks.  Please call us at [phone] if you have not heard about the biopsies in 3 weeks.    SIGNATURES/CONFIDENTIALITY: You and/or your care partner have signed paperwork which will be entered into your electronic medical record.  These signatures attest to the fact that that the information above on your After Visit Summary has been reviewed and is understood.  Full responsibility of the confidentiality of this discharge information lies with you and/or your care-partner.

## 2023-04-19 NOTE — Progress Notes (Signed)
GASTROENTEROLOGY PROCEDURE H&P NOTE   Primary Care Physician: Ellyn Hack, MD    Reason for Procedure:   IDA, history of gastric ulcer, history of Cameron erosions  Plan:    EGD  Patient is appropriate for endoscopic procedure(s) in the ambulatory ([facility]) setting.  The nature of the procedure, as well as the risks, benefits, and alternatives were carefully and thoroughly reviewed with the patient. Ample time for discussion and questions allowed. The patient understood, was satisfied, and agreed to proceed.     HPI: Megan Espinoza is a 67 y.o. female who presents for EGD for evaluation of IDA, history of gastric ulcer, history of Cameron erosions .  Patient was most recently seen in the Gastroenterology Clinic on 03/04/23. Patient is now on once daily PPI therapy. Please refer to that note for full details regarding GI history and clinical presentation.   Past Medical History:  Diagnosis Date   Acute ischemic stroke (HCC) 01/13/2023   Aortic stenosis    ARDS (adult respiratory distress syndrome) (HCC) 03/28/2012   Heart murmur    Hypertension    Influenza, pneumonia 03/20/2012   Associated with ARDS, Septic shock   Obesity (BMI 30.0-34.9)    Symptomatic anemia 03/24/2012    Past Surgical History:  Procedure Laterality Date   BIOPSY  01/15/2023   Procedure: BIOPSY;  Surgeon: Imogene Burn, MD;  Location[facility] ENDOSCOPY;  Service: Gastroenterology;;   CHOLECYSTECTOMY     CHOLECYSTECTOMY     COLONOSCOPY WITH PROPOFOL N/A 01/15/2023   Procedure: COLONOSCOPY WITH PROPOFOL;  Surgeon: Imogene Burn, MD;  Location: [facility] ENDOSCOPY;  Service: Gastroenterology;  Laterality: N/A;   ESOPHAGOGASTRODUODENOSCOPY (EGD) WITH PROPOFOL N/A 01/15/2023   Procedure: ESOPHAGOGASTRODUODENOSCOPY (EGD) WITH PROPOFOL;  Surgeon: Imogene Burn, MD;  Location: [facility] ENDOSCOPY;  Service: Gastroenterology;  Laterality: N/A;   HERNIA REPAIR     POLYPECTOMY  01/15/2023   Procedure: POLYPECTOMY;   Surgeon: Imogene Burn, MD;  Location: [facility] ENDOSCOPY;  Service: Gastroenterology;;   TUBAL LIGATION      Prior to Admission medications   Medication Sig Start Date End Date Taking? Authorizing Provider  acetaminophen (TYLENOL) 500 MG tablet Take 1,000 mg by mouth daily.   Yes [provider]  amLODipine (NORVASC) 10 MG tablet Take 10 mg by mouth daily. 06/21/21  Yes [provider]  atorvastatin (LIPITOR) 40 MG tablet Take 1 tablet (40 mg total) by mouth daily. 02/24/23  Yes [place], Peter M, MD  cyclobenzaprine (FLEXERIL) 10 MG tablet Take 10 mg by mouth at bedtime. 12/31/22  Yes [provider]  empagliflozin (JARDIANCE) 10 MG TABS tablet Take 1 tablet (10 mg total) by mouth daily before breakfast. 03/22/23  Yes [place], Peter M, MD  ferrous sulfate 325 (65 FE) MG EC tablet Take 1 tablet (325 mg total) by mouth every other day. Follow iron deficiency with PCP, follow with PCP for refills. 02/19/23 04/19/23 Yes Zigmund Daniel., MD  furosemide (LASIX) 40 MG tablet Take 1 tablet (40 mg total) by mouth daily. 02/19/23 02/19/24 Yes Zigmund Daniel., MD  metoprolol succinate (TOPROL-XL) 50 MG 24 hr tablet Take 1 tablet (50 mg total) by mouth daily. Take with or immediately following a meal. 02/20/23 04/19/23 Yes Zigmund Daniel., MD  pantoprazole (PROTONIX) 40 MG tablet Take 1 tablet (40 mg total) by mouth 2 (two) times daily for 10 weeks (12/6), THEN 1 tablet (40 mg) once daily. Patient taking differently: 1bid 03/26/23 09/02/23 Yes  Levin Erp, MD  potassium chloride SA (KLOR-CON M20) 20 MEQ tablet TAKE 1 TABLET BY MOUTH DAILY. CHECK LABS WITH YOUR PCP WITHIN 1 WEEK (KIDNEY FUNCTION/ELECTROLYTES) 03/24/23  Yes [place], Peter M, MD  rivaroxaban (XARELTO) 20 MG TABS tablet Take 20 mg by mouth daily with supper.    [provider]    Current Outpatient Medications  Medication Sig Dispense Refill   acetaminophen (TYLENOL) 500 MG tablet Take 1,000 mg by  mouth daily.     amLODipine (NORVASC) 10 MG tablet Take 10 mg by mouth daily.     atorvastatin (LIPITOR) 40 MG tablet Take 1 tablet (40 mg total) by mouth daily. 90 tablet 3   cyclobenzaprine (FLEXERIL) 10 MG tablet Take 10 mg by mouth at bedtime.     empagliflozin (JARDIANCE) 10 MG TABS tablet Take 1 tablet (10 mg total) by mouth daily before breakfast.     ferrous sulfate 325 (65 FE) MG EC tablet Take 1 tablet (325 mg total) by mouth every other day. Follow iron deficiency with PCP, follow with PCP for refills. 15 tablet 0   furosemide (LASIX) 40 MG tablet Take 1 tablet (40 mg total) by mouth daily. 30 tablet 1   metoprolol succinate (TOPROL-XL) 50 MG 24 hr tablet Take 1 tablet (50 mg total) by mouth daily. Take with or immediately following a meal. 30 tablet 2   pantoprazole (PROTONIX) 40 MG tablet Take 1 tablet (40 mg total) by mouth 2 (two) times daily for 10 weeks (12/6), THEN 1 tablet (40 mg) once daily. (Patient taking differently: 1bid) 230 tablet 0   potassium chloride SA (KLOR-CON M20) 20 MEQ tablet TAKE 1 TABLET BY MOUTH DAILY. CHECK LABS WITH YOUR PCP WITHIN 1 WEEK (KIDNEY FUNCTION/ELECTROLYTES) 30 tablet 3   rivaroxaban (XARELTO) 20 MG TABS tablet Take 20 mg by mouth daily with supper.     Current Facility-Administered Medications  Medication Dose Route Frequency Provider Last Rate Last Admin   0.9 %  sodium chloride infusion  500 mL Intravenous Once Imogene Burn, MD        Allergies as of 04/19/2023 - Review Complete 04/19/2023  Allergen Reaction Noted   Eliquis [apixaban] Anaphylaxis 02/17/2023   Penicillins Swelling 03/24/2012   Codeine Nausea And Vomiting 03/24/2012   Erythromycin Rash 04/17/2012   Sulfa antibiotics Rash 04/17/2012    Family History  Problem Relation Age of Onset   Pancreatic cancer Mother    Lung cancer Father    Hypertension Other    Colon cancer Neg Hx    Esophageal cancer Neg Hx    Rectal cancer Neg Hx    Stomach cancer Neg Hx     Social  History   Socioeconomic History   Marital status: Widowed    Spouse name: Not on file   Number of children: Not on file   Years of education: Not on file   Highest education level: Not on file  Occupational History   Not on file  Tobacco Use   Smoking status: Never   Smokeless tobacco: Never  Vaping Use   Vaping status: Never Used  Substance and Sexual Activity   Alcohol use: Yes    Comment: occasional   Drug use: No   Sexual activity: Not on file  Other Topics Concern   Not on file  Social History Narrative   Not on file   Social Drivers of Health   Financial Resource Strain: Not on file  Food Insecurity: No Food Insecurity (02/17/2023)  Hunger Vital Sign    Worried About Running Out of Food in the Last Year: Never true    Ran Out of Food in the Last Year: Never true  Transportation Needs: No Transportation Needs (02/17/2023)   PRAPARE - Administrator, Civil Service (Medical): No    Lack of Transportation (Non-Medical): No  Physical Activity: Not on file  Stress: Not on file  Social Connections: Not on file  Intimate Partner Violence: Not At Risk (02/17/2023)   Humiliation, Afraid, Rape, and Kick questionnaire    Fear of Current or Ex-Partner: No    Emotionally Abused: No    Physically Abused: No    Sexually Abused: No    Physical Exam: Vital signs in last 24 hours: BP (!) 141/93   Pulse (!) 106   Temp 98.4 F (36.9 C) (Skin)   Ht 5\' 6"  (1.676 m)   Wt 276 lb (125.2 kg)   SpO2 94%   BMI 44.55 kg/m  GEN: NAD EYE: Sclerae anicteric ENT: MMM CV: Non-tachycardic Pulm: No increased WOB GI: Soft NEURO:  Alert & Oriented   Eulah Pont, MD [facility]   04/19/2023 1:18 PM

## 2023-04-20 ENCOUNTER — Telehealth: Payer: Self-pay | Admitting: *Deleted

## 2023-04-20 NOTE — Telephone Encounter (Signed)
  Follow up Call-     04/19/2023   12:58 PM  Call back number  Post procedure Call Back phone  # [phone]  Permission to leave phone message Yes     Patient questions:  Do you have a fever, pain , or abdominal swelling? No. Pain Score  0 *  Have you tolerated food without any problems? Yes.    Have you been able to return to your normal activities? Yes.    Do you have any questions about your discharge instructions: Diet   No. Medications  No. Follow up visit  No.  Do you have questions or concerns about your Care? No.  Actions: * If pain score is 4 or above: No action needed, pain <4.

## 2023-04-26 ENCOUNTER — Encounter: Payer: Self-pay | Admitting: Internal Medicine

## 2023-04-26 LAB — SURGICAL PATHOLOGY

## 2023-05-21 NOTE — Progress Notes (Signed)
 Cardiology Office Note:    Date:  05/25/2023   ID:  Ly, Wass 06-Mar-1956, MRN [id number]  PCP:  Maree Leni Edyth DELENA, MD   [facility] Providers Cardiologist:  None     Referring MD: Maree Leni Edyth DELENA, MD   Chief Complaint  Patient presents with   Atrial Fibrillation    History of Present Illness:    Megan Espinoza is a 68 y.o. female seen at the request of Damian Christians NP for evaluation of atrial fibrillation, CHF,  and aortic stenosis. She was initially diagnosed with AFib in July on a visit with her PCP. She was sent to ED byut  had converted. She was started on Xarelto  and metoprolol . She was admitted Sept 24, 2024 with acute GI bleed with HGB of 7 and acute ischemic stroke. She was transfused. Xarelto  held. Had panendoscopy showing pyloric ulcers and gastritis. Large hiatal hernia. One small colon polyp. She had a right frontal coronal radiata CVA managed medically. Recommended switching to Eliquis  on DC. Echo showed normal LV function with LAE, moderate AS. Hgb at DC was 7.9.   She was readmitted 10/31-11/1/24 with acute SOB and edema. She was found to have acute pulmonary edema and diuresed with IV lasix . Was DC on lasix  40 mg daily. Her metoprolol  dose was increased. She had developed hives on Eliquis  so was switched back to Xarelto . Hgb was still low at 7.6. on Protonix  twice a day. Now on oral iron therapy  On follow up today she is doing well. States she feels the best she has in 2 years. No dyspnea, weight gain, edema, chest pain or palpitations. No bleeding. Works at [employer]. Very sedentary.   Past Medical History:  Diagnosis Date   Acute ischemic stroke (HCC) 01/13/2023   Aortic stenosis    ARDS (adult respiratory distress syndrome) (HCC) 03/28/2012   Heart murmur    Hypertension    Influenza, pneumonia 03/20/2012   Associated with ARDS, Septic shock   Obesity (BMI 30.0-34.9)    Symptomatic anemia 03/24/2012    Past Surgical History:   Procedure Laterality Date   BIOPSY  01/15/2023   Procedure: BIOPSY;  Surgeon: Federico Rosario BROCKS, MD;  Location: [facility] ENDOSCOPY;  Service: Gastroenterology;;   CHOLECYSTECTOMY     CHOLECYSTECTOMY     COLONOSCOPY WITH PROPOFOL  N/A 01/15/2023   Procedure: COLONOSCOPY WITH PROPOFOL ;  Surgeon: Federico Rosario BROCKS, MD;  Location: [facility] ENDOSCOPY;  Service: Gastroenterology;  Laterality: N/A;   ESOPHAGOGASTRODUODENOSCOPY (EGD) WITH PROPOFOL  N/A 01/15/2023   Procedure: ESOPHAGOGASTRODUODENOSCOPY (EGD) WITH PROPOFOL ;  Surgeon: Federico Rosario BROCKS, MD;  Location: [facility] ENDOSCOPY;  Service: Gastroenterology;  Laterality: N/A;   HERNIA REPAIR     POLYPECTOMY  01/15/2023   Procedure: POLYPECTOMY;  Surgeon: Federico Rosario BROCKS, MD;  Location: [facility] ENDOSCOPY;  Service: Gastroenterology;;   TUBAL LIGATION      Current Medications: Current Meds  Medication Sig   acetaminophen  (TYLENOL ) 500 MG tablet Take 1,000 mg by mouth daily.   amLODipine  (NORVASC ) 10 MG tablet Take 10 mg by mouth daily.   atorvastatin  (LIPITOR) 40 MG tablet Take 1 tablet (40 mg total) by mouth daily.   cyclobenzaprine  (FLEXERIL ) 10 MG tablet Take 10 mg by mouth at bedtime.   empagliflozin (JARDIANCE) 10 MG TABS tablet Take 1 tablet (10 mg total) by mouth daily before breakfast.   pantoprazole  (PROTONIX ) 40 MG tablet Take 1 tablet (40 mg total) by mouth 2 (two) times daily for 10 weeks (12/6), THEN  1 tablet (40 mg) once daily. (Patient taking differently: 1bid)   potassium chloride  SA (KLOR-CON  M20) 20 MEQ tablet TAKE 1 TABLET BY MOUTH DAILY. CHECK LABS WITH YOUR PCP WITHIN 1 WEEK (KIDNEY FUNCTION/ELECTROLYTES)   rivaroxaban  (XARELTO ) 20 MG TABS tablet Take 20 mg by mouth daily with supper.   [DISCONTINUED] furosemide  (LASIX ) 40 MG tablet Take 1 tablet (40 mg total) by mouth daily. (Patient taking differently: Take 12.5 mg by mouth daily.)     Allergies:   Eliquis  [apixaban ], Penicillins, Codeine, Erythromycin, and Sulfa  antibiotics   Social History    Socioeconomic History   Marital status: Widowed    Spouse name: Not on file   Number of children: Not on file   Years of education: Not on file   Highest education level: Not on file  Occupational History   Not on file  Tobacco Use   Smoking status: Never   Smokeless tobacco: Never  Vaping Use   Vaping status: Never Used  Substance and Sexual Activity   Alcohol use: Yes    Comment: occasional   Drug use: No   Sexual activity: Not Currently    Partners: Male    Comment: married  Other Topics Concern   Not on file  Social History Narrative   Not on file   Social Drivers of Health   Financial Resource Strain: Not on file  Food Insecurity: No Food Insecurity (02/17/2023)   Hunger Vital Sign    Worried About Running Out of Food in the Last Year: Never true    Ran Out of Food in the Last Year: Never true  Transportation Needs: No Transportation Needs (02/17/2023)   PRAPARE - Administrator, Civil Service (Medical): No    Lack of Transportation (Non-Medical): No  Physical Activity: Not on file  Stress: Not on file  Social Connections: Not on file     Family History: The patient's family history includes Hypertension in an other family member; Lung cancer in her father; Pancreatic cancer in her mother. There is no history of Colon cancer, Esophageal cancer, Rectal cancer, or Stomach cancer.  ROS:   Please see the history of present illness.     All other systems reviewed and are negative.  EKGs/Labs/Other Studies Reviewed:    The following studies were reviewed today:      Echo 01/14/23: IMPRESSIONS     1. Left ventricular ejection fraction, by estimation, is 60 to 65%. The  left ventricle has normal function. The left ventricle has no regional  wall motion abnormalities. Left ventricular diastolic parameters were  normal.   2. Right ventricular systolic function is normal. The right ventricular  size is normal.   3. Left atrial size was moderately  dilated.   4. The mitral valve is degenerative. Mild mitral valve regurgitation. No  evidence of mitral stenosis. Moderate mitral annular calcification.   5. The aortic valve is normal in structure. Aortic valve regurgitation is  not visualized. Moderate aortic valve stenosis. Aortic valve area, by VTI  measures 1.29 cm. Aortic valve mean gradient measures 19.0 mmHg. Aortic  valve Vmax measures 2.88 m/s.   6. The inferior vena cava is dilated in size with >50% respiratory  variability, suggesting right atrial pressure of 8 mmHg.   7. Agitated saline contrast bubble study was negative, with no evidence  of any interatrial shunt.        Recent Labs: 01/13/2023: TSH 5.761 02/17/2023: B Natriuretic Peptide 168.7 02/18/2023: Magnesium  2.0 03/04/2023: ALT  9; BUN 18; Creatinine, Ser 1.13; Hemoglobin 10.4; Platelets 338.0; Potassium 3.4; Sodium 140  Recent Lipid Panel    Component Value Date/Time   CHOL 134 01/13/2023 0338   TRIG 106 01/13/2023 0338   HDL 35 (L) 01/13/2023 0338   CHOLHDL 3.8 01/13/2023 0338   VLDL 21 01/13/2023 0338   LDLCALC 78 01/13/2023 0338     Risk Assessment/Calculations:    CHA2DS2-VASc Score = 6   This indicates a 9.7% annual risk of stroke. The patient's score is based upon: CHF History: 1 HTN History: 1 Diabetes History: 0 Stroke History: 2 Vascular Disease History: 0 Age Score: 1 Gender Score: 1       Physical Exam:    VS:  BP (!) 140/87   Pulse (!) 108   Ht 5' 4 (1.626 m)   Wt 278 lb 12.8 oz (126.5 kg)   SpO2 96%   BMI 47.86 kg/m     Wt Readings from Last 3 Encounters:  05/25/23 278 lb 12.8 oz (126.5 kg)  04/19/23 276 lb (125.2 kg)  03/04/23 276 lb (125.2 kg)     GEN:  Well nourished, morbidly obese,  in no acute distress HEENT: Normal NECK: No JVD; No carotid bruits LYMPHATICS: No lymphadenopathy CARDIAC: RRR with extrasystoles,  gr 2/6 harsh systolic murmur RUSB >> apex.  RESPIRATORY:  Clear to auscultation without rales,  wheezing or rhonchi  ABDOMEN: Soft, non-tender, non-distended MUSCULOSKELETAL: no  edema; No deformity  SKIN: Warm and dry NEUROLOGIC:  Alert and oriented x 3 PSYCHIATRIC:  Normal affect   ASSESSMENT:    No diagnosis found.  PLAN:    In order of problems listed above:  Paroxysmal AFib. HR is somewhat high but regular. Even post CV HR was 100. Will continue rate control with Toprol  XL and anticoagulation with Xarelto . If recurrent Afib would consider for ablation. Chronic diastolic CHF. Appears euvolemic today.  Continue lasix  40 mg daily with potassium supplement. Sodium restriction. Monitor weights and edema.  Moderate AS. Stable on exam. Will repeat Echo next fall.  Severe iron deficiency anemia. On labs per PCP. Protonix  BID. Follow up with GI.  Morbid obesity.  HTN  under fair control.          Medication Adjustments/Labs and Tests Ordered: Current medicines are reviewed at length with the patient today.  Concerns regarding medicines are outlined above.  No orders of the defined types were placed in this encounter.  Meds ordered this encounter  Medications   furosemide  (LASIX ) 40 MG tablet    Sig: Take 0.5 tablets (20 mg total) by mouth daily.    Dispense:  30 tablet    Refill:  1    There are no Patient Instructions on file for this visit.   Signed, Ichael Pullara, MD  05/25/2023 9:29 AM    [facility]

## 2023-05-25 ENCOUNTER — Ambulatory Visit: Payer: Medicare HMO | Attending: Cardiology | Admitting: Cardiology

## 2023-05-25 ENCOUNTER — Encounter: Payer: Self-pay | Admitting: Cardiology

## 2023-05-25 VITALS — BP 140/87 | HR 108 | Ht 64.0 in | Wt 278.8 lb

## 2023-05-25 DIAGNOSIS — I5032 Chronic diastolic (congestive) heart failure: Secondary | ICD-10-CM | POA: Diagnosis not present

## 2023-05-25 DIAGNOSIS — I480 Paroxysmal atrial fibrillation: Secondary | ICD-10-CM

## 2023-05-25 DIAGNOSIS — I350 Nonrheumatic aortic (valve) stenosis: Secondary | ICD-10-CM

## 2023-05-25 MED ORDER — FUROSEMIDE 40 MG PO TABS: 20.0000 mg | ORAL_TABLET | Freq: Every day | ORAL | 1 refills | Status: DC

## 2023-05-25 NOTE — Patient Instructions (Signed)
 Medication Instructions:  Continue same medications *If you need a refill on your cardiac medications before your next appointment, please call your pharmacy*   Lab Work: None ordered   Testing/Procedures: None ordered   Follow-Up: At [facility], you and your health needs are our priority.  As part of our continuing mission to provide you with exceptional heart care, we have created designated Provider Care Teams.  These Care Teams include your primary Cardiologist (physician) and Advanced Practice Providers (APPs -  Physician Assistants and Nurse Practitioners) who all work together to provide you with the care you need, when you need it.  We recommend signing up for the patient portal called "MyChart".  Sign up information is provided on this After Visit Summary.  MyChart is used to connect with patients for Virtual Visits (Telemedicine).  Patients are able to view lab/test results, encounter notes, upcoming appointments, etc.  Non-urgent messages can be sent to your provider as well.   To learn more about what you can do with MyChart, go to [email or web address].    Your next appointment:  6 months    Call in June to schedule August appointment     Provider:  Dr.Jordan

## 2023-07-19 ENCOUNTER — Ambulatory Visit: Payer: Medicare HMO | Admitting: Internal Medicine

## 2023-09-27 ENCOUNTER — Other Ambulatory Visit: Payer: Self-pay | Admitting: Cardiology

## 2023-10-01 ENCOUNTER — Encounter: Payer: Self-pay | Admitting: Internal Medicine

## 2023-10-01 ENCOUNTER — Other Ambulatory Visit (INDEPENDENT_AMBULATORY_CARE_PROVIDER_SITE_OTHER)

## 2023-10-01 ENCOUNTER — Ambulatory Visit: Admitting: Internal Medicine

## 2023-10-01 VITALS — BP 136/80 | HR 92 | Ht 62.0 in | Wt 282.1 lb

## 2023-10-01 DIAGNOSIS — K219 Gastro-esophageal reflux disease without esophagitis: Secondary | ICD-10-CM

## 2023-10-01 DIAGNOSIS — R748 Abnormal levels of other serum enzymes: Secondary | ICD-10-CM

## 2023-10-01 DIAGNOSIS — D509 Iron deficiency anemia, unspecified: Secondary | ICD-10-CM

## 2023-10-01 DIAGNOSIS — Z8711 Personal history of peptic ulcer disease: Secondary | ICD-10-CM

## 2023-10-01 DIAGNOSIS — K2270 Barrett's esophagus without dysplasia: Secondary | ICD-10-CM

## 2023-10-01 DIAGNOSIS — K449 Diaphragmatic hernia without obstruction or gangrene: Secondary | ICD-10-CM

## 2023-10-01 LAB — IBC + FERRITIN
Ferritin: 49.2 ng/mL (ref 10.0–291.0)
Iron: 49 ug/dL (ref 42–145)
Saturation Ratios: 14.6 % — ABNORMAL LOW (ref 20.0–50.0)
TIBC: 336 ug/dL (ref 250.0–450.0)
Transferrin: 240 mg/dL (ref 212.0–360.0)

## 2023-10-01 LAB — CBC WITH DIFFERENTIAL/PLATELET
Basophils Absolute: 0 10*3/uL (ref 0.0–0.1)
Basophils Relative: 0.6 % (ref 0.0–3.0)
Eosinophils Absolute: 0.2 10*3/uL (ref 0.0–0.7)
Eosinophils Relative: 3 % (ref 0.0–5.0)
HCT: 41.2 % (ref 36.0–46.0)
Hemoglobin: 13.6 g/dL (ref 12.0–15.0)
Lymphocytes Relative: 20.3 % (ref 12.0–46.0)
Lymphs Abs: 1.1 10*3/uL (ref 0.7–4.0)
MCHC: 33 g/dL (ref 30.0–36.0)
MCV: 88 fl (ref 78.0–100.0)
Monocytes Absolute: 0.6 10*3/uL (ref 0.1–1.0)
Monocytes Relative: 11.5 % (ref 3.0–12.0)
Neutro Abs: 3.6 10*3/uL (ref 1.4–7.7)
Neutrophils Relative %: 64.6 % (ref 43.0–77.0)
Platelets: 188 10*3/uL (ref 150.0–400.0)
RBC: 4.68 Mil/uL (ref 3.87–5.11)
RDW: 14.8 % (ref 11.5–15.5)
WBC: 5.6 10*3/uL (ref 4.0–10.5)

## 2023-10-01 LAB — COMPREHENSIVE METABOLIC PANEL WITH GFR
ALT: 14 U/L (ref 0–35)
AST: 17 U/L (ref 0–37)
Albumin: 4.2 g/dL (ref 3.5–5.2)
Alkaline Phosphatase: 117 U/L (ref 39–117)
BUN: 23 mg/dL (ref 6–23)
CO2: 27 meq/L (ref 19–32)
Calcium: 9.8 mg/dL (ref 8.4–10.5)
Chloride: 106 meq/L (ref 96–112)
Creatinine, Ser: 1.11 mg/dL (ref 0.40–1.20)
GFR: 51.32 mL/min — ABNORMAL LOW (ref >60.00)
Glucose, Bld: 118 mg/dL — ABNORMAL HIGH (ref 70–99)
Potassium: 4 meq/L (ref 3.5–5.1)
Sodium: 143 meq/L (ref 135–145)
Total Bilirubin: 0.7 mg/dL (ref 0.2–1.2)
Total Protein: 7.7 g/dL (ref 6.0–8.3)

## 2023-10-01 MED ORDER — PANTOPRAZOLE SODIUM 40 MG PO TBEC: 40.0000 mg | DELAYED_RELEASE_TABLET | Freq: Every day | ORAL | 3 refills | Status: AC

## 2023-10-01 NOTE — Patient Instructions (Signed)
 _______________________________________________________  If your blood pressure at your visit was 140/90 or greater, please contact your primary care physician to follow up on this.  _______________________________________________________  If you are age 68 or older, your body mass index should be between 23-30. Your Body mass index is 51.6 kg/m. If this is out of the aforementioned range listed, please consider follow up with your Primary Care Provider.  If you are age 64 or younger, your body mass index should be between 19-25. Your Body mass index is 51.6 kg/m. If this is out of the aformentioned range listed, please consider follow up with your Primary Care Provider.   ________________________________________________________  The [facility] providers would like to encourage you to use MYCHART to communicate with providers for non-urgent requests or questions.  Due to long hold times on the telephone, sending your provider a message by [facility] may be a faster and more efficient way to get a response.  Please allow 48 business hours for a response.  Please remember that this is for non-urgent requests.  _______________________________________________________  Your provider has requested that you go to the basement level for lab work before leaving today. Press B on the elevator. The lab is located at the first door on the left as you exit the elevator.  We have sent the following medications to your pharmacy for you to pick up at your convenience: Protonix   Please follow up in 6 months. Give us  a call at [phone] to schedule an appointment.  It was a pleasure to see you today!  Thank you for trusting me with your gastrointestinal care!

## 2023-10-01 NOTE — Progress Notes (Signed)
 Chief Complaint: IDA  HPI : 68 year old female with history of A-fib on Xarelto , HFpEF, aortic stenosis, CVA in 12/2022, obesity, prior gastric ulcer, hiatal hernia, and prior Donelda Fujita erosions presents for follow up of  Patient was admitted 9/24 to 01/15/23 for an acute CVA.  At that time she was found to have significant iron deficiency anemia and underwent EGD and colonoscopy at that time.  Her IDA was attributed to gastric ulcers and Donelda Fujita erosions that were found and she was started on PPI twice daily therapy, which was subsequently weaned down to every day.  Interval History: Denies blood in stools. Denies acid reflux. Denies N&V. Bowel habits are normal. She is eating and drinking well. Denies lightheadedness or SOB. She has been doing well on PPI every day. She has continued to take her daily iron supplement.  Past Medical History:  Diagnosis Date   Acute ischemic stroke (HCC) 01/13/2023   Aortic stenosis    ARDS (adult respiratory distress syndrome) (HCC) 03/28/2012   Heart murmur    Hypertension    Influenza, pneumonia 03/20/2012   Associated with ARDS, Septic shock   Obesity (BMI 30.0-34.9)    Symptomatic anemia 03/24/2012     Past Surgical History:  Procedure Laterality Date   BIOPSY  01/15/2023   Procedure: BIOPSY;  Surgeon: Daina Drum, MD;  Location: [facility] ENDOSCOPY;  Service: Gastroenterology;;   CHOLECYSTECTOMY     CHOLECYSTECTOMY     COLONOSCOPY WITH PROPOFOL  N/A 01/15/2023   Procedure: COLONOSCOPY WITH PROPOFOL ;  Surgeon: Daina Drum, MD;  Location: [facility] ENDOSCOPY;  Service: Gastroenterology;  Laterality: N/A;   ESOPHAGOGASTRODUODENOSCOPY (EGD) WITH PROPOFOL  N/A 01/15/2023   Procedure: ESOPHAGOGASTRODUODENOSCOPY (EGD) WITH PROPOFOL ;  Surgeon: Daina Drum, MD;  Location: [facility] ENDOSCOPY;  Service: Gastroenterology;  Laterality: N/A;   HERNIA REPAIR     POLYPECTOMY  01/15/2023   Procedure: POLYPECTOMY;  Surgeon: Daina Drum, MD;  Location: [facility] ENDOSCOPY;   Service: Gastroenterology;;   TUBAL LIGATION     Family History  Problem Relation Age of Onset   Pancreatic cancer Mother    Lung cancer Father    Hypertension Other    Colon cancer Neg Hx    Esophageal cancer Neg Hx    Rectal cancer Neg Hx    Stomach cancer Neg Hx    Social History   Tobacco Use   Smoking status: Never   Smokeless tobacco: Never  Vaping Use   Vaping status: Never Used  Substance Use Topics   Alcohol use: Yes    Comment: occasional   Drug use: No   Current Outpatient Medications  Medication Sig Dispense Refill   acetaminophen  (TYLENOL ) 500 MG tablet Take 1,000 mg by mouth daily.     amLODipine  (NORVASC ) 10 MG tablet Take 10 mg by mouth daily.     atorvastatin  (LIPITOR) 40 MG tablet Take 1 tablet (40 mg total) by mouth daily. 90 tablet 3   cyclobenzaprine  (FLEXERIL ) 10 MG tablet Take 10 mg by mouth at bedtime.     ferrous sulfate  325 (65 FE) MG EC tablet Take 1 tablet (325 mg total) by mouth every other day. Follow iron deficiency with PCP, follow with PCP for refills. (Patient taking differently: Take 325 mg by mouth daily.) 15 tablet 0   furosemide  (LASIX ) 40 MG tablet TAKE 1/2 TABLET(20 MG) BY MOUTH DAILY 45 tablet 3   metoprolol  succinate (TOPROL -XL) 50 MG 24 hr tablet Take 1 tablet (50 mg total) by mouth daily. Take with  or immediately following a meal. 30 tablet 2   pantoprazole  (PROTONIX ) 40 MG tablet Take 1 tablet (40 mg total) by mouth 2 (two) times daily for 10 weeks (12/6), THEN 1 tablet (40 mg) once daily. 230 tablet 0   potassium chloride  SA (KLOR-CON  M20) 20 MEQ tablet TAKE 1 TABLET BY MOUTH DAILY. CHECK LABS WITH YOUR PCP WITHIN 1 WEEK (KIDNEY FUNCTION/ELECTROLYTES) 30 tablet 3   rivaroxaban  (XARELTO ) 20 MG TABS tablet Take 20 mg by mouth daily with supper.     No current facility-administered medications for this visit.   Allergies  Allergen Reactions   Eliquis  [Apixaban ] Anaphylaxis   Penicillins Swelling   Jardiance [Empagliflozin]      Severe yeast infection   Codeine Nausea And Vomiting   Erythromycin Rash    Mouth thrush- per pt   Sulfa  Antibiotics Rash    Mouth thrush- per pt    Physical Exam: BP 136/80 (BP Location: Left Arm, Patient Position: Sitting, Cuff Size: Large)   Pulse 92   Ht 5' 2 (1.575 m) Comment: height measured without shoes  Wt 282 lb 2 oz (128 kg)   BMI 51.60 kg/m  Constitutional: Pleasant,well-developed, female in no acute distress. HEENT: Normocephalic and atraumatic. Conjunctivae are normal. No scleral icterus. Cardiovascular: Mildly tachycardic, systolic murmur Pulmonary/chest: Effort normal and breath sounds normal. No wheezing, rales or rhonchi. Abdominal: Soft, nondistended, nontender. Bowel sounds active throughout. There are no masses palpable. No hepatomegaly. Extremities: No bilateral lower extremity edema Neurological: Alert and oriented to person place and time. Skin: Skin is warm and dry. No rashes noted. Psychiatric: Normal mood and affect. Behavior is normal.  Labs 02/2023: CBC with low hemoglobin of 7.6 and MCV of 77.7.  BMP with elevated creatinine of 1.07.  EGD 01/15/23: Salmon- colored mucosa was present. The maximum longitudinal extent of these esophageal mucosal changes was 2 cm in length. Mucosa was biopsied with a cold forceps for histology. One specimen bottle was sent to pathology. A large hiatal hernia was present. Multiple localized erosions with no bleeding and no stigmata of recent bleeding were found in the cardia. Three non- bleeding linear gastric ulcers with a clean ulcer base ( Forrest Class III) were found at the pylorus. The largest lesion was 8 mm in largest dimension. Biopsies were taken near the ulcers and from the gastric antrum, pylorus, fundus, and cardia with a cold forceps for Helicobacter pylori testing. The examined duodenum was normal. Biopsies were taken with a cold forceps for histology. Path: A. DUODENUM, BIOPSY:  Duodenal mucosa with normal  villous architecture.  No villous atrophy or increased intraepithelial lymphocytes.  B. STOMACH, BIOPSY:  Gastric antral and oxyntic mucosa with changes consistent with reactive  gastropathy.  Negative for Helicobacter pylori.  C. ESOPHAGUS, DISTAL, BIOPSY:  Gastroesophageal mucosa with erosion and inflammation consistent with reflux.  Negative for intestinal metaplasia or dysplasia.   Colonoscopy 01/15/23: - The examined portion of the ileum was normal. - Diverticulosis in the sigmoid colon and in the descending colon. - One 4 mm polyp in the sigmoid colon, removed with a cold snare. Resected and retrieved. - Non- bleeding internal hemorrhoids. Path: D. COLON, SIGMOID, POLYPECTOMY:  Hyperplastic polyp.   EGD 04/19/23: - White nummular lesions in esophageal mucosa. Biopsied. - 5 cm hiatal hernia. - Normal examined duodenum. - The examination was otherwise normal. Path: 1. Surgical [P], esophagus bx :       COMPATIBLE WITH BARRETT'S ESOPHAGUS       REACTIVE SQUAMOUS MUCOSA  MILD CHRONIC GASTRITIS WITH INTESTINAL METAPLASIA       NEGATIVE FOR DYSPLASIA AND CARCINOMA       NEGATIVE FOR ACUTE INFLAMMATION AND FUNGAL ORGANISMS   ASSESSMENT AND PLAN: IDA Large hiatal hernia Barrett's esophagus History of gastric ulcers History of Cameron erosions Mildly elevated alkaline phosphatase Patient presents for follow-up of iron deficiency anemia ,which was previously attributed to gastric ulcers and Cameron erosions seen on her last EGD. Her last check of her blood counts suggested that her Hb was improving over time on oral iron supplements. Will plan to recheck her labs today to see if her blood counts are continuing to improve. She was noted to have a mildly elevated alkaline phosphatase on her last CMP, which is likely due to fatty liver. She has been doing well on her pantoprazole  therapy. Her last EGD in 03/2023 confirmed healing of her prior gastric ulcers. She was also noted to have  Barrett's esophagus on her last EGD so this will need to be followed in the future - Check CBC, ferritin/IBC, CMP - Continue pantoprazole  40 mg QD. Refill.  - Continue ferrous sulfate  daily - EGD for Barrett's surveillance due in 03/2028 - RTC 6 months  Regino Caprio, MD  I spent 31 minutes of time, including in depth chart review, independent review of results as outlined above, communicating results with the patient directly, face-to-face time with the patient, coordinating care, ordering studies and medications as appropriate, and documentation.

## 2023-10-04 ENCOUNTER — Ambulatory Visit: Payer: Self-pay | Admitting: Internal Medicine

## 2023-10-27 ENCOUNTER — Encounter: Payer: Self-pay | Admitting: Cardiology

## 2023-11-03 ENCOUNTER — Ambulatory Visit: Admitting: Orthopaedic Surgery

## 2023-11-17 ENCOUNTER — Other Ambulatory Visit: Payer: Self-pay | Admitting: Cardiology

## 2023-12-15 NOTE — Progress Notes (Unsigned)
 Cardiology Office Note:    Date:  12/17/2023   ID:  Espinoza, Megan Jan 08, 1956, MRN [id number]  PCP:  Maree Leni Edyth DELENA, MD   [facility] Providers Cardiologist:  None     Referring MD: Maree Leni Edyth DELENA, MD   Chief Complaint  Patient presents with   Atrial Fibrillation   Aortic Stenosis    History of Present Illness:    Megan Espinoza is a 68 y.o. female seen at the request of Damian Christians NP for evaluation of atrial fibrillation, CHF,  and aortic stenosis. She was initially diagnosed with AFib in July 2024 on a visit with her PCP. She was sent to ED byut  had converted. She was started on Xarelto  and metoprolol . She was admitted Sept 24, 2024 with acute GI bleed with HGB of 7 and acute ischemic stroke. She was transfused. Xarelto  held. Had panendoscopy showing pyloric ulcers and gastritis. Large hiatal hernia. One small colon polyp. She had a right frontal coronal radiata CVA managed medically. Recommended switching to Eliquis  on DC. Echo showed normal LV function with LAE, moderate AS. Hgb at DC was 7.9.   She was readmitted 10/31-11/1/24 with acute SOB and edema. She was found to have acute pulmonary edema and diuresed with IV lasix . Was DC on lasix  40 mg daily. Her metoprolol  dose was increased. She had developed hives on Eliquis  so was switched back to Xarelto . Hgb was still low at 7.6. on Protonix  twice a day. Now on oral iron therapy  On follow up today she is doing well. No recurrent bleeding and Hgb has normalized. No palpitations, dyspnea, chest pain or edema. Is interested in [place] device.   Past Medical History:  Diagnosis Date   Acute ischemic stroke (HCC) 01/13/2023   Aortic stenosis    ARDS (adult respiratory distress syndrome) (HCC) 03/28/2012   Heart murmur    Hypertension    Influenza, pneumonia 03/20/2012   Associated with ARDS, Septic shock   Obesity (BMI 30.0-34.9)    Symptomatic anemia 03/24/2012    Past Surgical History:   Procedure Laterality Date   BIOPSY  01/15/2023   Procedure: BIOPSY;  Surgeon: Federico Rosario BROCKS, MD;  Location: [facility] ENDOSCOPY;  Service: Gastroenterology;;   CHOLECYSTECTOMY     CHOLECYSTECTOMY     COLONOSCOPY WITH PROPOFOL  N/A 01/15/2023   Procedure: COLONOSCOPY WITH PROPOFOL ;  Surgeon: Federico Rosario BROCKS, MD;  Location: [facility] ENDOSCOPY;  Service: Gastroenterology;  Laterality: N/A;   ESOPHAGOGASTRODUODENOSCOPY (EGD) WITH PROPOFOL  N/A 01/15/2023   Procedure: ESOPHAGOGASTRODUODENOSCOPY (EGD) WITH PROPOFOL ;  Surgeon: Federico Rosario BROCKS, MD;  Location: [facility] ENDOSCOPY;  Service: Gastroenterology;  Laterality: N/A;   HERNIA REPAIR     POLYPECTOMY  01/15/2023   Procedure: POLYPECTOMY;  Surgeon: Federico Rosario BROCKS, MD;  Location: [facility] ENDOSCOPY;  Service: Gastroenterology;;   TUBAL LIGATION      Current Medications: Current Meds  Medication Sig   acetaminophen  (TYLENOL ) 500 MG tablet Take 1,000 mg by mouth daily.   amLODipine  (NORVASC ) 10 MG tablet Take 10 mg by mouth daily.   atorvastatin  (LIPITOR) 40 MG tablet Take 1 tablet (40 mg total) by mouth daily.   cyclobenzaprine  (FLEXERIL ) 10 MG tablet Take 10 mg by mouth at bedtime.   ferrous sulfate  325 (65 FE) MG EC tablet Take 1 tablet (325 mg total) by mouth every other day. Follow iron deficiency with PCP, follow with PCP for refills. (Patient taking differently: Take 325 mg by mouth daily.)   furosemide  (LASIX ) 40 MG  tablet Take 40 mg by mouth every morning.   pantoprazole  (PROTONIX ) 40 MG tablet Take 1 tablet (40 mg total) by mouth daily.   potassium chloride  SA (KLOR-CON  M20) 20 MEQ tablet TAKE 1 TABLET BY MOUTH DAILY. CHECK LABS WITH YOUR PCP WITHIN 1 WEEK (KIDNEY FUNCTION/ELECTROLYTES)   rivaroxaban  (XARELTO ) 20 MG TABS tablet Take 20 mg by mouth daily with supper.   [DISCONTINUED] metoprolol  succinate (TOPROL -XL) 50 MG 24 hr tablet Take 1 tablet (50 mg total) by mouth daily. Take with or immediately following a meal.     Allergies:   Eliquis  [apixaban ],  Penicillin g, Penicillins, Empagliflozin, Erythromycin, Codeine, and Sulfa  antibiotics   Social History   Socioeconomic History   Marital status: Widowed    Spouse name: Not on file   Number of children: Not on file   Years of education: Not on file   Highest education level: Not on file  Occupational History   Not on file  Tobacco Use   Smoking status: Never   Smokeless tobacco: Never  Vaping Use   Vaping status: Never Used  Substance and Sexual Activity   Alcohol use: Yes    Comment: occasional   Drug use: No   Sexual activity: Not Currently    Partners: Male    Comment: married  Other Topics Concern   Not on file  Social History Narrative   Not on file   Social Drivers of Health   Financial Resource Strain: Not on file  Food Insecurity: No Food Insecurity (02/17/2023)   Hunger Vital Sign    Worried About Running Out of Food in the Last Year: Never true    Ran Out of Food in the Last Year: Never true  Transportation Needs: No Transportation Needs (02/17/2023)   PRAPARE - Administrator, Civil Service (Medical): No    Lack of Transportation (Non-Medical): No  Physical Activity: Not on file  Stress: Not on file  Social Connections: Not on file     Family History: The patient's family history includes Hypertension in an other family member; Lung cancer in her father; Pancreatic cancer in her mother. There is no history of Colon cancer, Esophageal cancer, Rectal cancer, or Stomach cancer.  ROS:   Please see the history of present illness.     All other systems reviewed and are negative.  EKGs/Labs/Other Studies Reviewed:    The following studies were reviewed today:      Echo 01/14/23: IMPRESSIONS     1. Left ventricular ejection fraction, by estimation, is 60 to 65%. The  left ventricle has normal function. The left ventricle has no regional  wall motion abnormalities. Left ventricular diastolic parameters were  normal.   2. Right ventricular  systolic function is normal. The right ventricular  size is normal.   3. Left atrial size was moderately dilated.   4. The mitral valve is degenerative. Mild mitral valve regurgitation. No  evidence of mitral stenosis. Moderate mitral annular calcification.   5. The aortic valve is normal in structure. Aortic valve regurgitation is  not visualized. Moderate aortic valve stenosis. Aortic valve area, by VTI  measures 1.29 cm. Aortic valve mean gradient measures 19.0 mmHg. Aortic  valve Vmax measures 2.88 m/s.   6. The inferior vena cava is dilated in size with >50% respiratory  variability, suggesting right atrial pressure of 8 mmHg.   7. Agitated saline contrast bubble study was negative, with no evidence  of any interatrial shunt.  Recent Labs: 01/13/2023: TSH 5.761 02/17/2023: B Natriuretic Peptide 168.7 02/18/2023: Magnesium  2.0 10/01/2023: ALT 14; BUN 23; Creatinine, Ser 1.11; Hemoglobin 13.6; Platelets 188.0; Potassium 4.0; Sodium 143  Recent Lipid Panel    Component Value Date/Time   CHOL 134 01/13/2023 0338   TRIG 106 01/13/2023 0338   HDL 35 (L) 01/13/2023 0338   CHOLHDL 3.8 01/13/2023 0338   VLDL 21 01/13/2023 0338   LDLCALC 78 01/13/2023 0338     Risk Assessment/Calculations:    CHA2DS2-VASc Score = 6   This indicates a 9.7% annual risk of stroke. The patient's score is based upon: CHF History: 1 HTN History: 1 Diabetes History: 0 Stroke History: 2 Vascular Disease History: 0 Age Score: 1 Gender Score: 1       Physical Exam:    VS:  BP (!) 149/85 (BP Location: Left Arm, Patient Position: Sitting)   Pulse 90   Ht 5' 4 (1.626 m)   Wt 290 lb 12.8 oz (131.9 kg)   SpO2 94%   BMI 49.92 kg/m     Wt Readings from Last 3 Encounters:  12/17/23 290 lb 12.8 oz (131.9 kg)  10/01/23 282 lb 2 oz (128 kg)  05/25/23 278 lb 12.8 oz (126.5 kg)     GEN:  Well nourished, morbidly obese,  in no acute distress HEENT: Normal NECK: No JVD; No carotid  bruits LYMPHATICS: No lymphadenopathy CARDIAC: RRR with extrasystoles,  gr 2/6 harsh systolic murmur RUSB >> apex.  RESPIRATORY:  Clear to auscultation without rales, wheezing or rhonchi  ABDOMEN: Soft, non-tender, non-distended MUSCULOSKELETAL: no  edema; No deformity  SKIN: Warm and dry NEUROLOGIC:  Alert and oriented x 3 PSYCHIATRIC:  Normal affect   ASSESSMENT:    1. Paroxysmal atrial fibrillation (HCC)   2. Moderate aortic stenosis - mean gradient 19.0 mmHg. peak gradient 33.2 mmHg   3. Chronic diastolic CHF (congestive heart failure) (HCC)   4. Iron deficiency anemia, unspecified iron deficiency anemia type     PLAN:    In order of problems listed above:  Paroxysmal AFib. HR is regular today. Will continue rate control with Toprol  XL and anticoagulation with Xarelto . If recurrent Afib would consider for ablation. Given history of major GI bleed will refer to EP to consider Watchman device Chronic diastolic CHF. Appears euvolemic today.  Continue lasix  40 mg daily with potassium supplement. Sodium restriction. Monitor weights and edema.  Moderate AS. Stable on exam. Will repeat Echo now.  Severe iron deficiency anemia. On iron every other day.  Morbid obesity.  HTN  under fair control.          Medication Adjustments/Labs and Tests Ordered: Current medicines are reviewed at length with the patient today.  Concerns regarding medicines are outlined above.  No orders of the defined types were placed in this encounter.  Meds ordered this encounter  Medications   metoprolol  succinate (TOPROL -XL) 50 MG 24 hr tablet    Sig: Take 1 tablet (50 mg total) by mouth daily. Take with or immediately following a meal.    Dispense:  90 tablet    Refill:  3    There are no Patient Instructions on file for this visit.   Signed, Samora Jernberg [place], MD  12/17/2023 1:27 PM    [facility]

## 2023-12-17 ENCOUNTER — Encounter: Payer: Self-pay | Admitting: Cardiology

## 2023-12-17 ENCOUNTER — Ambulatory Visit: Attending: Cardiology | Admitting: Cardiology

## 2023-12-17 VITALS — BP 149/85 | HR 90 | Ht 64.0 in | Wt 290.8 lb

## 2023-12-17 DIAGNOSIS — I350 Nonrheumatic aortic (valve) stenosis: Secondary | ICD-10-CM | POA: Diagnosis not present

## 2023-12-17 DIAGNOSIS — I5032 Chronic diastolic (congestive) heart failure: Secondary | ICD-10-CM | POA: Diagnosis not present

## 2023-12-17 DIAGNOSIS — I480 Paroxysmal atrial fibrillation: Secondary | ICD-10-CM | POA: Diagnosis not present

## 2023-12-17 DIAGNOSIS — D509 Iron deficiency anemia, unspecified: Secondary | ICD-10-CM | POA: Diagnosis not present

## 2023-12-17 MED ORDER — METOPROLOL SUCCINATE ER 50 MG PO TB24: 50.0000 mg | ORAL_TABLET | Freq: Every day | ORAL | 3 refills | Status: AC

## 2023-12-17 NOTE — Patient Instructions (Addendum)
 Medication Instructions:  Continue same medications *If you need a refill on your cardiac medications before your next appointment, please call your pharmacy*  Lab Work: None ordered  Testing/Procedures: Echo   First available   Follow-Up: At [facility], you and your health needs are our priority.  As part of our continuing mission to provide you with exceptional heart care, our providers are all part of one team.  This team includes your primary Cardiologist (physician) and Advanced Practice Providers or APPs (Physician Assistants and Nurse Practitioners) who all work together to provide you with the care you need, when you need it.  Your next appointment:  6 months   Call in Oct to schedule Feb appointment     Provider:  Dr.Jordan    EP will be calling with appointment to talk about a Watchman Device    We recommend signing up for the patient portal called MyChart.  Sign up information is provided on this After Visit Summary.  MyChart is used to connect with patients for Virtual Visits (Telemedicine).  Patients are able to view lab/test results, encounter notes, upcoming appointments, etc.  Non-urgent messages can be sent to your provider as well.   To learn more about what you can do with MyChart, go to [email or web address].

## 2024-01-07 ENCOUNTER — Institutional Professional Consult (permissible substitution): Admitting: Student in an Organized Health Care Education/Training Program

## 2024-01-25 ENCOUNTER — Ambulatory Visit: Payer: Self-pay | Admitting: Cardiology

## 2024-01-25 ENCOUNTER — Ambulatory Visit (HOSPITAL_COMMUNITY)
Admission: RE | Admit: 2024-01-25 | Discharge: 2024-01-25 | Disposition: A | Discharge status: Home / Self Care | Source: Ambulatory Visit | Attending: Student in an Organized Health Care Education/Training Program | Admitting: Student in an Organized Health Care Education/Training Program

## 2024-01-25 DIAGNOSIS — I480 Paroxysmal atrial fibrillation: Secondary | ICD-10-CM | POA: Insufficient documentation

## 2024-01-25 DIAGNOSIS — I5032 Chronic diastolic (congestive) heart failure: Secondary | ICD-10-CM | POA: Insufficient documentation

## 2024-01-25 DIAGNOSIS — I350 Nonrheumatic aortic (valve) stenosis: Secondary | ICD-10-CM | POA: Diagnosis present

## 2024-01-25 DIAGNOSIS — D509 Iron deficiency anemia, unspecified: Secondary | ICD-10-CM | POA: Diagnosis present

## 2024-01-25 LAB — ECHOCARDIOGRAM COMPLETE
AR max vel: 1.37 cm2
AV Area VTI: 1.27 cm2
AV Area mean vel: 1.29 cm2
AV Mean grad: 20 mmHg
AV Peak grad: 33.2 mmHg
Ao pk vel: 2.88 m/s
Area-P 1/2: 3.13 cm2
S' Lateral: 2.9 cm

## 2024-02-08 ENCOUNTER — Ambulatory Visit: Admitting: Cardiology

## 2024-02-11 ENCOUNTER — Encounter: Payer: Self-pay | Admitting: Cardiology

## 2024-02-14 NOTE — Progress Notes (Signed)
 Electrophysiology Office Note:   Date:  02/18/2024  ID:  Megan Espinoza, DOB 27-Nov-1955, MRN [id number]  Primary Cardiologist: None Electrophysiologist: Megan Kitty, MD      History of Present Illness:   Megan Espinoza is a 68 y.o. female with h/o paroxysmal atrial fibrillation, stroke, GI bleeding, chronic diastolic heart failure, moderate aortic stenosis, hypertension who is being seen today for evaluation for Watchman device at the request of Dr. Jordan.  She was initially diagnosed with AFib in July 2024 on a visit with her PCP. She was sent to ED but had converted. She was started on Xarelto  and metoprolol . She was admitted Sept 24, 2024 with acute GI bleed with HGB of 7 and acute ischemic stroke. She was transfused. Xarelto  held. Had pan-endoscopy showing pyloric ulcers and gastritis. Large hiatal hernia. One small colon polyp. She had a right frontal coronal radiata CVA managed medically.   Discussed the use of AI scribe software for clinical note transcription with the patient, who gave verbal consent to proceed.  History of Present Illness Megan Espinoza is a 68 year old female with atrial fibrillation and a history of gastrointestinal bleeding who presents for evaluation of the Watchman device. She was referred by Dr. Jordan for evaluation of the Watchman device due to her history of GI bleeding and atrial fibrillation.  She has a history of atrial fibrillation, first identified approximately a year ago. She is asymptomatic and unaware of her episodes, stating, 'I didn't know I was even having it.' She has been on Xarelto  to reduce her stroke risk, as she has had a previous stroke. She does not experience frequent episodes of atrial fibrillation that she is aware of, and her heart rate monitor does not indicate frequent high heart rates.  She has a history of gastrointestinal bleeding, which occurred while taking ibuprofen. Since discontinuing ibuprofen, no further  bleeding episodes have occurred. The GI bleed and stroke occurred simultaneously, leading to her hospitalization where she was started on Xarelto . A GI scope revealed ulceration and gastritis.  Her current medication regimen includes Xarelto , which she has been tolerating without further bleeding since discontinuing ibuprofen. She is concerned about the bleeding risk associated with Xarelto , especially given her previous GI bleed and stroke.  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  Otherwise doing relatively well.  No new or acute complaints.    Review of systems complete and found to be negative unless listed in HPI.   EP Information / Studies Reviewed:    EKG is not ordered today. EKG from 02/22/23 reviewed which showed sinus with PACs     ECG 02/18/23: AF   Echo 01/25/24:  1. Left ventricular ejection fraction, by estimation, is 60 to 65%. The  left ventricle has normal function. The left ventricle has no regional  wall motion abnormalities. There is moderate concentric left ventricular  hypertrophy. Left ventricular  diastolic parameters are indeterminate.   2. Right ventricular systolic function is normal. The right ventricular  size is normal. Tricuspid regurgitation signal is inadequate for assessing  PA pressure.   3. Mitral Valve Area (MVA) by continuity equation is approximately 1.87  cm. The mitral valve is grossly normal. No evidence of mitral valve  regurgitation. Mild mitral stenosis. The mean mitral valve gradient is 4.0  mmHg. Moderate mitral annular  calcification.   4. Valve is visualize calcified; nominal decrease in stroke volume index  with DVI 0.40, Pedoff assessement performed; a component of low flow lof  gradient aortic stenosis is present. The aortic valve is calcified. Aortic  valve regurgitation is not  visualized. Mild to moderate aortic valve stenosis. Aortic valve area,  by  VTI measures 1.27 cm. Aortic valve mean gradient measures 20.0 mmHg.  Aortic valve Vmax measures 2.88 m/s. Aortic valve acceleration time  measures 70 msec.   5. The inferior vena cava is dilated in size with <50% respiratory  variability, suggesting right atrial pressure of 15 mmHg.   Risk Assessment/Calculations:    CHA2DS2-VASc Score = 6   This indicates a 9.7% annual risk of stroke. The patient's score is based upon: CHF History: 1 HTN History: 1 Diabetes History: 0 Stroke History: 2 Vascular Disease History: 0 Age Score: 1 Gender Score: 1         Physical Exam:   VS:  BP (!) 166/85 (BP Location: Right Arm, Patient Position: Sitting, Cuff Size: Large)   Pulse 96   Ht 5' 4 (1.626 m)   Wt 292 lb (132.5 kg)   SpO2 95%   BMI 50.12 kg/m    Wt Readings from Last 3 Encounters:  02/15/24 292 lb (132.5 kg)  12/17/23 290 lb 12.8 oz (131.9 kg)  10/01/23 282 lb 2 oz (128 kg)     GEN: Well nourished, well developed in no acute distress NECK: No JVD CARDIAC: Normal rate, regular rhythm RESPIRATORY:  Clear to auscultation without rales, wheezing or rhonchi  ABDOMEN: Soft, non-distended EXTREMITIES:  No edema; No deformity   ASSESSMENT AND PLAN:   I have seen Megan Espinoza in the office today who is being considered for a Watchman left atrial appendage closure device. I believe they will benefit from this procedure given their history of atrial fibrillation, CHA2DS2-VASc score of 6 and unadjusted ischemic stroke rate of 9.7% per year. Unfortunately, the patient is not felt to be a long term anticoagulation candidate secondary to GI bleeding and chronic anemia. The patient's chart has been reviewed and I feel that they would be a candidate for short term oral anticoagulation after Watchman implant.   It is my belief that after undergoing a LAA closure procedure, Megan Espinoza will not need long term anticoagulation which eliminates anticoagulation side effects and  major bleeding risk.   Procedural risks for the Watchman implant have been reviewed with the patient including a 0.5% risk of stroke, <1% risk of perforation and <1% risk of device embolization. Other risks include bleeding, vascular damage, tamponade, worsening renal function, and death. The patient understands these risk and wishes to proceed.     The published clinical data on the safety and effectiveness of WATCHMAN include but are not limited to the following: - Holmes DR, Jess BEARD, Sick P et al. for the PROTECT AF Investigators. Percutaneous closure of the left atrial appendage versus warfarin therapy for prevention of stroke in patients with atrial fibrillation: a randomised non-inferiority trial. Lancet 2009; 374: 534-42. GLENWOOD Jess BEARD, Doshi SK, Jonita VEAR Satchel D et al. on behalf of the PROTECT AF Investigators. Percutaneous Left Atrial Appendage Closure for Stroke Prophylaxis in Patients With Atrial Fibrillation 2.3-Year Follow-up of the PROTECT AF (Watchman Left Atrial Appendage System for Embolic Protection in Patients With Atrial Fibrillation) Trial. Circulation 2013; 127:720-729. - Alli O, Doshi S,  Kar S, Reddy VY, Sievert H et al. Quality of Life Assessment in the Randomized PROTECT AF (Percutaneous Closure of the Left Atrial Appendage Versus Warfarin Therapy  for Prevention of Stroke in Patients With Atrial Fibrillation) Trial of Patients at Risk for Stroke With Nonvalvular Atrial Fibrillation. J Am Coll Cardiol 2013; 61:1790-8. GLENWOOD Satchel DR, Archer RAMAN, Price M, Whisenant B, Sievert H, Doshi S, Huber K, Reddy V. Prospective randomized evaluation of the Watchman left atrial appendage Device in patients with atrial fibrillation versus long-term warfarin therapy; the PREVAIL trial. Journal of the [employer] of Cardiology, Vol. 4, No. 1, 2014, 1-11. - Kar S, Doshi SK, Sadhu A, Horton R, Osorio J et al. Primary outcome evaluation of a next-generation left atrial appendage closure device: results  from the PINNACLE FLX trial. Circulation 2021;143(18)1754-1762.   HAS-BLED score 4 Hypertension Yes  Abnormal renal and liver function (Dialysis, transplant, Cr >2.26 mg/dL /Cirrhosis or Bilirubin >2x Normal or AST/ALT/AP >3x Normal) No  Stroke Yes  Bleeding Yes  Labile INR (Unstable/high INR) No  Elderly (>65) Yes  Drugs or alcohol (>= 8 drinks/week, anti-plt or NSAID) No   CHA2DS2-VASc Score = 6  The patient's score is based upon: CHF History: 1 - NYHA class II HTN History: 1 Diabetes History: 0 Stroke History: 2 Vascular Disease History: 0 Age Score: 1 Gender Score: 1       ASSESSMENT AND PLAN: BMI is currently prohibitive for Watchman device implant.  We discussed weight loss and reevaluation in the future.  She will remain off NSAIDs and continue oral anticoagulation.  Paroxysmal Atrial Fibrillation (ICD10:  I48.0) The patient's CHA2DS2-VASc score is 6, indicating a 9.7% annual risk of stroke.   Secondary Hypercoagulable State (ICD10:  D68.69){ - Continue Xarelto  for stroke prevention. - Continue to avoid NSAIDs to prevent GI bleeding. - Encouraged weight loss to reduce procedural risk. - Will reassess in six months for potential Watchman procedure.   Follow up with EP APP in 6 months  Signed, Megan Kitty, MD

## 2024-02-15 ENCOUNTER — Ambulatory Visit: Attending: Cardiology | Admitting: Cardiology

## 2024-02-15 ENCOUNTER — Encounter: Payer: Self-pay | Admitting: Cardiology

## 2024-02-15 VITALS — BP 166/85 | HR 96 | Ht 64.0 in | Wt 292.0 lb

## 2024-02-15 DIAGNOSIS — K922 Gastrointestinal hemorrhage, unspecified: Secondary | ICD-10-CM | POA: Diagnosis not present

## 2024-02-15 DIAGNOSIS — I5032 Chronic diastolic (congestive) heart failure: Secondary | ICD-10-CM

## 2024-02-15 DIAGNOSIS — I480 Paroxysmal atrial fibrillation: Secondary | ICD-10-CM

## 2024-02-15 DIAGNOSIS — D6869 Other thrombophilia: Secondary | ICD-10-CM

## 2024-02-15 NOTE — Patient Instructions (Signed)
 Medication Instructions:  Your physician recommends that you continue on your current medications as directed. Please refer to the Current Medication list given to you today.   *If you need a refill on your cardiac medications before your next appointment, please call your pharmacy*  Follow-Up: At [facility], you and your health needs are our priority.  As part of our continuing mission to provide you with exceptional heart care, our providers are all part of one team.  This team includes your primary Cardiologist (physician) and Advanced Practice Providers or APPs (Physician Assistants and Nurse Practitioners) who all work together to provide you with the care you need, when you need it.  Your next appointment:   6 months  Provider:   You may see Fonda Kitty, MD or one of the following Advanced Practice Providers on your designated Care Team:   Charlies Arthur, PA-C Michael Andy Tillery, PA-C Suzann Riddle, NP Daphne Barrack, NP Artist Pouch, PA-C

## 2024-02-21 ENCOUNTER — Encounter: Payer: Self-pay | Admitting: Radiology

## 2024-02-21 ENCOUNTER — Other Ambulatory Visit: Payer: Self-pay

## 2024-02-22 ENCOUNTER — Other Ambulatory Visit: Payer: Self-pay | Admitting: Cardiology

## 2024-02-22 ENCOUNTER — Institutional Professional Consult (permissible substitution): Admitting: Cardiology

## 2024-04-27 ENCOUNTER — Telehealth: Payer: Self-pay

## 2024-04-27 NOTE — Telephone Encounter (Signed)
 Called patient to scheduled routine f/u with EP APP 08/22/2024 per Dr. Shaune last OV note.

## 2024-05-25 ENCOUNTER — Encounter: Payer: Self-pay | Admitting: Internal Medicine

## 2024-05-25 ENCOUNTER — Ambulatory Visit: Admitting: Internal Medicine

## 2024-05-25 VITALS — BP 148/66 | HR 99 | Ht 64.0 in | Wt 282.2 lb

## 2024-05-25 DIAGNOSIS — Z8719 Personal history of other diseases of the digestive system: Secondary | ICD-10-CM

## 2024-05-25 DIAGNOSIS — K219 Gastro-esophageal reflux disease without esophagitis: Secondary | ICD-10-CM

## 2024-05-25 DIAGNOSIS — K2270 Barrett's esophagus without dysplasia: Secondary | ICD-10-CM

## 2024-05-25 DIAGNOSIS — K5900 Constipation, unspecified: Secondary | ICD-10-CM | POA: Diagnosis not present

## 2024-05-25 DIAGNOSIS — K449 Diaphragmatic hernia without obstruction or gangrene: Secondary | ICD-10-CM

## 2024-05-25 DIAGNOSIS — Z8711 Personal history of peptic ulcer disease: Secondary | ICD-10-CM | POA: Diagnosis not present

## 2024-05-25 DIAGNOSIS — D509 Iron deficiency anemia, unspecified: Secondary | ICD-10-CM

## 2024-05-25 DIAGNOSIS — E611 Iron deficiency: Secondary | ICD-10-CM

## 2024-05-25 MED ORDER — POLYETHYLENE GLYCOL 3350 17 G PO PACK: 17.0000 g | PACK | Freq: Every day | ORAL | Status: AC | PRN

## 2024-05-25 MED ORDER — BENEFIBER PO POWD: ORAL | Status: AC

## 2024-05-25 NOTE — Progress Notes (Signed)
 "  Chief Complaint: Iron deficiency, constipation  HPI : 69 year old female with history of A-fib on Xarelto , HFpEF, aortic stenosis, CVA in 12/2022, obesity, prior gastric ulcer, hiatal hernia, and prior Ole erosions presents for follow up of iron deficiency and constipation  Patient was admitted 9/24 to 01/15/23 for an acute CVA.  At that time she was found to have significant iron deficiency anemia and underwent EGD and colonoscopy at that time.  Her IDA was attributed to gastric ulcers and Ole erosions that were found and she was started on PPI twice daily therapy, which was subsequently weaned down to every day.  Interval History: She was started on Wegovy a few months ago and this has been slowly uptitrated over time. She is on [facility] currently on 0.5 mg weekly. This has caused her to become more constipated. She is pooping 1-2 times per week on average currently. She takes Miralax  and Dulcolax on occasion. She is still working so she is not able to take the Miralax /Dulcolax daily. Endorses use of docusate every day. She has been losing weight on the [facility]. Endorses nausea. She has Zofran  on hand and will take it on occasion. Denies vomiting. Denies blood in the stools. She has continued to take her PPI daily. Currently she takes her oral iron supplement three times weekly. She is currently on Xarelto . Denies blood in the stools. She is considering the Watchman procedure.  Wt Readings from Last 3 Encounters:  05/25/24 282 lb 4 oz (128 kg)  02/15/24 292 lb (132.5 kg)  12/17/23 290 lb 12.8 oz (131.9 kg)    Past Medical History:  Diagnosis Date   Acute ischemic stroke (HCC) 01/13/2023   Aortic stenosis    ARDS (adult respiratory distress syndrome) (HCC) 03/28/2012   Arrhythmia    CHF (congestive heart failure) (HCC)    Heart murmur    Hypertension    Influenza, pneumonia 03/20/2012   Associated with ARDS, Septic shock   Obesity (BMI 30.0-34.9)    Symptomatic anemia 03/24/2012      Past Surgical History:  Procedure Laterality Date   BIOPSY  01/15/2023   Procedure: BIOPSY;  Surgeon: Federico Rosario BROCKS, MD;  Location: [facility] ENDOSCOPY;  Service: Gastroenterology;;   CHOLECYSTECTOMY     CHOLECYSTECTOMY     COLONOSCOPY WITH PROPOFOL  N/A 01/15/2023   Procedure: COLONOSCOPY WITH PROPOFOL ;  Surgeon: Federico Rosario BROCKS, MD;  Location: [facility] ENDOSCOPY;  Service: Gastroenterology;  Laterality: N/A;   ESOPHAGOGASTRODUODENOSCOPY (EGD) WITH PROPOFOL  N/A 01/15/2023   Procedure: ESOPHAGOGASTRODUODENOSCOPY (EGD) WITH PROPOFOL ;  Surgeon: Federico Rosario BROCKS, MD;  Location: [facility] ENDOSCOPY;  Service: Gastroenterology;  Laterality: N/A;   HERNIA REPAIR     POLYPECTOMY  01/15/2023   Procedure: POLYPECTOMY;  Surgeon: Federico Rosario BROCKS, MD;  Location: [facility] ENDOSCOPY;  Service: Gastroenterology;;   TUBAL LIGATION     Family History  Problem Relation Age of Onset   Pancreatic cancer Mother    Anemia Mother    Lung cancer Father    Hypertension Other    Colon cancer Neg Hx    Esophageal cancer Neg Hx    Rectal cancer Neg Hx    Stomach cancer Neg Hx    Social History   Tobacco Use   Smoking status: Never   Smokeless tobacco: Never  Vaping Use   Vaping status: Never Used  Substance Use Topics   Alcohol use: Yes    Comment: occasional   Drug use: No   Current Outpatient Medications  Medication Sig Dispense  Refill   acetaminophen  (TYLENOL ) 500 MG tablet Take 1,000 mg by mouth daily.     amLODipine  (NORVASC ) 10 MG tablet Take 10 mg by mouth daily.     atorvastatin  (LIPITOR) 40 MG tablet TAKE 1 TABLET BY MOUTH EVERY DAY 90 tablet 2   cyclobenzaprine  (FLEXERIL ) 10 MG tablet Take 10 mg by mouth at bedtime.     ferrous sulfate  325 (65 FE) MG EC tablet Take 1 tablet (325 mg total) by mouth every other day. Follow iron deficiency with PCP, follow with PCP for refills. (Patient taking differently: Take 325 mg by mouth daily.) 15 tablet 0   furosemide  (LASIX ) 40 MG tablet TAKE 1/2 TABLET(20 MG) BY MOUTH  DAILY 45 tablet 3   losartan (COZAAR) 25 MG tablet Take 25 mg by mouth daily.     metFORMIN (GLUCOPHAGE) 500 MG tablet Take 500 mg by mouth 2 (two) times daily.     metoprolol  succinate (TOPROL -XL) 50 MG 24 hr tablet Take 1 tablet (50 mg total) by mouth daily. Take with or immediately following a meal. 90 tablet 3   ondansetron  (ZOFRAN -ODT) 4 MG disintegrating tablet Take by mouth.     pantoprazole  (PROTONIX ) 40 MG tablet Take 1 tablet (40 mg total) by mouth daily. 90 tablet 3   Potassium Chloride  ER 20 MEQ TBCR Take 1 tablet by mouth daily.     potassium chloride  SA (KLOR-CON  M20) 20 MEQ tablet TAKE 1 TABLET BY MOUTH DAILY. CHECK LABS WITH YOUR PCP WITHIN 1 WEEK (KIDNEY FUNCTION/ELECTROLYTES) 30 tablet 3   rivaroxaban  (XARELTO ) 20 MG TABS tablet Take 20 mg by mouth daily with supper.     topiramate (TOPAMAX) 25 MG tablet Take 25 mg by mouth 2 (two) times daily.     WEGOVY 0.5 MG/0.5ML SOAJ SQ injection INJECT 0.5 MG BY SUBCUTANEOUS ROUTE ONCE A WEEK FOR 4 WEEKS THEN INCREASE TO 1 MG     No current facility-administered medications for this visit.   Allergies  Allergen Reactions   Eliquis  [Apixaban ] Anaphylaxis   Penicillin G Shortness Of Breath   Penicillins Swelling   Empagliflozin Dermatitis    Severe yeast infection  Other Reaction(s): Not available  empagliflozin   Erythromycin Rash and Swelling    Mouth thrush- per pt  Other Reaction(s): Not available   Codeine Nausea And Vomiting   Sulfa  Antibiotics Rash    Mouth thrush- per pt    Physical Exam: BP (!) 148/66   Pulse 99   Ht 5' 4 (1.626 m)   Wt 282 lb 4 oz (128 kg)   BMI 48.45 kg/m  Constitutional: Pleasant,well-developed, female in no acute distress. HEENT: Normocephalic and atraumatic. Conjunctivae are normal. No scleral icterus. Cardiovascular: Regular rate, systolic murmur Pulmonary/chest: Effort normal and breath sounds normal. No wheezing, rales or rhonchi. Abdominal: Soft, nondistended, nontender. Bowel  sounds active throughout. There are no masses palpable. No hepatomegaly. Extremities: No bilateral lower extremity edema Neurological: Alert and oriented to person place and time. Skin: Skin is warm and dry. No rashes noted. Psychiatric: Normal mood and affect. Behavior is normal.  Labs 02/2023: CBC with low hemoglobin of 7.6 and MCV of 77.7.  BMP with elevated creatinine of 1.07.  Labs 09/2023: CBC nml. Ferritin/IBC with low iron sat of 14.6%. CMP unremarkable.  EGD 01/15/23: Salmon- colored mucosa was present. The maximum longitudinal extent of these esophageal mucosal changes was 2 cm in length. Mucosa was biopsied with a cold forceps for histology. One specimen bottle was sent to pathology. A  large hiatal hernia was present. Multiple localized erosions with no bleeding and no stigmata of recent bleeding were found in the cardia. Three non- bleeding linear gastric ulcers with a clean ulcer base ( Forrest Class III) were found at the pylorus. The largest lesion was 8 mm in largest dimension. Biopsies were taken near the ulcers and from the gastric antrum, pylorus, fundus, and cardia with a cold forceps for Helicobacter pylori testing. The examined duodenum was normal. Biopsies were taken with a cold forceps for histology. Path: A. DUODENUM, BIOPSY:  Duodenal mucosa with normal villous architecture.  No villous atrophy or increased intraepithelial lymphocytes.  B. STOMACH, BIOPSY:  Gastric antral and oxyntic mucosa with changes consistent with reactive  gastropathy.  Negative for Helicobacter pylori.  C. ESOPHAGUS, DISTAL, BIOPSY:  Gastroesophageal mucosa with erosion and inflammation consistent with reflux.  Negative for intestinal metaplasia or dysplasia.   Colonoscopy 01/15/23: - The examined portion of the ileum was normal. - Diverticulosis in the sigmoid colon and in the descending colon. - One 4 mm polyp in the sigmoid colon, removed with a cold snare. Resected and retrieved. - Non-  bleeding internal hemorrhoids. Path: D. COLON, SIGMOID, POLYPECTOMY:  Hyperplastic polyp.   EGD 04/19/23: - White nummular lesions in esophageal mucosa. Biopsied. - 5 cm hiatal hernia. - Normal examined duodenum. - The examination was otherwise normal. Path: 1. Surgical [P], esophagus bx :       COMPATIBLE WITH BARRETT'S ESOPHAGUS       REACTIVE SQUAMOUS MUCOSA       MILD CHRONIC GASTRITIS WITH INTESTINAL METAPLASIA       NEGATIVE FOR DYSPLASIA AND CARCINOMA       NEGATIVE FOR ACUTE INFLAMMATION AND FUNGAL ORGANISMS   ASSESSMENT AND PLAN: Constipation Iron deficiency, previously anemic and attributed to gastric ulcer/Cameron erosions Large hiatal hernia Barrett's esophagus History of gastric ulcers History of Cameron erosions History of mildly elevated alkaline phosphatase Patient is here to talk about constipation issues that have appeared after she was started on [facility] therapy.  She is currently taking MiraLAX  and Dulcolax as needed along with docusate daily.  I encouraged her to start a daily fiber supplement.  Her physical activity is limited due to arthritis issues.  Will have her start daily MiraLAX  and titrate as needed.  Since her last set of labs showed that her anemia had resolved but that she was still slightly iron deficient, will have her stop her iron supplement for now and see if her anemia recurs in about 2 months. She continues on her PPI therapy - Continue pantoprazole  40 mg QD.  - Stop ferrous sulfate  - Start daily fiber supplement such as Benefiber or Metamucil - Start daily Miralax . If too much, then go to every other day. - EGD for Barrett's surveillance due in 03/2028 - RTC 2 months. Will plan to recheck blood counts and iron levels at that time  Estefana Kidney, MD  I spent 30 minutes of time, including in depth chart review, independent review of results as outlined above, communicating results with the patient directly, face-to-face time with the patient,  coordinating care, ordering studies and medications as appropriate, and documentation.  "

## 2024-05-25 NOTE — Patient Instructions (Addendum)
 Please purchase the following medications over the counter and take as directed: Miralax  - Take once a day. If needed, reduce to once every other day  Benefiber or Metamucil - Take once daily  Stop oral iron.  Please follow up in 2 months.  Thank you for entrusting me with your care and for choosing [facility], Dr. Estefana Kidney   _______________________________________________________  If your blood pressure at your visit was 140/90 or greater, please contact your primary care physician to follow up on this.  _______________________________________________________  If you are age 69 or older, your body mass index should be between 23-30. Your Body mass index is 48.45 kg/m. If this is out of the aforementioned range listed, please consider follow up with your Primary Care Provider.  If you are age 6 or younger, your body mass index should be between 19-25. Your Body mass index is 48.45 kg/m. If this is out of the aformentioned range listed, please consider follow up with your Primary Care Provider.   ________________________________________________________  The [facility] providers would like to encourage you to use MYCHART to communicate with providers for non-urgent requests or questions.  Due to long hold times on the telephone, sending your provider a message by [facility] may be a faster and more efficient way to get a response.  Please allow 48 business hours for a response.  Please remember that this is for non-urgent requests.  _______________________________________________________  [facility] is using a team-based approach to care.  Your team is made up of your doctor and two to three APPS. Our APPS (Nurse Practitioners and Physician Assistants) work with your physician to ensure care continuity for you. They are fully qualified to address your health concerns and develop a treatment plan. They communicate directly with your gastroenterologist to care for  you. Seeing the Advanced Practice Practitioners on your physician's team can help you by facilitating care more promptly, often allowing for earlier appointments, access to diagnostic testing, procedures, and other specialty referrals.   Due to recent changes in healthcare laws, you may see the results of your imaging and laboratory studies on MyChart before your provider has had a chance to review them.  We understand that in some cases there may be results that are confusing or concerning to you. Not all laboratory results come back in the same time frame and the provider may be waiting for multiple results in order to interpret others.  Please give us  48 hours in order for your provider to thoroughly review all the results before contacting the office for clarification of your results.

## 2024-06-15 ENCOUNTER — Ambulatory Visit: Admitting: Cardiology

## 2024-08-22 ENCOUNTER — Ambulatory Visit: Admitting: Physician Assistant
